# Patient Record
Sex: Male | Born: 1955
Health system: Southern US, Community
[De-identification: ages and names within clinical notes are randomized; demographics above are authoritative.]

## PROBLEM LIST (undated history)

## (undated) DIAGNOSIS — R42 Dizziness and giddiness: Secondary | ICD-10-CM

## (undated) DIAGNOSIS — Z87442 Personal history of urinary calculi: Secondary | ICD-10-CM

## (undated) DIAGNOSIS — M1990 Unspecified osteoarthritis, unspecified site: Secondary | ICD-10-CM

## (undated) DIAGNOSIS — E119 Type 2 diabetes mellitus without complications: Secondary | ICD-10-CM

## (undated) DIAGNOSIS — R011 Cardiac murmur, unspecified: Secondary | ICD-10-CM

## (undated) DIAGNOSIS — Z8719 Personal history of other diseases of the digestive system: Secondary | ICD-10-CM

## (undated) DIAGNOSIS — E785 Hyperlipidemia, unspecified: Secondary | ICD-10-CM

## (undated) DIAGNOSIS — K219 Gastro-esophageal reflux disease without esophagitis: Secondary | ICD-10-CM

## (undated) DIAGNOSIS — I10 Essential (primary) hypertension: Secondary | ICD-10-CM

## (undated) DIAGNOSIS — F419 Anxiety disorder, unspecified: Secondary | ICD-10-CM

## (undated) DIAGNOSIS — G43909 Migraine, unspecified, not intractable, without status migrainosus: Secondary | ICD-10-CM

## (undated) HISTORY — PX: OTHER SURGICAL HISTORY: SHX169

## (undated) HISTORY — PX: COLONOSCOPY: SHX174

## (undated) HISTORY — DX: Migraine, unspecified, not intractable, without status migrainosus: G43.909

## (undated) HISTORY — DX: Essential (primary) hypertension: I10

## (undated) HISTORY — DX: Hyperlipidemia, unspecified: E78.5

## (undated) HISTORY — PX: JOINT REPLACEMENT: SHX530

## (undated) HISTORY — DX: Cardiac murmur, unspecified: R01.1

## (undated) HISTORY — DX: Dizziness and giddiness: R42

---

## 2002-03-22 ENCOUNTER — Ambulatory Visit (HOSPITAL_COMMUNITY): Admission: RE | Admit: 2002-03-22 | Discharge: 2002-03-22 | Payer: Self-pay | Admitting: Internal Medicine

## 2002-03-22 ENCOUNTER — Encounter: Payer: Self-pay | Admitting: Internal Medicine

## 2002-10-15 ENCOUNTER — Encounter: Admission: RE | Admit: 2002-10-15 | Discharge: 2002-10-15 | Payer: Self-pay | Admitting: Internal Medicine

## 2002-10-15 ENCOUNTER — Encounter: Payer: Self-pay | Admitting: Internal Medicine

## 2003-12-27 HISTORY — PX: COLONOSCOPY: SHX174

## 2004-03-12 ENCOUNTER — Encounter: Admission: RE | Admit: 2004-03-12 | Discharge: 2004-03-12 | Payer: Self-pay | Admitting: Internal Medicine

## 2004-10-20 ENCOUNTER — Emergency Department: Payer: Self-pay | Admitting: Emergency Medicine

## 2004-10-21 ENCOUNTER — Encounter: Payer: Self-pay | Admitting: Gastroenterology

## 2006-05-23 ENCOUNTER — Encounter: Admission: RE | Admit: 2006-05-23 | Discharge: 2006-05-23 | Payer: Self-pay | Admitting: Internal Medicine

## 2006-11-07 ENCOUNTER — Encounter: Admission: RE | Admit: 2006-11-07 | Discharge: 2006-11-07 | Payer: Self-pay | Admitting: Internal Medicine

## 2006-12-06 ENCOUNTER — Ambulatory Visit: Payer: Self-pay | Admitting: Gastroenterology

## 2007-11-20 ENCOUNTER — Encounter: Admission: RE | Admit: 2007-11-20 | Discharge: 2007-11-20 | Payer: Self-pay | Admitting: Internal Medicine

## 2007-11-21 ENCOUNTER — Encounter: Admission: RE | Admit: 2007-11-21 | Discharge: 2007-11-21 | Payer: Self-pay | Admitting: Internal Medicine

## 2007-12-27 DIAGNOSIS — I10 Essential (primary) hypertension: Secondary | ICD-10-CM

## 2007-12-27 HISTORY — DX: Essential (primary) hypertension: I10

## 2008-01-30 ENCOUNTER — Ambulatory Visit: Payer: Self-pay | Admitting: Gastroenterology

## 2008-10-17 ENCOUNTER — Ambulatory Visit: Payer: Self-pay | Admitting: Internal Medicine

## 2008-11-26 ENCOUNTER — Telehealth: Payer: Self-pay | Admitting: Gastroenterology

## 2008-11-27 ENCOUNTER — Ambulatory Visit: Payer: Self-pay | Admitting: Gastroenterology

## 2008-11-27 DIAGNOSIS — K5732 Diverticulitis of large intestine without perforation or abscess without bleeding: Secondary | ICD-10-CM | POA: Insufficient documentation

## 2009-01-04 ENCOUNTER — Telehealth: Payer: Self-pay | Admitting: Gastroenterology

## 2009-01-05 ENCOUNTER — Ambulatory Visit: Payer: Self-pay | Admitting: Gastroenterology

## 2009-01-06 ENCOUNTER — Encounter: Payer: Self-pay | Admitting: Gastroenterology

## 2009-01-06 ENCOUNTER — Ambulatory Visit: Payer: Self-pay | Admitting: Cardiovascular Disease

## 2009-01-06 ENCOUNTER — Telehealth: Payer: Self-pay | Admitting: Gastroenterology

## 2009-01-12 ENCOUNTER — Telehealth: Payer: Self-pay | Admitting: Gastroenterology

## 2009-01-28 ENCOUNTER — Ambulatory Visit: Payer: Self-pay | Admitting: Gastroenterology

## 2009-02-03 ENCOUNTER — Encounter: Payer: Self-pay | Admitting: Gastroenterology

## 2009-07-07 ENCOUNTER — Ambulatory Visit: Payer: Self-pay | Admitting: Internal Medicine

## 2009-08-04 ENCOUNTER — Ambulatory Visit: Payer: Self-pay | Admitting: Internal Medicine

## 2010-01-19 ENCOUNTER — Ambulatory Visit: Payer: Self-pay | Admitting: Internal Medicine

## 2010-02-23 ENCOUNTER — Ambulatory Visit: Payer: Self-pay | Admitting: Internal Medicine

## 2010-07-02 ENCOUNTER — Ambulatory Visit: Payer: Self-pay | Admitting: Internal Medicine

## 2010-12-31 ENCOUNTER — Ambulatory Visit
Admission: RE | Admit: 2010-12-31 | Discharge: 2010-12-31 | Payer: Self-pay | Source: Home / Self Care | Attending: Internal Medicine | Admitting: Internal Medicine

## 2011-01-16 ENCOUNTER — Encounter: Payer: Self-pay | Admitting: Gastroenterology

## 2011-01-25 NOTE — Progress Notes (Signed)
Summary: surgeon suggestions   Phone Note Call from Patient Call back at [phone]   Caller: Patient Call For: Arlyce Dice Reason for Call: Talk to Nurse Details for Reason: surgeon suggestions Summary of Call: pt needs list of surgeons Arlyce Dice has suggested at last office visit so that he can start making calls and get something set up Initial call taken by: Vallarie Mare,  January 12, 2009 1:36 PM  Follow-up for Phone Call        What surgeons at [facility] did you want to refer this pt to? Merri Ray CMA  January 12, 2009 2:12 PM   Additional Follow-up for Phone Call Additional follow up Details #1::        Dr. Derrell Lolling or [facility] Additional Follow-up by: Louis Meckel MD,  January 13, 2009 9:51 AM      Appended Document: surgeon suggestions called pt to inform, Pt already made his own appointment for 2/9 . Requested we fax all information needed for his appointment. I will fax all this information today

## 2011-01-25 NOTE — Assessment & Plan Note (Signed)
Summary: DIVERTICULITIS.  F/U FROM TRIAGE BY DR.JACOBS ON 01-04-2009.  ...    History of Present Illness Visit Type: follow up Primary GI MD: Melvia Heaps MD [facility] Primary Provider: Marlan Palau, MD Chief Complaint: Diverticulitis, LLQ pain responsive to hydrocodiene, pt began Flagyl friday night and Cipro yesterday History of Present Illness:   [name] [name] has returned for reevaluation because of abdominal pain.  Approximately 3 days ago he developed moderately severe left lower quadrant pain, typical for his pain associated with acute diverticulitis.  He was treated with antibiotics including Cipro and Flagyl with improvement in his pain.    GI Review of Systems    Reports abdominal pain.     Location of  Abdominal pain: LLQ.    Denies acid reflux, belching, bloating, chest pain, dysphagia with liquids, dysphagia with solids, heartburn, loss of appetite, nausea, vomiting, vomiting blood, weight loss, and  weight gain.      Reports diverticulosis and  irritable bowel syndrome.     Denies anal fissure, black tarry stools, change in bowel habit, constipation, diarrhea, fecal incontinence, heme positive stool, hemorrhoids, jaundice, light color stool, liver problems, rectal bleeding, and  rectal pain.     Prior Medications Reviewed Using: Patient Recall  Updated Prior Medication List: CIPROFLOXACIN HCL 500 MG  TABS (CIPROFLOXACIN HCL) Take 1 twice a day times 10 days METRONIDAZOLE 250 MG  TABS (METRONIDAZOLE) Take 1 twice a day times 10 days LISINOPRIL 20 MG TABS (LISINOPRIL) 1 tablet by mouth once daily  Current Allergies (reviewed today): ! * IVP DYE  Past Medical History:    Reviewed history from 01/30/2008 and no changes required:       Diverticulitis       IBS       Hypertension       Renal Stones  Past Surgical History:    Reviewed history from 11/27/2008 and no changes required:       Unremarkable   Family History:    Family History of Prostate Cancer:Father     Family History of Breast Cancer:Mother  Social History:    Occupation: Self employed    Patient has never smoked.     Alcohol Use - yes rare    Review of Systems  The patient denies allergy/sinus, anemia, anxiety-new, arthritis/joint pain, back pain, blood in urine, breast changes/lumps, change in vision, confusion, cough, coughing up blood, depression-new, fainting, fatigue, fever, headaches-new, hearing problems, heart murmur, heart rhythm changes, itching, menstrual pain, muscle pains/cramps, night sweats, nosebleeds, pregnancy symptoms, shortness of breath, skin rash, sleeping problems, sore throat, swelling of feet/legs, swollen lymph glands, thirst - excessive , urination - excessive , urination changes/pain, urine leakage, vision changes, and voice change.     Vital Signs:  Patient Profile:   55 Years Old Male Height:     72 inches Weight:      233.13 pounds BMI:     31.73 BSA:     2.28 Pulse rate:   92 / minute Pulse rhythm:   regular BP sitting:   148 / 86  (left arm)  Vitals Entered By: Teresita Madura CMA (January 05, 2009 1:39 PM)                  Physical Exam  Is a well-developed well-nourished male  On abdominal exam there is moderate tenderness to deep palpation in the left lower quadrant.  There is no guarding or rebound.  There are no masses    Impression & Recommendations:  Problem #  1:  DIVERTICULITIS-COLON (ICD-562.11) Assessment: Improved Patient is clinically improving from his acute diverticulitis.  He has had recurrent episodes over the past 18 months.  I believe he he is at high risk for recurrent diverticulitis and certainly for complications.  Recommendations #1 continue Cipro and Flagyl for 14 day course #2 patient will consider elective surgical resection vs. enrollment in a diverticulitis prevention trial #3 CT of the abdomen  Other Orders: CT Abdomen/Pelvis with Contrast (CT Abd/Pelvis w/con) Oral Contrast only No IV   Patient Instructions: 1)  cc Sharlet Salina, MD 2)  Your CT is scheduled for 01/06/2009 at 9:00am at [facility] CT

## 2011-01-25 NOTE — Letter (Signed)
Summary: Results Letter  [facility]  [address] [place], [place] [address]   Phone: [phone]  Fax: [phone]        January 06, 2009 MRN: [id number]    [name] [name] 1949 [place] [address] [place], [place]  [address]    Dear [name],  Your CT results demonstrated changes consistent with diverticulitis.  Please continue with the recommendations previously discussed.  Should you have any further questions or immediate concers, feel free to contact me.  Sincerely,  Barbette Hair. Arlyce Dice, M.D., [facility]          Sincerely,  Louis Meckel MD  This letter has been electronically signed by your physician.  Appended Document: Results Letter Reviewed with patient by phone and mailed to him.Pt. instructed to call back as needed.

## 2011-01-25 NOTE — Procedures (Signed)
Summary: Colonoscopy   Colonoscopy  Procedure date:  10/21/2004  Findings:      Results: Diverticulosis.       Location:  [facility].    Procedures Next Due Date:    Colonoscopy: 10/2011 Patient Name: [name] [name], [name] [name] MRN:  Procedure Procedures: Colonoscopy CPT: [address].  Personnel: Endoscopist: Barbette Hair. Arlyce Dice, MD.  Indications  Average Risk Screening Routine.  History  Current Medications: Patient is not currently taking Coumadin.  Pre-Exam Physical: Performed Oct 21, 2004. Entire physical exam was normal.  Exam Exam: Extent of exam reached: Cecum, extent intended: Cecum.  The cecum was identified by IC valve. Colon retroflexion performed. ASA Classification: II. Tolerance: good.  Monitoring: Pulse and BP monitoring, Oximetry used. Supplemental O2 given. at 2 Liters.  Colon Prep Used Miralax for colon prep. Prep results: excellent.  Sedation Meds: Fentanyl 100 mcg. given IV. Versed 7 mg. given IV.  Findings DIVERTICULOSIS: Ascending Colon. ICD9: Diverticulosis, Colon: 562.10.  DIVERTICULOSIS: Ascending Colon. ICD9: Diverticulosis, Colon: 562.10.  - DIVERTICULOSIS: Descending Colon to Sigmoid Colon. ICD9: Diverticulosis, Colon: 562.10. Comments: Multiple diverticula.  NORMAL EXAM: Cecum.  NORMAL EXAM: Rectum.   Assessment Abnormal examination, see findings above.  Diagnoses: 562.10: Diverticulosis, Colon.   Events  Unplanned Interventions: No intervention was required.  Unplanned Events: There were no complications. Plans Patient Education: Patient given standard instructions for: Diverticulosis.  Scheduling/Referral: Colonoscopy, to Barbette Hair. Arlyce Dice, MD, around Oct 22, 2011.       CC: Marlan Palau, MD  This report was created from the original endoscopy report, which was reviewed and signed by the above listed endoscopist.

## 2011-01-25 NOTE — Progress Notes (Signed)
Summary: Diverticulitis flare up   Phone Note Call from Patient Call back at Work Phone [phone]   Call For: DR Arlyce Dice Reason for Call: Talk to Nurse Summary of Call: 3 or 4days getting steady Diverticulitis flare up. Very tender and sore. Would like appt to be seen asap Initial call taken by: Leanor Kail [facility],  November 26, 2008 2:45 PM  Follow-up for Phone Call        LM for patient to return call. Paulene Floor, RN  November 26, 2008 2:59 PM. Sherron Monday with patient  c/o lower abdominal pain,  patient had a flare up 2-3 weeks ago and was on antibiotic therapy for diverticulitis.  Did fell better up till 4 days ago.  Denies fever, blood in stool, mucus.  pain is below the belly button midline and sharp, also c/o nausea.   Patient given appointment to see NP tomorrow at 830 am.  Will bring med list insurance card and copay.  Follow-up by: Paulene Floor, RN,  November 26, 2008 3:12 PM

## 2011-01-25 NOTE — Progress Notes (Signed)
  Medications Added CIPROFLOXACIN HCL 500 MG  TABS (CIPROFLOXACIN HCL) Take 1 twice a day times 10 days METRONIDAZOLE 250 MG  TABS (METRONIDAZOLE) Take 1 twice a day times 10 days       Phone Note Call from Patient   Caller: Patient Summary of Call: he called today, having llq pains that have been attributed to diverticulitis (several bouts in pat 2-3 years).  I will call in cipro/flagyl.  He has not yet heard about general surgery referral, but knows that he will likelycontinue to have repeat infections unless the diseased segment is resected.  Our office will contact him tomorrow about CT scan (iv and oral conttrast) abd/pelvis to document diverticulitis.  He should also have CBC as well. He will need to be re-referred to general srugery to consider segmental colectomy Initial call taken by: Rachael Fee MD,  January 04, 2009 12:27 PM    New/Updated Medications: CIPROFLOXACIN HCL 500 MG  TABS (CIPROFLOXACIN HCL) Take 1 twice a day times 10 days METRONIDAZOLE 250 MG  TABS (METRONIDAZOLE) Take 1 twice a day times 10 days   Prescriptions: METRONIDAZOLE 250 MG  TABS (METRONIDAZOLE) Take 1 twice a day times 10 days  #30 x 0   Entered and Authorized by:   Rachael Fee MD   Signed by:   Rachael Fee MD on 01/04/2009   Method used:   Electronically to        [facility] [address] [address]* (retail)       [address]       [place], [place]  [address]       Ph: [phone]       Fax: [phone]   RxID:   [id number] CIPROFLOXACIN HCL 500 MG  TABS (CIPROFLOXACIN HCL) Take 1 twice a day times 10 days  #20 x 0   Entered and Authorized by:   Rachael Fee MD   Signed by:   Rachael Fee MD on 01/04/2009   Method used:   Electronically to        [facility] [address] [address]* (retail)       [address]       [place], [place]  [address]       Ph: [phone]       Fax: [phone]   RxID:   [id number]   Appended Document:  Gavin Pound,   can you see the above orders.  I already sent in for his meds  Appended Document:  PER DR.KAPLAN-Hold off on above orders and have pt. come to office today. Pt. will be seen at 1:30pm.

## 2011-01-25 NOTE — Progress Notes (Signed)
Summary: new meds.  Medications Added TYLENOL WITH CODEINE #3 300-30 MG TABS (ACETAMINOPHEN-CODEINE) Take 2 tabs. by mouth every 4-6 hours as needed for pain. LEVSIN/SL 0.125 MG  SUBL (HYOSCYAMINE SULFATE) Take 1-2 SL every 4-6 hours as needed       ---- Converted from flag ---- ---- 01/06/2009 11:57 AM, Louis Meckel MD wrote: tylenol #3 2 tabs q4-6h as needed  #25 ---- 01/06/2009 11:38 AM, Laureen Ochs LPN wrote: Dr.Javona Bergevin--Mr.Jakubiak wants something for pain, what can he have? He is feeling a bit better, but he states he needs something to help with the pain. Deb ------------------------------       New/Updated Medications: TYLENOL WITH CODEINE #3 300-30 MG TABS (ACETAMINOPHEN-CODEINE) Take 2 tabs. by mouth every 4-6 hours as needed for pain. LEVSIN/SL 0.125 MG  SUBL (HYOSCYAMINE SULFATE) Take 1-2 SL every 4-6 hours as needed   Prescriptions: LEVSIN/SL 0.125 MG  SUBL (HYOSCYAMINE SULFATE) Take 1-2 SL every 4-6 hours as needed  #30 x 1   Entered by:   Laureen Ochs LPN   Authorized by:   Louis Meckel MD   Signed by:   Laureen Ochs LPN on 45/40/9811   Method used:   Telephoned to ...       [facility] [address] [address]* (retail)       [address]       [place], [place]  [address]       Ph: [phone]       Fax: [phone]   RxID:   [id number] TYLENOL WITH CODEINE #3 300-30 MG TABS (ACETAMINOPHEN-CODEINE) Take 2 tabs. by mouth every 4-6 hours as needed for pain.  #30 x 0   Entered by:   Laureen Ochs LPN   Authorized by:   Louis Meckel MD   Signed by:   Laureen Ochs LPN on 66/44/0347   Method used:   Telephoned to ...       [facility] [address] [address] (retail)       [address]       [place], [place]  [address]       Ph: [phone]       Fax: [phone]   RxID:   [id number]

## 2011-02-28 ENCOUNTER — Ambulatory Visit (INDEPENDENT_AMBULATORY_CARE_PROVIDER_SITE_OTHER): Payer: 59 | Admitting: Internal Medicine

## 2011-02-28 ENCOUNTER — Other Ambulatory Visit: Payer: Self-pay | Admitting: Internal Medicine

## 2011-02-28 DIAGNOSIS — E119 Type 2 diabetes mellitus without complications: Secondary | ICD-10-CM

## 2011-02-28 DIAGNOSIS — Z23 Encounter for immunization: Secondary | ICD-10-CM

## 2011-02-28 DIAGNOSIS — M5410 Radiculopathy, site unspecified: Secondary | ICD-10-CM

## 2011-02-28 DIAGNOSIS — E785 Hyperlipidemia, unspecified: Secondary | ICD-10-CM

## 2011-02-28 DIAGNOSIS — K589 Irritable bowel syndrome without diarrhea: Secondary | ICD-10-CM

## 2011-03-04 ENCOUNTER — Ambulatory Visit (INDEPENDENT_AMBULATORY_CARE_PROVIDER_SITE_OTHER): Payer: 59 | Admitting: Internal Medicine

## 2011-03-04 DIAGNOSIS — J069 Acute upper respiratory infection, unspecified: Secondary | ICD-10-CM

## 2011-03-07 ENCOUNTER — Ambulatory Visit
Admission: RE | Admit: 2011-03-07 | Discharge: 2011-03-07 | Disposition: A | Discharge status: Home / Self Care | Payer: 59 | Source: Ambulatory Visit | Attending: Internal Medicine | Admitting: Internal Medicine

## 2011-03-07 DIAGNOSIS — M5410 Radiculopathy, site unspecified: Secondary | ICD-10-CM

## 2011-05-09 ENCOUNTER — Other Ambulatory Visit: Payer: Self-pay | Admitting: *Deleted

## 2011-05-09 DIAGNOSIS — F419 Anxiety disorder, unspecified: Secondary | ICD-10-CM

## 2011-05-09 MED ORDER — ALPRAZOLAM 0.5 MG PO TABS: 0.5000 mg | ORAL_TABLET | Freq: Two times a day (BID) | ORAL | Status: DC

## 2011-05-10 NOTE — Assessment & Plan Note (Signed)
 HEALTHCARE                         GASTROENTEROLOGY OFFICE NOTE   NAME:[name], [name] [name]                       MRN:          [id number]  DATE:01/30/2008                            DOB:          Apr 12, 1956    PROBLEM:  Abdominal pain.   [name] has returned for re-evaluation.  Approximately five weeks  ago, he developed left lower quadrant pain, reminiscent of his previous  diverticulitis.  On Cipro, severe pain subsided.  Since that time, he  has had intermittent mild, achy left lower quadrant pain.  It is  intermittent.  It may be sore to the touch.  He has had no fever or  bleeding.  He continues to have loose stool that is well-controlled with  daily Imodium.   PHYSICAL EXAMINATION:  Pulse 80, blood pressure 120/90, weight 239.  HEENT: EOMI.  PERRLA.  Sclerae are anicteric.  Conjunctivae are pink.  NECK:  Supple without thyromegaly, adenopathy or carotid bruits.  CHEST:  Clear to auscultation and percussion without adventitious  sounds.  CARDIAC:  Regular rhythm; normal S1 S2.  There are no murmurs, gallops  or rubs.  ABDOMEN:  He has minimal left lower quadrant tenderness without guarding  or rebound.  There are no abdominal masses or organomegaly.  EXTREMITIES:  Full range of motion.  No cyanosis, clubbing or edema.  RECTAL:  Deferred.   IMPRESSION:  1. Recent history of recurrent acute diverticulitis.  2. Left lower quadrant pain.  He may have mild residual inflammation      or symptoms related to IBS.  3. Diarrhea-predominant IBS.   RECOMMENDATION:  1. NuLev 0.25 mg p.r.n.  2. Fiber supplementation.     Barbette Hair. Arlyce Dice, MD,FACG  Electronically Signed    RDK/MedQ  DD: 01/30/2008  DT: 01/30/2008  Job #: [id number]   cc:   Luanna Cole. Lenord Fellers, M.D.

## 2011-05-13 NOTE — Assessment & Plan Note (Signed)
[name] [name]                         GASTROENTEROLOGY OFFICE NOTE   NAME:[name], [name] VOORHIES                       MRN:          [id number]  DATE:12/06/2006                            DOB:          [birth year]    PROBLEM:  Abdominal  pain.   HISTORY:  [name] [name] is a 55 year old white male referred through the  courtesy of Dr. Lenord Fellers for evaluation.  Approximately a month ago, he  developed left lower quadrant pain for which he was started on Cipro and  Flagyl for presumed diverticulitis.  On November 13th, a CT scan  demonstrated some haziness around the sigmoid colon, consistent with  mild sigmoid diverticulitis.  Symptoms subsided with a 10-day course of  antibiotics.  They recurred approximately 12 days ago.  They have again  subsided with resumption of his antibiotics.  He has known  diverticulosis demonstrated by colonoscopy in October 2005.  As a  baseline, [name] [name] has diarrhea consisting of 4-5 loose bowel  movements a day.  This is accompanied by urgency and is usually  postprandial.  He has had symptoms for at least 5-6 years.  During his  recent bouts with diverticulitis, stools have actually become more firm.  He also complains of mild intermittent pyrosis.   PAST MEDICAL HISTORY:  Pertinent for hypertension.  He has a history of  renal stones.   FAMILY HISTORY:  Pertinent for a father with prostate cancer.   MEDICATIONS:  His only medication is an anti-hypertensive.   ALLERGIES:  HE HAS NO ALLERGIES.   SOCIAL HISTORY:  He does not smoke.  He drinks rarely.  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   REVIEW OF SYSTEMS:  Positive for a frequent, non-productive cough and  some pressure over his bladder that relieved with urination, coincident  with his current abdominal pain.   PHYSICAL EXAMINATION:  VITAL SIGNS:  Pulse 78, blood pressure 130/90,  weight 223.  HEENT:  EOMI. PERRLA. Sclerae are anicteric.  Conjunctivae are  pink.  NECK:  Supple without thyromegaly, adenopathy or carotid bruits.  CHEST:  Clear to auscultation and percussion without adventitious  sounds.  CARDIAC:  Regular rhythm; normal S1 S2.  There are no murmurs, gallops  or rubs.  ABDOMEN:  There is minimal left lower quadrant and suprapubic tenderness  without guarding or rebound.  There are no abdominal masses or  organomegaly.  EXTREMITIES:  Full range of motion.  No cyanosis, clubbing or edema.  RECTAL:  There are no masses.  Stool is Hemoccult negative.   IMPRESSION:  1. Resolving acute diverticulitis.  I suspect that the latter episode      probably represents a smoldering low-grade diverticulitis related      to his event from 1 month ago.  2. Chronic diarrhea.  This could be due to IBS.  Microscopic colitis      is also a consideration.   RECOMMENDATION:  1. Complete a 14-day course of Flagyl and Cipro.  2. Check CBC and CMET.  3. Trial of Imodium 1 to 2 tabs q.a.m. and then q.6 h. p.r.n.  for his      chronic diarrhea.   I will re-evaluate [name] [name] in approximately 6 weeks, but instructed  him to call if he develops recurrent abdominal pain.     Barbette Hair. Arlyce Dice, MD,FACG  Electronically Signed    RDK/MedQ  DD: 12/06/2006  DT: 12/06/2006  Job #: [id number]   cc:   Luanna Cole. Lenord Fellers, M.D.

## 2011-06-06 ENCOUNTER — Encounter: Payer: Self-pay | Admitting: Internal Medicine

## 2011-06-06 ENCOUNTER — Ambulatory Visit (INDEPENDENT_AMBULATORY_CARE_PROVIDER_SITE_OTHER): Payer: 59 | Admitting: Internal Medicine

## 2011-06-06 VITALS — BP 124/87 | HR 82 | Temp 97.3°F | Ht 71.0 in | Wt 219.0 lb

## 2011-06-06 DIAGNOSIS — I10 Essential (primary) hypertension: Secondary | ICD-10-CM

## 2011-06-06 DIAGNOSIS — K589 Irritable bowel syndrome without diarrhea: Secondary | ICD-10-CM

## 2011-06-06 DIAGNOSIS — E119 Type 2 diabetes mellitus without complications: Secondary | ICD-10-CM

## 2011-06-06 DIAGNOSIS — Z23 Encounter for immunization: Secondary | ICD-10-CM

## 2011-06-06 DIAGNOSIS — E785 Hyperlipidemia, unspecified: Secondary | ICD-10-CM

## 2011-06-06 LAB — HEMOGLOBIN A1C
Hgb A1c MFr Bld: 5.5 % (ref <5.7)
Mean Plasma Glucose: 111 mg/dL (ref <117)

## 2011-06-06 MED ORDER — PNEUMOCOCCAL VAC POLYVALENT 25 MCG/0.5ML IJ INJ
0.5000 mL | INJECTION | Freq: Once | INTRAMUSCULAR | Status: AC
  Administered 2011-06-06: 0.5 mL via INTRAMUSCULAR

## 2011-06-07 ENCOUNTER — Encounter: Payer: Self-pay | Admitting: Internal Medicine

## 2011-06-07 DIAGNOSIS — K589 Irritable bowel syndrome without diarrhea: Secondary | ICD-10-CM | POA: Insufficient documentation

## 2011-06-07 DIAGNOSIS — I10 Essential (primary) hypertension: Secondary | ICD-10-CM | POA: Insufficient documentation

## 2011-06-07 NOTE — Patient Instructions (Signed)
Continue BP meds. Continue diet exercise and weight loss. Return in 4 months

## 2011-06-07 NOTE — Progress Notes (Signed)
  Subjective:    Patient ID: [name] [name], male    DOB: 01/10/1956, 55 y.o.   MRN: [id number]  HPI here today to followup on hypertension, diabetes mellitus, irritable bowel syndrome. Has lost approximately 24 1/2 pounds just by changing dietary habits. Was exercising a bit more until recently. Work is going well. Less stressful than when I last saw him.    Review of Systems     Objective:   Physical Exam patient has decreased sensation and paresthesias lower legs bilaterally. Chest clear no carotid bruits cardiac exam regular rate and rhythm, normal S1/S2        Assessment & Plan:  1-diabetes mellitus diet controlled. Hemoglobin A1c pending  2-hypertension stable on current regimen   3-irritable bowel syndrome -stable and improved  Plan return in 4 months at which time he'll have  complete fasting lab work   Pneumovax given

## 2011-09-12 ENCOUNTER — Telehealth: Payer: Self-pay | Admitting: Internal Medicine

## 2011-09-12 NOTE — Telephone Encounter (Signed)
Rx called in per MD 

## 2011-09-12 NOTE — Telephone Encounter (Signed)
Please call in Ambien 10 mg (#30) 1/2-1 po qhs no refill

## 2011-11-01 ENCOUNTER — Encounter: Payer: Self-pay | Admitting: Gastroenterology

## 2011-12-02 ENCOUNTER — Telehealth: Payer: Self-pay

## 2011-12-02 ENCOUNTER — Encounter: Payer: Self-pay | Admitting: Internal Medicine

## 2011-12-02 NOTE — Telephone Encounter (Signed)
Patient calls this am with reports of elevated BP readings at home-155/111 and 160/112. Not sure that his BP cuff is accurate. Also states he's been having some left sided chest pain off and on, sometimes exertional sometimes at rest. Left hand tingly when this pain occurs. No shortness of breath or dyspnea. No nausea or vomiting, jaw or shoulder pain. Advised evaluation at ED or at least an Urgent Care.he is agreeable, although he wants to be seen Dr. Lenord Fellers when she is back in the office on Monday. Appointment made

## 2011-12-05 ENCOUNTER — Encounter: Payer: Self-pay | Admitting: Internal Medicine

## 2011-12-05 ENCOUNTER — Ambulatory Visit (INDEPENDENT_AMBULATORY_CARE_PROVIDER_SITE_OTHER): Payer: 59 | Admitting: Internal Medicine

## 2011-12-05 VITALS — BP 136/92 | HR 76 | Temp 97.9°F | Wt 216.0 lb

## 2011-12-05 DIAGNOSIS — Z0000 Encounter for general adult medical examination without abnormal findings: Secondary | ICD-10-CM

## 2011-12-05 DIAGNOSIS — Z79899 Other long term (current) drug therapy: Secondary | ICD-10-CM

## 2011-12-05 DIAGNOSIS — R7301 Impaired fasting glucose: Secondary | ICD-10-CM

## 2011-12-05 DIAGNOSIS — Z23 Encounter for immunization: Secondary | ICD-10-CM

## 2011-12-05 DIAGNOSIS — E7800 Pure hypercholesterolemia, unspecified: Secondary | ICD-10-CM

## 2011-12-05 DIAGNOSIS — R079 Chest pain, unspecified: Secondary | ICD-10-CM

## 2011-12-06 ENCOUNTER — Encounter: Payer: Self-pay | Admitting: Internal Medicine

## 2011-12-06 LAB — HEPATIC FUNCTION PANEL
ALT: 23 U/L (ref 0–53)
AST: 21 U/L (ref 0–37)
Albumin: 4.7 g/dL (ref 3.5–5.2)
Alkaline Phosphatase: 55 U/L (ref 39–117)
Bilirubin, Direct: 0.2 mg/dL (ref 0.0–0.3)
Indirect Bilirubin: 0.7 mg/dL (ref 0.0–0.9)
Total Bilirubin: 0.9 mg/dL (ref 0.3–1.2)
Total Protein: 7.3 g/dL (ref 6.0–8.3)

## 2011-12-06 LAB — LIPID PANEL
Cholesterol: 172 mg/dL (ref 0–200)
HDL: 51 mg/dL (ref >39)
LDL Cholesterol: 89 mg/dL (ref 0–99)
Total CHOL/HDL Ratio: 3.4 Ratio
Triglycerides: 160 mg/dL — ABNORMAL HIGH (ref <150)
VLDL: 32 mg/dL (ref 0–40)

## 2011-12-06 LAB — HEMOGLOBIN A1C
Hgb A1c MFr Bld: 5.5 % (ref <5.7)
Mean Plasma Glucose: 111 mg/dL (ref <117)

## 2011-12-06 LAB — TESTOSTERONE: Testosterone: 409.33 ng/dL (ref 250–890)

## 2011-12-06 NOTE — Progress Notes (Signed)
Patient scheduled for an appointment with Dr. Charlton Haws on Dec. 12, 2012 at 11;30 am. Informed of this today.

## 2011-12-07 ENCOUNTER — Ambulatory Visit (INDEPENDENT_AMBULATORY_CARE_PROVIDER_SITE_OTHER): Payer: 59 | Admitting: Cardiovascular Disease

## 2011-12-07 ENCOUNTER — Encounter: Payer: Self-pay | Admitting: Cardiovascular Disease

## 2011-12-07 DIAGNOSIS — I10 Essential (primary) hypertension: Secondary | ICD-10-CM

## 2011-12-07 DIAGNOSIS — R079 Chest pain, unspecified: Secondary | ICD-10-CM

## 2011-12-07 DIAGNOSIS — E782 Mixed hyperlipidemia: Secondary | ICD-10-CM

## 2011-12-07 DIAGNOSIS — E119 Type 2 diabetes mellitus without complications: Secondary | ICD-10-CM

## 2011-12-07 NOTE — Patient Instructions (Signed)
Your physician recommends that you schedule a follow-up with Dr. Eden Emms as needed.  Your physician has requested that you have an exercise tolerance test. For further information please visit [email or web address] Please also follow instruction sheet, as given.

## 2011-12-07 NOTE — Assessment & Plan Note (Signed)
Cholesterol is at goal.  Continue current dose of statin and diet Rx.  No myalgias or side effects.  F/U  LFT's in 6 months. Lab Results  Component Value Date   LDLCALC 89 12/05/2011

## 2011-12-07 NOTE — Assessment & Plan Note (Signed)
Well controlled.  Continue current medications and low sodium Dash type diet.    

## 2011-12-07 NOTE — Assessment & Plan Note (Signed)
Atypical Multiple CRF;s  F/U ETT

## 2011-12-07 NOTE — Progress Notes (Signed)
55 yo referred by Dr Lenord Fellers.  Atypical chest pain last few weeks.  He thinks it is stress.  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  Has had previous ETT years ago that was normal.  Pain is nonexertional, sharp, fleeting and muscular sounding.  Persistant last few weeks but not worsening  Reviewed ECG from 12/7 at primary office and it was normal.  No pleuritic component, no recent trauma and no recent inflamatory illness  ROS: Denies fever, malais, weight loss, blurry vision, decreased visual acuity, cough, sputum, SOB, hemoptysis, pleuritic pain, palpitaitons, heartburn, abdominal pain, melena, lower extremity edema, claudication, or rash.  All other systems reviewed and negative  General: Affect appropriate Healthy:  appears stated age HEENT: normal Neck supple with no adenopathy JVP normal no bruits no thyromegaly Lungs clear with no wheezing and good diaphragmatic motion Heart:  S1/S2 no murmur,rub, gallop or click PMI normal Abdomen: benighn, BS positve, no tenderness, no AAA no bruit.  No HSM or HJR Distal pulses intact with no bruits No edema Neuro non-focal Skin warm and dry No muscular weakness   Current Outpatient Prescriptions  Medication Sig Dispense Refill  . ALPRAZolam (XANAX) 0.5 MG tablet Take 1 tablet (0.5 mg total) by mouth 2 (two) times daily. Take one tab q am and two tabs q hs  90 tablet  5  . fluticasone (VERAMYST) 27.5 MCG/SPRAY nasal spray Place 2 sprays into the nose daily.        Marland Kitchen losartan-hydrochlorothiazide (HYZAAR) 100-25 MG per tablet Take 1 tablet by mouth daily.        . rosuvastatin (CRESTOR) 10 MG tablet Take 10 mg by mouth daily.          Allergies  Iohexol  Electrocardiogram:  12/02/11  Normal sinus rhythm normal ECG  Assessment and Plan

## 2011-12-07 NOTE — Assessment & Plan Note (Signed)
Discussed low carb diet.  Target hemoglobin A1c is 6.5 or less.  Continue current medications.  

## 2011-12-26 NOTE — Patient Instructions (Signed)
We will refer you to cardiology for further evaluation. Please try the diet exercise and lose weight. Please try to control stress.

## 2012-01-02 ENCOUNTER — Other Ambulatory Visit: Payer: Self-pay

## 2012-01-02 MED ORDER — ESOMEPRAZOLE MAGNESIUM 40 MG PO CPDR: 40.0000 mg | DELAYED_RELEASE_CAPSULE | Freq: Every day | ORAL | Status: DC

## 2012-01-03 ENCOUNTER — Ambulatory Visit (INDEPENDENT_AMBULATORY_CARE_PROVIDER_SITE_OTHER): Payer: 59 | Admitting: Cardiovascular Disease

## 2012-01-03 DIAGNOSIS — R079 Chest pain, unspecified: Secondary | ICD-10-CM

## 2012-01-03 NOTE — Progress Notes (Signed)
Exercise Treadmill Test  Pre-Exercise Testing Evaluation Rhythm: normal sinus  Rate: 67   PR:  .14 QRS:  .09  QT:  .40 QTc: .42     Test  Exercise Tolerance Test Ordering MD: Charlton Haws, MD  Interpreting MD:  Charlton Haws, MD  Unique Test No: 1  Treadmill:  1  Indication for ETT: chest pain - rule out ischemia  Contraindication to ETT: No   Stress Modality: exercise - treadmill  Cardiac Imaging Performed: non   Protocol: standard Bruce - maximal  Max BP:  191 81/  Max MPHR (bpm):  163 85% MPR (bpm):  138  MPHR obtained (bpm):  169 % MPHR obtained:  102  Reached 85% MPHR (min:sec):4:00 Total Exercise Time (min-sec):  7:00  Workload in METS:  11.7 Borg Scale: 20  Reason ETT Terminated:  fatigue    ST Segment Analysis At Rest: normal ST segments - no evidence of significant ST depression With Exercise: no evidence of significant ST depression  Other Information Arrhythmia:  No Angina during ETT:  absent (0) Quality of ETT:  diagnostic  ETT Interpretation:  normal - no evidence of ischemia by ST analysis  Comments: Normal hemodynamic response  Occasional PVC in recovery  No ischemia Stages advanced at 2:00 minute intervals  Recommendations: Continue Rx for BP and cholesterol.  Continue exercise program weight loss and low carb diet F/U Dr Baird Kay

## 2012-02-13 ENCOUNTER — Other Ambulatory Visit: Payer: Self-pay

## 2012-02-13 DIAGNOSIS — F419 Anxiety disorder, unspecified: Secondary | ICD-10-CM

## 2012-02-13 MED ORDER — ALPRAZOLAM 0.5 MG PO TABS: 0.5000 mg | ORAL_TABLET | Freq: Two times a day (BID) | ORAL | Status: DC

## 2012-02-29 ENCOUNTER — Encounter: Payer: Self-pay | Admitting: Internal Medicine

## 2012-02-29 NOTE — Progress Notes (Signed)
  Subjective:    Patient ID: [name] [name], male    DOB: 05/14/1956, 56 y.o.   MRN: [id number]  HPI 56 year old white male presents with chest pain. 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 Admits that he is under a great deal of stress. History of hypertension, anxiety, irritable bowel syndrome, recurrent diverticulitis. Is history of hyperlipidemia. Had Cardiolite study October 2003 which was negative. Had colonoscopy 2005 showing diverticulosis.  Social history: does not smoke. Social alcohol consumption. Lives with long-time girlfriend. Divorced. Has one son from his only marriage.    Review of Systems doesn't really exercise on a regular basis. Rides horses some. Has been able to get glucose under control with diet. No radiation of pain into his neck. Mainly substernal discomfort. Has taken Nexium off and on since 2003. Chest pain in 2003 was thought to be left chest wall pain.  Family history father died of prostate cancer. Mother died with dementia with apparent history of alcoholism. One brother.     Objective:   Physical Exam no JVD thyromegaly or carotid bruits; chest clear to auscultation; cardiac exam regular rate and rhythm normal S1 and S2; extremities without edema.        Assessment & Plan:  Chest pain  Hypertension  Hyperlipidemia  Adult onset diabetes mellitus-diet controlled  Plan: Patient will be referred to cardiologist for further evaluation. He is anxious about the chest pain at this point.

## 2012-03-26 ENCOUNTER — Other Ambulatory Visit: Payer: Self-pay

## 2012-03-26 MED ORDER — FLUTICASONE FUROATE 27.5 MCG/SPRAY NA SUSP: 2.0000 | Freq: Every day | NASAL | Status: DC

## 2012-05-02 ENCOUNTER — Other Ambulatory Visit: Payer: Self-pay

## 2012-05-02 MED ORDER — LOSARTAN POTASSIUM-HCTZ 100-25 MG PO TABS: 1.0000 | ORAL_TABLET | Freq: Every day | ORAL | Status: DC

## 2012-09-18 ENCOUNTER — Other Ambulatory Visit: Payer: Self-pay

## 2012-09-18 DIAGNOSIS — F419 Anxiety disorder, unspecified: Secondary | ICD-10-CM

## 2012-09-18 MED ORDER — ALPRAZOLAM 0.5 MG PO TABS: 0.5000 mg | ORAL_TABLET | Freq: Two times a day (BID) | ORAL | Status: DC

## 2012-10-11 ENCOUNTER — Ambulatory Visit (INDEPENDENT_AMBULATORY_CARE_PROVIDER_SITE_OTHER): Payer: 59 | Admitting: Internal Medicine

## 2012-10-11 VITALS — Temp 98.0°F

## 2012-10-11 DIAGNOSIS — Z23 Encounter for immunization: Secondary | ICD-10-CM

## 2012-10-11 DIAGNOSIS — J069 Acute upper respiratory infection, unspecified: Secondary | ICD-10-CM

## 2012-10-12 DIAGNOSIS — Z23 Encounter for immunization: Secondary | ICD-10-CM

## 2012-10-22 ENCOUNTER — Encounter: Payer: Self-pay | Admitting: Internal Medicine

## 2012-10-22 NOTE — Patient Instructions (Addendum)
Take Levaquin 500 mg daily for 7 days. Take Hycodan sparingly for cough.

## 2012-10-22 NOTE — Progress Notes (Signed)
  Subjective:    Patient ID: [name] [name], male    DOB: 03/22/1956, 56 y.o.   MRN: [id number]  HPI 56 year old white male with history of hypertension in today with respiratory infection. Has been to [place] to the [employer] and came down with respiratory infection. Sounds nasally congested and hoarse. No fever or shaking chills. No sputum production.    Review of Systems     Objective:   Physical Exam HEENT exam: TMs are clear; pharynx slightly injected without exudate; neck is supple without significant adenopathy; chest clear; skin warm and dry        Assessment & Plan:  URI  Plan: Levaquin 500 milligrams daily for 7 days. Hycodan 8 ounces 1 teaspoon by mouth every 6 hours when necessary cough.

## 2012-11-01 ENCOUNTER — Other Ambulatory Visit: Payer: Self-pay | Admitting: Internal Medicine

## 2013-03-04 ENCOUNTER — Other Ambulatory Visit: Payer: Self-pay | Admitting: Internal Medicine

## 2013-03-25 ENCOUNTER — Telehealth: Payer: Self-pay | Admitting: Internal Medicine

## 2013-03-25 NOTE — Telephone Encounter (Signed)
Spoke with patient to advise we need copy of the report.  He was out of town when this happened.  He'll call and get reports faxed to [place].  Once we receive report, will make appointment for patient to see MJB.  Patient advised and understands plan of action.

## 2013-03-25 NOTE — Telephone Encounter (Signed)
MRIs are not usually needed for rib fracture. Can he have report sent here and we can see tomorrow?

## 2013-03-26 NOTE — Telephone Encounter (Signed)
Patient called to see if we received electronic reports on his visit last week to out of town urgent care.  Advised we have nothing.  Patient was seen by [facility] @ [phone] [address] in [place], [place].  Spoke with Misty Stanley @ [phone] to request documentation.  Advised they are mailing the information and x-rays.  Advised patient to be seen on Thursday.  Requested they fax notes from the visit.  Awaiting notes at the moment.  Spoke w/patient to advise of appt on Thursday, 4/3 @ 1200.  Patient verbalizes understanding of appt time.

## 2013-03-28 ENCOUNTER — Encounter: Payer: Self-pay | Admitting: Internal Medicine

## 2013-03-28 ENCOUNTER — Ambulatory Visit
Admission: RE | Admit: 2013-03-28 | Discharge: 2013-03-28 | Disposition: A | Discharge status: Home / Self Care | Payer: 59 | Source: Ambulatory Visit | Attending: Internal Medicine | Admitting: Internal Medicine

## 2013-03-28 ENCOUNTER — Ambulatory Visit (INDEPENDENT_AMBULATORY_CARE_PROVIDER_SITE_OTHER): Payer: 59 | Admitting: Internal Medicine

## 2013-03-28 VITALS — BP 126/82 | HR 80 | Temp 98.3°F | Wt 216.0 lb

## 2013-03-28 DIAGNOSIS — K219 Gastro-esophageal reflux disease without esophagitis: Secondary | ICD-10-CM

## 2013-03-28 DIAGNOSIS — S40021A Contusion of right upper arm, initial encounter: Secondary | ICD-10-CM

## 2013-03-28 DIAGNOSIS — M25512 Pain in left shoulder: Secondary | ICD-10-CM

## 2013-03-28 DIAGNOSIS — M898X1 Other specified disorders of bone, shoulder: Secondary | ICD-10-CM

## 2013-03-28 DIAGNOSIS — R079 Chest pain, unspecified: Secondary | ICD-10-CM

## 2013-03-28 DIAGNOSIS — M25519 Pain in unspecified shoulder: Secondary | ICD-10-CM

## 2013-03-28 DIAGNOSIS — R0781 Pleurodynia: Secondary | ICD-10-CM

## 2013-03-28 DIAGNOSIS — M899 Disorder of bone, unspecified: Secondary | ICD-10-CM

## 2013-03-28 DIAGNOSIS — M949 Disorder of cartilage, unspecified: Secondary | ICD-10-CM

## 2013-03-28 MED ORDER — TRAMADOL HCL 50 MG PO TABS: 50.0000 mg | ORAL_TABLET | Freq: Four times a day (QID) | ORAL | Status: DC | PRN

## 2013-03-28 MED ORDER — CYCLOBENZAPRINE HCL 10 MG PO TABS: 10.0000 mg | ORAL_TABLET | Freq: Three times a day (TID) | ORAL | Status: DC | PRN

## 2013-03-28 NOTE — Progress Notes (Signed)
  Subjective:    Patient ID: [name] [name], male    DOB: 03/31/1956, 57 y.o.   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 Horse cut suddenly to the left. Patient lost his balance. Had his foot deep in the stirrup and was afraid he might get tangled up so he bailed off of  horse striking his right arm, left shoulder and right hip. He went to an urgent care center where he had x-rays of the chest and hip. He was told he had a fractured left third rib anteriorly. He does have tenderness in that area. Continues to have pain in his left anterior chest and left scapula. It is not bothering him at the present time. He was placed on Flexeril, tramadol, hydrocodone/APAP 7.5/325. However he has a competition in [place] in about 3 weeks and he is anxious to start riding again.    Review of Systems     Objective:   Physical Exam tender left anterior rib cage area. Chest is clear to auscultation without rales or friction rub. Decreased range of motion left upper extremity due to pain. Tender over left scapula.        Assessment & Plan:  Musculoskeletal injuries secondary to horseback riding accident  Rule out fractured ribs  Plan: Patient is to have left rib detail films and x-ray of left scapula. Continue Flexeril and hydrocodone /APAP for pain. Await x-ray results.  Addendum: X-rays are negative

## 2013-04-05 ENCOUNTER — Telehealth: Payer: Self-pay

## 2013-04-05 MED ORDER — AZITHROMYCIN 250 MG PO TABS: ORAL_TABLET | ORAL | Status: DC

## 2013-04-05 NOTE — Telephone Encounter (Signed)
Patient calls to report he has an URI with cough, chest congestion, ears stuffy. No fever or chills.cough is productive. Requesting antibiotic, as he is going out of town and can't come in. OK per Dr. Lenord Fellers for a Z Pak, 1 as directed 0 refills. States he has some leftover cough syrup from a previous URI that he can use.

## 2013-04-30 ENCOUNTER — Other Ambulatory Visit: Payer: Self-pay | Admitting: Internal Medicine

## 2013-05-28 ENCOUNTER — Other Ambulatory Visit: Payer: Self-pay

## 2013-05-28 DIAGNOSIS — F419 Anxiety disorder, unspecified: Secondary | ICD-10-CM

## 2013-05-28 MED ORDER — ALPRAZOLAM 0.5 MG PO TABS: 0.5000 mg | ORAL_TABLET | Freq: Two times a day (BID) | ORAL | Status: DC

## 2013-09-02 ENCOUNTER — Other Ambulatory Visit: Payer: Self-pay

## 2013-09-02 DIAGNOSIS — F419 Anxiety disorder, unspecified: Secondary | ICD-10-CM

## 2013-09-02 MED ORDER — ALPRAZOLAM 0.5 MG PO TABS: 0.5000 mg | ORAL_TABLET | Freq: Two times a day (BID) | ORAL | Status: DC

## 2013-09-16 DIAGNOSIS — K219 Gastro-esophageal reflux disease without esophagitis: Secondary | ICD-10-CM | POA: Insufficient documentation

## 2013-09-16 NOTE — Patient Instructions (Addendum)
Continue hydrocodone/APAP for pain as well as Flexeril. Have left rib detail films and left scapula x-rays.

## 2013-09-20 ENCOUNTER — Ambulatory Visit (INDEPENDENT_AMBULATORY_CARE_PROVIDER_SITE_OTHER): Payer: 59 | Admitting: Internal Medicine

## 2013-09-20 DIAGNOSIS — Z23 Encounter for immunization: Secondary | ICD-10-CM

## 2013-09-25 ENCOUNTER — Other Ambulatory Visit: Payer: Self-pay | Admitting: Internal Medicine

## 2013-10-21 ENCOUNTER — Other Ambulatory Visit: Payer: Self-pay | Admitting: Internal Medicine

## 2014-02-21 ENCOUNTER — Other Ambulatory Visit: Payer: Self-pay

## 2014-02-21 MED ORDER — LOSARTAN POTASSIUM-HCTZ 100-25 MG PO TABS: 1.0000 | ORAL_TABLET | Freq: Every day | ORAL | Status: DC

## 2014-02-25 ENCOUNTER — Other Ambulatory Visit: Payer: Self-pay

## 2014-02-26 NOTE — Telephone Encounter (Signed)
Needs appointment for Cpe. Message left with patient.

## 2014-02-28 MED ORDER — LOSARTAN POTASSIUM-HCTZ 100-25 MG PO TABS: 1.0000 | ORAL_TABLET | Freq: Every day | ORAL | Status: DC

## 2014-02-28 NOTE — Telephone Encounter (Signed)
Patient has scheduled an appointment for a CPE on 03/18/2014. Will refill Losartan-HCTZ till then.

## 2014-03-04 ENCOUNTER — Ambulatory Visit (INDEPENDENT_AMBULATORY_CARE_PROVIDER_SITE_OTHER): Payer: 59 | Admitting: Internal Medicine

## 2014-03-04 ENCOUNTER — Encounter: Payer: Self-pay | Admitting: Internal Medicine

## 2014-03-04 VITALS — BP 142/90 | HR 80 | Temp 99.2°F | Wt 220.0 lb

## 2014-03-04 DIAGNOSIS — J069 Acute upper respiratory infection, unspecified: Secondary | ICD-10-CM

## 2014-03-04 DIAGNOSIS — I10 Essential (primary) hypertension: Secondary | ICD-10-CM

## 2014-03-04 MED ORDER — HYDROCODONE-HOMATROPINE 5-1.5 MG/5ML PO SYRP: 5.0000 mL | ORAL_SOLUTION | Freq: Three times a day (TID) | ORAL | Status: DC | PRN

## 2014-03-04 MED ORDER — PROMETHAZINE HCL 25 MG PO TABS: 25.0000 mg | ORAL_TABLET | Freq: Three times a day (TID) | ORAL | Status: DC | PRN

## 2014-03-04 MED ORDER — AZITHROMYCIN 250 MG PO TABS: ORAL_TABLET | ORAL | Status: DC

## 2014-03-04 NOTE — Progress Notes (Signed)
   Subjective:    Patient ID: [name] [name], male    DOB: 24-Nov-1956, 58 y.o.   MRN: [id number]  HPI  In today with URI symptoms. Has scratchy throat, coughing, congestion. Is bringing up discolored sputum. History of hypertension. Has appointment next week for physical exam. Blood pressure is elevated today. Secretary has been ill with influenza. Patient has no fever, chills or myalgias. He did have influenza immunization.    Review of Systems     Objective:   Physical Exam Pharynx is slightly injected. TMs slightly full but not red. Sounds nasally congested. Chest clear to auscultation. Neck supple without adenopathy.       Assessment & Plan:  Acute URI  History of hypertension-blood pressure slightly elevated today. Return for physical exam next week and further evaluation regarding management of hypertension  Plan: Zithromax Z-Pak take 2 tablets day one followed by 1 tablet days 2 through 5. If not better in 7 days and Zithromax refilled . Hycodan 8 ounces 1 teaspoon by mouth every 8 hours when necessary cough.

## 2014-03-04 NOTE — Patient Instructions (Addendum)
Take Hycodan one tsp po q 6 hours prn cough. Take Z-Pak. Return next week for CPE and labs as well as BP management

## 2014-03-18 ENCOUNTER — Encounter: Payer: Self-pay | Admitting: Internal Medicine

## 2014-03-18 ENCOUNTER — Ambulatory Visit (INDEPENDENT_AMBULATORY_CARE_PROVIDER_SITE_OTHER): Payer: 59 | Admitting: Internal Medicine

## 2014-03-18 VITALS — BP 130/80 | HR 68 | Temp 97.8°F | Ht 70.5 in | Wt 217.0 lb

## 2014-03-18 DIAGNOSIS — Z125 Encounter for screening for malignant neoplasm of prostate: Secondary | ICD-10-CM

## 2014-03-18 DIAGNOSIS — E785 Hyperlipidemia, unspecified: Secondary | ICD-10-CM

## 2014-03-18 DIAGNOSIS — Z79899 Other long term (current) drug therapy: Secondary | ICD-10-CM

## 2014-03-18 DIAGNOSIS — Z0000 Encounter for general adult medical examination without abnormal findings: Secondary | ICD-10-CM

## 2014-03-18 DIAGNOSIS — K589 Irritable bowel syndrome without diarrhea: Secondary | ICD-10-CM

## 2014-03-18 DIAGNOSIS — Z8719 Personal history of other diseases of the digestive system: Secondary | ICD-10-CM

## 2014-03-18 DIAGNOSIS — E119 Type 2 diabetes mellitus without complications: Secondary | ICD-10-CM

## 2014-03-18 DIAGNOSIS — I10 Essential (primary) hypertension: Secondary | ICD-10-CM

## 2014-03-18 DIAGNOSIS — Z130 Encounter for screening for diseases of the blood and blood-forming organs and certain disorders involving the immune mechanism: Secondary | ICD-10-CM

## 2014-03-18 DIAGNOSIS — K219 Gastro-esophageal reflux disease without esophagitis: Secondary | ICD-10-CM

## 2014-03-18 LAB — COMPREHENSIVE METABOLIC PANEL
ALT: 29 U/L (ref 0–53)
AST: 28 U/L (ref 0–37)
Albumin: 4.4 g/dL (ref 3.5–5.2)
Alkaline Phosphatase: 52 U/L (ref 39–117)
BUN: 12 mg/dL (ref 6–23)
CO2: 31 mEq/L (ref 19–32)
Calcium: 9.3 mg/dL (ref 8.4–10.5)
Chloride: 102 mEq/L (ref 96–112)
Creat: 0.99 mg/dL (ref 0.50–1.35)
Glucose, Bld: 98 mg/dL (ref 70–99)
Potassium: 3.9 mEq/L (ref 3.5–5.3)
Sodium: 141 mEq/L (ref 135–145)
Total Bilirubin: 0.9 mg/dL (ref 0.2–1.2)
Total Protein: 6.7 g/dL (ref 6.0–8.3)

## 2014-03-18 LAB — CBC WITH DIFFERENTIAL/PLATELET
Basophils Absolute: 0 10*3/uL (ref 0.0–0.1)
Basophils Relative: 1 % (ref 0–1)
Eosinophils Absolute: 0.1 10*3/uL (ref 0.0–0.7)
Eosinophils Relative: 2 % (ref 0–5)
HCT: 41 % (ref 39.0–52.0)
Hemoglobin: 14.8 g/dL (ref 13.0–17.0)
Lymphocytes Relative: 57 % — ABNORMAL HIGH (ref 12–46)
Lymphs Abs: 2.5 10*3/uL (ref 0.7–4.0)
MCH: 32.2 pg (ref 26.0–34.0)
MCHC: 36.1 g/dL — ABNORMAL HIGH (ref 30.0–36.0)
MCV: 89.1 fL (ref 78.0–100.0)
Monocytes Absolute: 0.3 10*3/uL (ref 0.1–1.0)
Monocytes Relative: 6 % (ref 3–12)
Neutro Abs: 1.5 10*3/uL — ABNORMAL LOW (ref 1.7–7.7)
Neutrophils Relative %: 34 % — ABNORMAL LOW (ref 43–77)
Platelets: 224 10*3/uL (ref 150–400)
RBC: 4.6 MIL/uL (ref 4.22–5.81)
RDW: 13.8 % (ref 11.5–15.5)
WBC: 4.3 10*3/uL (ref 4.0–10.5)

## 2014-03-18 LAB — HEMOGLOBIN A1C
Hgb A1c MFr Bld: 5.4 % (ref <5.7)
Mean Plasma Glucose: 108 mg/dL (ref <117)

## 2014-03-18 LAB — LIPID PANEL
Cholesterol: 165 mg/dL (ref 0–200)
HDL: 46 mg/dL (ref >39)
LDL Cholesterol: 92 mg/dL (ref 0–99)
Total CHOL/HDL Ratio: 3.6 Ratio
Triglycerides: 133 mg/dL (ref <150)
VLDL: 27 mg/dL (ref 0–40)

## 2014-03-19 LAB — PSA: PSA: 1.57 ng/mL (ref <=4.00)

## 2014-03-21 ENCOUNTER — Other Ambulatory Visit: Payer: Self-pay | Admitting: Internal Medicine

## 2014-03-21 NOTE — Telephone Encounter (Signed)
Refill x 6 months 

## 2014-03-25 ENCOUNTER — Encounter: Payer: Self-pay | Admitting: Internal Medicine

## 2014-03-25 NOTE — Patient Instructions (Addendum)
Return in 6 months or when necessary. Continue Crestor for hyperlipidemia. Continue diet control for diabetes. Continue antihypertensive regimen as previously prescribed. Take Xanax for anxiety/irritable bowel syndrome.

## 2014-03-25 NOTE — Progress Notes (Signed)
   Subjective:    Patient ID: [name] [name], male    DOB: [birth year], 58 y.o.   MRN: [id number]  HPI 58 year old white male with history of hypertension, type 2 diabetes mellitus, hyperlipidemia in today for health maintenance exam. History of GE reflux and irritable bowel syndrome.  Dr. [place] is his eye physician and he has had a regular annual eye visit with her. Blood pressure is initially elevated today but rechecked it was 130/80.  Social history: Has long-time live-in girlfriend. He is divorced. One son from his only marriage. Social alcohol consumption. Does not smoke. He owns a [employer].  Family history: Father died of prostate cancer. Mother died with dementia with apparent history of alcoholism. One brother.  Patient had colonoscopy 2005 showing diverticulosis. He has had recurrent episodes of diverticulitis.  A Cardiolite study October 2003 which was negative.    Review of Systems  Constitutional: Negative.   HENT: Negative.   Eyes: Negative.        Wears glasses  Respiratory: Negative.   Cardiovascular: Negative.   Gastrointestinal:       History of irritable bowel syndrome and GE reflux  Endocrine:       History of type 2 diabetes mellitus  Musculoskeletal:       Musculoskeletal pain related to riding horses  Skin: Negative.   Allergic/Immunologic: Positive for environmental allergies.  Psychiatric/Behavioral:       History of anxiety       Objective:   Physical Exam  Vitals reviewed. Constitutional: He is oriented to person, place, and time. He appears well-developed and well-nourished. No distress.  HENT:  Head: Normocephalic and atraumatic.  Right Ear: External ear normal.  Left Ear: External ear normal.  Mouth/Throat: Oropharynx is clear and moist. No oropharyngeal exudate.  Eyes: Conjunctivae and EOM are normal. Pupils are equal, round, and reactive to light. Right eye exhibits no discharge. Left eye exhibits no discharge. No scleral icterus.    Neck: Neck supple. No JVD present. No thyromegaly present.  Cardiovascular: Normal rate, regular rhythm and normal heart sounds.   No murmur heard. Pulmonary/Chest: Breath sounds normal. No respiratory distress. He has no wheezes. He has no rales. He exhibits no tenderness.  Abdominal: Soft. Bowel sounds are normal. He exhibits no distension and no mass. There is no rebound and no guarding.  Genitourinary: Prostate normal.  Musculoskeletal: Normal range of motion. He exhibits no edema.  Lymphadenopathy:    He has no cervical adenopathy.  Neurological: He is alert and oriented to person, place, and time. He has normal reflexes. No cranial nerve deficit.  Skin: Skin is warm and dry. No rash noted. He is not diaphoretic.  Psychiatric: He has a normal mood and affect. His behavior is normal. Judgment and thought content normal.          Assessment & Plan:  Irritable bowel syndrome-treated with Xanax because there is an element of anxiety to this with him  GE reflux-stable with PPI  History of diverticulitis  Type 2 diabetes mellitus-hemoglobin A1c excellent  History of hyperlipidemia-lipid panel stable on Crestor  History of hypertension-stable on current regimen  Plan: Return in 6 months. Continue same medication.

## 2014-05-26 ENCOUNTER — Other Ambulatory Visit: Payer: Self-pay

## 2014-05-27 ENCOUNTER — Other Ambulatory Visit: Payer: Self-pay

## 2014-05-27 MED ORDER — LOSARTAN POTASSIUM-HCTZ 100-25 MG PO TABS: 1.0000 | ORAL_TABLET | Freq: Every day | ORAL | Status: DC

## 2014-07-24 ENCOUNTER — Encounter: Payer: Self-pay | Admitting: Gastroenterology

## 2014-09-23 ENCOUNTER — Encounter: Payer: Self-pay | Admitting: Internal Medicine

## 2014-09-23 ENCOUNTER — Ambulatory Visit (INDEPENDENT_AMBULATORY_CARE_PROVIDER_SITE_OTHER): Payer: 59 | Admitting: Internal Medicine

## 2014-09-23 DIAGNOSIS — K5732 Diverticulitis of large intestine without perforation or abscess without bleeding: Secondary | ICD-10-CM

## 2014-09-23 DIAGNOSIS — Z1211 Encounter for screening for malignant neoplasm of colon: Secondary | ICD-10-CM

## 2014-09-23 DIAGNOSIS — Z23 Encounter for immunization: Secondary | ICD-10-CM

## 2014-09-23 NOTE — Patient Instructions (Signed)
Flu shot advice given

## 2014-09-23 NOTE — Progress Notes (Signed)
   Subjective:    Patient ID: [name] [name], male    DOB: March 09, 1956, 58 y.o.   MRN: [id number]  HPI  Flu shot given    Review of Systems     Objective:   Physical Exam        Assessment & Plan:

## 2014-11-04 ENCOUNTER — Other Ambulatory Visit: Payer: Self-pay | Admitting: Internal Medicine

## 2014-11-04 NOTE — Telephone Encounter (Signed)
Please call him and see why he needs this. Is he having another attack of diverticulitis?

## 2014-11-05 NOTE — Telephone Encounter (Signed)
Patient says he is pain right now, it has been going on since Sunday.  Appt made for him to come in and discuss this issue.

## 2014-11-06 ENCOUNTER — Ambulatory Visit (INDEPENDENT_AMBULATORY_CARE_PROVIDER_SITE_OTHER): Payer: 59 | Admitting: Internal Medicine

## 2014-11-06 ENCOUNTER — Telehealth: Payer: Self-pay | Admitting: Internal Medicine

## 2014-11-06 ENCOUNTER — Encounter: Payer: Self-pay | Admitting: Internal Medicine

## 2014-11-06 VITALS — BP 138/82 | HR 99 | Temp 98.4°F | Wt 219.0 lb

## 2014-11-06 DIAGNOSIS — K5732 Diverticulitis of large intestine without perforation or abscess without bleeding: Secondary | ICD-10-CM

## 2014-11-06 LAB — CBC WITH DIFFERENTIAL/PLATELET
Basophils Absolute: 0 10*3/uL (ref 0.0–0.1)
Basophils Relative: 0 % (ref 0–1)
Eosinophils Absolute: 0.1 10*3/uL (ref 0.0–0.7)
Eosinophils Relative: 1 % (ref 0–5)
HCT: 45.2 % (ref 39.0–52.0)
Hemoglobin: 16.3 g/dL (ref 13.0–17.0)
Lymphocytes Relative: 28 % (ref 12–46)
Lymphs Abs: 2.3 10*3/uL (ref 0.7–4.0)
MCH: 32.7 pg (ref 26.0–34.0)
MCHC: 36.1 g/dL — ABNORMAL HIGH (ref 30.0–36.0)
MCV: 90.8 fL (ref 78.0–100.0)
Monocytes Absolute: 0.7 10*3/uL (ref 0.1–1.0)
Monocytes Relative: 9 % (ref 3–12)
Neutro Abs: 5.1 10*3/uL (ref 1.7–7.7)
Neutrophils Relative %: 62 % (ref 43–77)
Platelets: 180 10*3/uL (ref 150–400)
RBC: 4.98 MIL/uL (ref 4.22–5.81)
RDW: 13.5 % (ref 11.5–15.5)
WBC: 8.3 10*3/uL (ref 4.0–10.5)

## 2014-11-06 MED ORDER — METRONIDAZOLE 500 MG PO TABS: 500.0000 mg | ORAL_TABLET | Freq: Two times a day (BID) | ORAL | Status: DC

## 2014-11-06 MED ORDER — CIPROFLOXACIN HCL 500 MG PO TABS: 500.0000 mg | ORAL_TABLET | Freq: Two times a day (BID) | ORAL | Status: DC

## 2014-11-06 NOTE — Telephone Encounter (Signed)
Erroneous encounter

## 2014-11-07 ENCOUNTER — Telehealth: Payer: Self-pay

## 2014-11-07 NOTE — Telephone Encounter (Signed)
-----   Message from Elby Showers, MD sent at 11/07/2014 12:24 PM EST ----- Please call pt- CBC is normal. Finish anitbiotics.

## 2014-11-07 NOTE — Telephone Encounter (Signed)
Tried to contact patient regarding labs.  Unable to leave message.

## 2014-11-09 ENCOUNTER — Encounter: Payer: Self-pay | Admitting: Internal Medicine

## 2014-11-09 NOTE — Patient Instructions (Signed)
Take Cipro and Flagyl as prescribed and advance diet slowly. Due for colonoscopy in approximately 2 months

## 2014-11-09 NOTE — Progress Notes (Signed)
   Subjective:    Patient ID: [name] [name], male    DOB: 07-08-1956, 58 y.o.   MRN: [id number]  HPI  Patient presents today with complaint of left lower quadrant pain for several days. He has a history of diverticulitis in the remote past. Says he has not had an attack for 5 years. He found some leftover Cipro and Flagyl and began taking that a couple of days ago. Says symptoms have started to improve as of today. No fever or shaking chills or vomiting. In 2005, he had colonoscopy by Dr. Erskine Emery. He was found to have diverticulosis. Subsequently in 2009 he had another attack of diverticulitis. He saw Dr. Deatra Ina at that time. In 2007 saw Dr. Deatra Ina for resolving acute diverticulitis. In 2010, Dr. Deatra Ina referred him to general surgeon for evaluation after yet another episode of diverticulitis. Sigmoid colectomy was discussed. Patient was told he might need that some point in the future. Interestingly, patient has not had further issues until now. He has a long-standing history of irritable bowel syndrome and has diarrhea almost on a daily basis. He is due for colonoscopy.    Review of Systems     Objective:   Physical Exam  Abdomen is soft and nondistended. He has mild left lower quadrant tenderness without rebound. CBC with differential drawn.      Assessment & Plan:  Acute diverticulitis-improving  Plan: Cipro 500 mg twice daily for 10 days. Flagyl 500 mg twice daily for 7 days. Needs to have colonoscopy in approximately 8 weeks once episode has had time to resolve. Patient is to stay on clear liquids until pain resolves and advance diet slowly.

## 2014-12-17 ENCOUNTER — Other Ambulatory Visit: Payer: Self-pay | Admitting: *Deleted

## 2014-12-17 MED ORDER — LOSARTAN POTASSIUM-HCTZ 100-25 MG PO TABS: 1.0000 | ORAL_TABLET | Freq: Every day | ORAL | Status: DC

## 2015-01-07 ENCOUNTER — Other Ambulatory Visit: Payer: Self-pay | Admitting: Internal Medicine

## 2015-01-08 NOTE — Telephone Encounter (Signed)
Refill x 6 months 

## 2015-01-09 ENCOUNTER — Other Ambulatory Visit: Payer: Self-pay | Admitting: *Deleted

## 2015-01-09 MED ORDER — ALPRAZOLAM 0.5 MG PO TABS: ORAL_TABLET | ORAL | Status: DC

## 2015-01-09 NOTE — Telephone Encounter (Signed)
Script for xanax called into patient pharmacy

## 2015-02-13 ENCOUNTER — Encounter: Payer: Self-pay | Admitting: Internal Medicine

## 2015-02-13 ENCOUNTER — Ambulatory Visit (INDEPENDENT_AMBULATORY_CARE_PROVIDER_SITE_OTHER): Payer: 59 | Admitting: Internal Medicine

## 2015-02-13 VITALS — BP 128/86 | HR 93 | Temp 97.8°F | Wt 219.0 lb

## 2015-02-13 DIAGNOSIS — J069 Acute upper respiratory infection, unspecified: Secondary | ICD-10-CM

## 2015-02-13 MED ORDER — LEVOFLOXACIN 500 MG PO TABS: 500.0000 mg | ORAL_TABLET | Freq: Every day | ORAL | Status: DC

## 2015-02-13 MED ORDER — HYDROCODONE-HOMATROPINE 5-1.5 MG/5ML PO SYRP: 5.0000 mL | ORAL_SOLUTION | Freq: Three times a day (TID) | ORAL | Status: DC | PRN

## 2015-02-15 ENCOUNTER — Encounter: Payer: Self-pay | Admitting: Internal Medicine

## 2015-02-15 NOTE — Progress Notes (Signed)
   Subjective:    Patient ID: [name] [name], male    DOB: 08-03-1956, 59 y.o.   MRN: [id number]  HPI URI symptoms with cough and congestion. No fever or shaking chills. No sore throat. Sounds nasally congested. History of hypertension.    Review of Systems see above     Objective:   Physical Exam TMs are clear. Pharynx is only very slightly injected. Neck supple. Chest clear without rales or wheezing. Skin is warm and dry. Blood pressure stable.       Assessment & Plan:  Acute URI  History of hypertension  Plan: Levaquin 500 milligrams daily for 10 days. Hycodan 1 teaspoon by mouth every 8 hours when necessary cough with no refill.  15 minutes spent with patient

## 2015-02-15 NOTE — Patient Instructions (Signed)
Take Levaquin daily for 10 days with a meal. Take Hycodan as needed for cough.

## 2015-02-25 ENCOUNTER — Encounter: Payer: Self-pay | Admitting: Gastroenterology

## 2015-03-01 ENCOUNTER — Other Ambulatory Visit: Payer: Self-pay | Admitting: Internal Medicine

## 2015-03-02 DIAGNOSIS — K5752 Diverticulitis of both small and large intestine without perforation or abscess without bleeding: Secondary | ICD-10-CM

## 2015-03-02 NOTE — Telephone Encounter (Signed)
Had flare up and is taking medications from previous refill he had but he only had five days of flagyl to take he states he likes to have refills available so he can take when out of town if needed. Refills sent to patient pharmacy

## 2015-03-02 NOTE — Telephone Encounter (Signed)
Why does he needs refills on diverticulitis medication?

## 2015-03-16 ENCOUNTER — Telehealth: Payer: Self-pay | Admitting: *Deleted

## 2015-03-16 MED ORDER — LOSARTAN POTASSIUM-HCTZ 100-25 MG PO TABS: 1.0000 | ORAL_TABLET | Freq: Every day | ORAL | Status: DC

## 2015-03-16 NOTE — Telephone Encounter (Signed)
Refill sent for hyzaar patient needs to schedule CPE before anymore refills

## 2015-06-05 ENCOUNTER — Other Ambulatory Visit: Payer: Self-pay | Admitting: Internal Medicine

## 2015-06-13 ENCOUNTER — Other Ambulatory Visit: Payer: Self-pay | Admitting: Internal Medicine

## 2015-06-18 ENCOUNTER — Ambulatory Visit (INDEPENDENT_AMBULATORY_CARE_PROVIDER_SITE_OTHER): Payer: 59 | Admitting: Internal Medicine

## 2015-06-18 ENCOUNTER — Encounter: Payer: Self-pay | Admitting: Internal Medicine

## 2015-06-18 VITALS — BP 130/82 | HR 72 | Temp 98.0°F | Wt 219.0 lb

## 2015-06-18 DIAGNOSIS — H8112 Benign paroxysmal vertigo, left ear: Secondary | ICD-10-CM | POA: Diagnosis not present

## 2015-06-18 DIAGNOSIS — I10 Essential (primary) hypertension: Secondary | ICD-10-CM | POA: Diagnosis not present

## 2015-06-18 DIAGNOSIS — T148 Other injury of unspecified body region: Secondary | ICD-10-CM | POA: Diagnosis not present

## 2015-06-18 DIAGNOSIS — F411 Generalized anxiety disorder: Secondary | ICD-10-CM | POA: Diagnosis not present

## 2015-06-18 DIAGNOSIS — W57XXXA Bitten or stung by nonvenomous insect and other nonvenomous arthropods, initial encounter: Secondary | ICD-10-CM | POA: Diagnosis not present

## 2015-06-18 MED ORDER — TRIAMCINOLONE ACETONIDE 0.1 % EX CREA: 1.0000 "application " | TOPICAL_CREAM | Freq: Three times a day (TID) | CUTANEOUS | Status: DC

## 2015-06-18 MED ORDER — LOSARTAN POTASSIUM-HCTZ 100-25 MG PO TABS: 1.0000 | ORAL_TABLET | Freq: Every day | ORAL | Status: DC

## 2015-06-18 NOTE — Progress Notes (Signed)
   Subjective:    Patient ID: [name] [name], male    DOB: 13-Mar-1956, 59 y.o.   MRN: [id number]  HPI Multiple issues today. He came in today complaining of tick bite right lateral upper arm. Present for 2 or 3 weeks. His itching and won't go away. Also has had issues recently with vertigo and some discomfort in left ear. Has been plagued with this before. 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 He will be  out of blood pressure medication at the end of the week. He really needs a physical exam says he can't come right now due to business pressures in situational stress. Agrees to come November 2016. With regard to irritable bowel syndromes, says diarrhea has improved.    Review of Systems     Objective:   Physical Exam  Skin warm and dry. Small papule right lateral upper arm consistent with insect bite. No nystagmus demonstrated. Left TM clear. Neck is supple. No JVD thyromegaly or carotid bruits. Chest clear to auscultation. Cardiac exam regular rate and rhythm. Extremities without edema.      Assessment & Plan:  Benign positional vertigo-exam axis needed  Situational stress  Essential hypertension- blood pressure stable on current medication. This was refilled  Tick bite right arm-prescription for triamcinolone cream 0.1% to use on tick bite 3 times daily until healed  History of irritable bowel syndrome  Plan: Triamcinolone cream to use on tick bite 3 times daily until healed. Refilled antihypertensives medication 10/29/2015 at which time he agrees to come for physical exam.

## 2015-06-18 NOTE — Patient Instructions (Addendum)
Anti-hypertensive medication refilled. Patient agrees to come for physical exam November 2016. Triamcinolone cream to use on an insect bite 3 times daily. Take Xanax as needed for vertigo.

## 2015-06-28 ENCOUNTER — Other Ambulatory Visit: Payer: Self-pay | Admitting: Internal Medicine

## 2015-07-28 ENCOUNTER — Encounter: Payer: Self-pay | Admitting: Internal Medicine

## 2015-07-28 ENCOUNTER — Ambulatory Visit (INDEPENDENT_AMBULATORY_CARE_PROVIDER_SITE_OTHER): Payer: 59 | Admitting: Internal Medicine

## 2015-07-28 VITALS — BP 138/82 | HR 71 | Temp 98.2°F | Wt 221.0 lb

## 2015-07-28 DIAGNOSIS — R1032 Left lower quadrant pain: Secondary | ICD-10-CM | POA: Diagnosis not present

## 2015-07-28 LAB — POCT URINALYSIS DIPSTICK
Bilirubin, UA: NEGATIVE
Blood, UA: NEGATIVE
Glucose, UA: NEGATIVE
Ketones, UA: NEGATIVE
Leukocytes, UA: NEGATIVE
Nitrite, UA: NEGATIVE
Protein, UA: NEGATIVE
Spec Grav, UA: <=1.005
Urobilinogen, UA: NEGATIVE
pH, UA: 5

## 2015-07-28 MED ORDER — HYDROCODONE-ACETAMINOPHEN 10-325 MG PO TABS: 1.0000 | ORAL_TABLET | Freq: Three times a day (TID) | ORAL | Status: DC | PRN

## 2015-07-28 MED ORDER — MELOXICAM 15 MG PO TABS: 15.0000 mg | ORAL_TABLET | Freq: Every day | ORAL | Status: DC

## 2015-07-28 NOTE — Patient Instructions (Addendum)
Take Meloxicam daily and Norco prn. Call if symptoms worsen

## 2015-07-31 ENCOUNTER — Ambulatory Visit: Payer: 59 | Admitting: Internal Medicine

## 2015-08-24 NOTE — Progress Notes (Signed)
   Subjective:    Patient ID: MARKEITH JUE, male    DOB: [birth year], 59 y.o.   MRN: [id number]  HPI  patient complaining of some left lower pelvic pain in the area just adjacent to left anterior superior iliac spine. Has a history of diverticulitis but doesn't feel that this represents a flareup of that. He thought about a kidney stone. Urinalysis shows no hematuria. No evidence of hernia with no bulge noted. Doesn't recall any heavy lifting. Does ride cutting horses. Going out of town and is concerned about the pain. No nausea vomiting or diarrhea. No fever or shaking chills.    Review of Systems see above     Objective:   Physical Exam  Urine dipstick is normal. No hernias to direct palpation. Abdomen: no hepatosplenomegaly masses or significant tenderness      Assessment & Plan:  Musculoskeletal pain  Plan: Meloxicam 15 mg daily. Norco 10/325 every 6-8 hours when necessary pain. Apply ice to area for 20 minutes twice daily.

## 2015-09-14 ENCOUNTER — Other Ambulatory Visit: Payer: Self-pay | Admitting: Internal Medicine

## 2015-09-24 ENCOUNTER — Other Ambulatory Visit: Payer: Self-pay | Admitting: Internal Medicine

## 2015-09-24 NOTE — Telephone Encounter (Signed)
Refill once 

## 2015-09-25 ENCOUNTER — Ambulatory Visit (INDEPENDENT_AMBULATORY_CARE_PROVIDER_SITE_OTHER): Payer: 59 | Admitting: Internal Medicine

## 2015-09-25 DIAGNOSIS — Z23 Encounter for immunization: Secondary | ICD-10-CM | POA: Diagnosis not present

## 2015-10-29 ENCOUNTER — Telehealth: Payer: Self-pay | Admitting: Internal Medicine

## 2015-10-29 NOTE — Telephone Encounter (Signed)
States they were eating Reesee Cups (little bite size).  His son bit into one and noticed that it has a worm in the middle of it.  Says they bought this bag of candy from [employer] about a month or so ago.  States he has been eating them for at least a month now.  States these worms are alive inside of these candy's.  Please see picture that patient has sent to me.  Worm as a little red head on it.  States he doesn't have any symptoms or feel sick.  However, he wonders if he should be tested for anything given that these worms are ALIVE.  States they have since opened up several more packs of the reesee cups and there are worms in ALL of them.        Please advise.

## 2015-10-29 NOTE — Telephone Encounter (Signed)
Ask him to call back  if develops symptoms.

## 2015-10-29 NOTE — Telephone Encounter (Signed)
Spoke with patient to advise of Dr. Verlene Mayer disposition.  Patient verbalized understanding of these instructions.

## 2015-12-07 ENCOUNTER — Other Ambulatory Visit: Payer: Self-pay | Admitting: Internal Medicine

## 2015-12-07 NOTE — Telephone Encounter (Signed)
Phoned to pharmacy 

## 2015-12-07 NOTE — Telephone Encounter (Signed)
Refill x 3 months 

## 2015-12-14 ENCOUNTER — Encounter: Payer: Self-pay | Admitting: Internal Medicine

## 2015-12-14 ENCOUNTER — Ambulatory Visit (INDEPENDENT_AMBULATORY_CARE_PROVIDER_SITE_OTHER): Payer: 59 | Admitting: Internal Medicine

## 2015-12-14 VITALS — BP 130/72 | HR 84 | Temp 98.7°F | Resp 20 | Ht 71.0 in | Wt 223.0 lb

## 2015-12-14 DIAGNOSIS — J069 Acute upper respiratory infection, unspecified: Secondary | ICD-10-CM

## 2015-12-14 MED ORDER — LEVOFLOXACIN 500 MG PO TABS: 500.0000 mg | ORAL_TABLET | Freq: Every day | ORAL | Status: DC

## 2015-12-14 MED ORDER — HYDROCODONE-HOMATROPINE 5-1.5 MG/5ML PO SYRP: 5.0000 mL | ORAL_SOLUTION | Freq: Three times a day (TID) | ORAL | Status: DC | PRN

## 2015-12-14 NOTE — Progress Notes (Signed)
   Subjective:    Patient ID: [name] [name], male    DOB: 11-16-1956, 59 y.o.   MRN: [id number]  HPI  Has come down with an acute upper respiratory infection. Has been symptomatic for several days. Not getting better. Cough and congestion and slight sore throat. No fever or shaking chills.    Review of Systems     Objective:   Physical Exam  Skin warm and dry. Nodes none. TMs are clear. Pharynx injected without exudate. Neck is supple without adenopathy. Chest clear to auscultation.      Assessment & Plan:  Acute URI  Plan: Levaquin 500 milligrams daily for 10 days. Hycodan 1 teaspoon by mouth every 8 hours when necessary cough. Rest and drink plenty of fluids.

## 2015-12-14 NOTE — Patient Instructions (Addendum)
Take Levaquin 500 mg daily x 10 days. Hycodan as needed for cough. Rest and drink plenty of fluids.

## 2016-01-07 ENCOUNTER — Encounter: Payer: Self-pay | Admitting: Gastroenterology

## 2016-02-11 ENCOUNTER — Ambulatory Visit (INDEPENDENT_AMBULATORY_CARE_PROVIDER_SITE_OTHER): Payer: 59 | Admitting: Internal Medicine

## 2016-02-11 ENCOUNTER — Encounter: Payer: Self-pay | Admitting: Internal Medicine

## 2016-02-11 ENCOUNTER — Ambulatory Visit
Admission: RE | Admit: 2016-02-11 | Discharge: 2016-02-11 | Disposition: A | Discharge status: Home / Self Care | Payer: 59 | Source: Ambulatory Visit | Attending: Internal Medicine | Admitting: Internal Medicine

## 2016-02-11 VITALS — BP 124/90 | HR 97 | Temp 98.1°F | Resp 20 | Ht 71.0 in | Wt 218.0 lb

## 2016-02-11 DIAGNOSIS — R509 Fever, unspecified: Secondary | ICD-10-CM

## 2016-02-11 DIAGNOSIS — J209 Acute bronchitis, unspecified: Secondary | ICD-10-CM

## 2016-02-11 DIAGNOSIS — R059 Cough, unspecified: Secondary | ICD-10-CM

## 2016-02-11 DIAGNOSIS — R05 Cough: Secondary | ICD-10-CM

## 2016-02-11 DIAGNOSIS — B349 Viral infection, unspecified: Secondary | ICD-10-CM

## 2016-02-11 MED ORDER — CEFTRIAXONE SODIUM 1 G IJ SOLR
1.0000 g | INTRAMUSCULAR | Status: AC
  Administered 2016-02-11: 1 g via INTRAMUSCULAR

## 2016-02-11 MED ORDER — LEVOFLOXACIN 500 MG PO TABS: 500.0000 mg | ORAL_TABLET | Freq: Every day | ORAL | Status: DC

## 2016-02-11 MED ORDER — HYDROCODONE-HOMATROPINE 5-1.5 MG/5ML PO SYRP: 5.0000 mL | ORAL_SOLUTION | Freq: Three times a day (TID) | ORAL | Status: DC | PRN

## 2016-02-11 MED ORDER — PROMETHAZINE HCL 25 MG PO TABS: 25.0000 mg | ORAL_TABLET | Freq: Three times a day (TID) | ORAL | Status: DC | PRN

## 2016-02-11 NOTE — Patient Instructions (Signed)
Rocephin 1 g IM. Levaquin 500 milligrams daily for 10 days. Hycodan as needed for cough. Phenergan if needed for nausea. Chest x-ray obtained and is negative for pneumonia. Rest and drink plenty of fluids.

## 2016-02-11 NOTE — Progress Notes (Signed)
   Subjective:    Patient ID: [name] [name], male    DOB: August 20, 1956, 60 y.o.   MRN: [id number]  HPI  A couple of days ago came down with severe sore throat, subsequently developed cough with discolored sputum production, fever, myalgias and chills. Had a little bit of diarrhea and some nausea. Took Phenergan for nausea. He did take flu vaccine this year. He's leaving tomorrow to go to [place] to a horse show. Doesn't feel well and sounds nasally congested.    Review of Systems     Objective:   Physical Exam Sounds nasally congested when he speaks. Pharynx slightly injected without exudate. TMs are clear. Neck is supple without adenopathy. Chest some scattered rhonchi on the right side. No wheezing. Chest x-ray is negative.       Assessment & Plan:  Viral syndrome  Bronchitis  Plan: Rocephin 1 g IM. Levaquin 500 milligrams daily for 10 days. Phenergan if needed for nausea. Hycodan 1 teaspoon by mouth every 8 hours when necessary cough.

## 2016-02-19 ENCOUNTER — Telehealth: Payer: Self-pay | Admitting: Internal Medicine

## 2016-02-19 MED ORDER — BENZONATATE 200 MG PO CAPS: 200.0000 mg | ORAL_CAPSULE | Freq: Three times a day (TID) | ORAL | Status: DC | PRN

## 2016-02-19 NOTE — Telephone Encounter (Signed)
Rx escribed for Tessalon perles

## 2016-02-19 NOTE — Telephone Encounter (Signed)
Says he is feeling much better, but he is still coughing.  He has almost used all of the cough syrup you gave him.  He would like to know if you'll give him some Tessalon Perles to use during the day when he's having so much of the cough?    [facility]:  [facility] in [place]

## 2016-02-22 ENCOUNTER — Telehealth: Payer: Self-pay | Admitting: Internal Medicine

## 2016-02-22 NOTE — Telephone Encounter (Signed)
States that he took Nyquil last night for cough about 10pm.  Took Dayquil this a.m. Around 7:30.  His normal meds around 7:30.  He ate breakfast around 9:30-10:00 with some orange juice.  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  He could breathe through his nose.  States he doesn't think it was stuck in his throat.  Maybe he didn't drink enough fluid?  It has passed now.  He called his son because it scared him.  He wants to be seen.  I explained that we don't have any openings and he really should be seen at urgent care if he is still having any symptoms.  He doesn't want to go to Urgent Care.  He says the symptoms are resolving now.  His son is with him.    At this time now, feels the knot is gone, just a little burning now in that particular spot.  Please advise how he should proceed.

## 2016-02-22 NOTE — Telephone Encounter (Signed)
Patients son Cheri Rous notified

## 2016-02-22 NOTE — Telephone Encounter (Signed)
It sounds like an esophageal spasm and will likely resolve

## 2016-03-27 ENCOUNTER — Other Ambulatory Visit: Payer: Self-pay | Admitting: Internal Medicine

## 2016-03-28 NOTE — Telephone Encounter (Signed)
Needs CPE. Please refill once and book

## 2016-03-29 ENCOUNTER — Encounter: Payer: Self-pay | Admitting: Internal Medicine

## 2016-03-29 ENCOUNTER — Ambulatory Visit (INDEPENDENT_AMBULATORY_CARE_PROVIDER_SITE_OTHER): Payer: 59 | Admitting: Internal Medicine

## 2016-03-29 VITALS — BP 132/90 | HR 102 | Temp 98.0°F | Resp 20 | Wt 210.0 lb

## 2016-03-29 DIAGNOSIS — A0811 Acute gastroenteropathy due to Norwalk agent: Secondary | ICD-10-CM

## 2016-03-29 DIAGNOSIS — R509 Fever, unspecified: Secondary | ICD-10-CM | POA: Diagnosis not present

## 2016-03-29 DIAGNOSIS — A084 Viral intestinal infection, unspecified: Secondary | ICD-10-CM | POA: Diagnosis not present

## 2016-03-29 LAB — CBC WITH DIFFERENTIAL/PLATELET
Basophils Absolute: 0 cells/uL (ref 0–200)
Basophils Relative: 0 %
Eosinophils Absolute: 94 cells/uL (ref 15–500)
Eosinophils Relative: 2 %
HCT: 45 % (ref 38.5–50.0)
Hemoglobin: 16.9 g/dL (ref 13.2–17.1)
Lymphocytes Relative: 31 %
Lymphs Abs: 1457 cells/uL (ref 850–3900)
MCH: 32.9 pg (ref 27.0–33.0)
MCHC: 37.6 g/dL — ABNORMAL HIGH (ref 32.0–36.0)
MCV: 87.7 fL (ref 80.0–100.0)
MPV: 9.3 fL (ref 7.5–12.5)
Monocytes Absolute: 564 cells/uL (ref 200–950)
Monocytes Relative: 12 %
Neutro Abs: 2585 cells/uL (ref 1500–7800)
Neutrophils Relative %: 55 %
Platelets: 148 10*3/uL (ref 140–400)
RBC: 5.13 MIL/uL (ref 4.20–5.80)
RDW: 13.7 % (ref 11.0–15.0)
WBC: 4.7 10*3/uL (ref 3.8–10.8)

## 2016-03-29 MED ORDER — PROMETHAZINE HCL 25 MG PO TABS: 25.0000 mg | ORAL_TABLET | Freq: Three times a day (TID) | ORAL | Status: DC | PRN

## 2016-03-29 NOTE — Progress Notes (Signed)
Patient notified

## 2016-04-16 NOTE — Progress Notes (Signed)
   Subjective:    Patient ID: [name] [name], male    DOB: 12-27-1955, 60 y.o.   MRN: [id number]  HPI Multiple episodes of diarrhea associated with fever and nausea onset after eating at a [employer]. However he said that restaurant almost daily. Son is not ill. No recent travel history. No blood in bowel movement.    Review of Systems as above     Objective:   Physical Exam  No significant abdominal pain or tenderness      Assessment & Plan:  Viral gastroenteritis  Plan: Clear liquids until diarrhea has resolved for 24 hours then advance diet slowly. Avoid milk products for several days. Take Phenergan as needed for nausea. CBC with differential is within normal limits

## 2016-04-16 NOTE — Patient Instructions (Addendum)
Clear liquids until diarrhea has resolved then advance diet slowly. Take Phenergan as needed for nausea. CBC is within normal limits

## 2016-05-04 ENCOUNTER — Emergency Department
Admission: EM | Admit: 2016-05-04 | Discharge: 2016-05-04 | Disposition: A | Discharge status: Home / Self Care | Payer: 59 | Attending: Emergency Medicine | Admitting: Emergency Medicine

## 2016-05-04 ENCOUNTER — Encounter: Payer: Self-pay | Admitting: *Deleted

## 2016-05-04 DIAGNOSIS — Y92007 Garden or yard of unspecified non-institutional (private) residence as the place of occurrence of the external cause: Secondary | ICD-10-CM | POA: Insufficient documentation

## 2016-05-04 DIAGNOSIS — I10 Essential (primary) hypertension: Secondary | ICD-10-CM | POA: Diagnosis not present

## 2016-05-04 DIAGNOSIS — E782 Mixed hyperlipidemia: Secondary | ICD-10-CM | POA: Diagnosis not present

## 2016-05-04 DIAGNOSIS — Y999 Unspecified external cause status: Secondary | ICD-10-CM | POA: Insufficient documentation

## 2016-05-04 DIAGNOSIS — E119 Type 2 diabetes mellitus without complications: Secondary | ICD-10-CM | POA: Diagnosis not present

## 2016-05-04 DIAGNOSIS — S41111A Laceration without foreign body of right upper arm, initial encounter: Secondary | ICD-10-CM

## 2016-05-04 DIAGNOSIS — Y9389 Activity, other specified: Secondary | ICD-10-CM | POA: Diagnosis not present

## 2016-05-04 DIAGNOSIS — Z79899 Other long term (current) drug therapy: Secondary | ICD-10-CM | POA: Diagnosis not present

## 2016-05-04 DIAGNOSIS — W28XXXA Contact with powered lawn mower, initial encounter: Secondary | ICD-10-CM | POA: Insufficient documentation

## 2016-05-04 DIAGNOSIS — S51811A Laceration without foreign body of right forearm, initial encounter: Secondary | ICD-10-CM | POA: Insufficient documentation

## 2016-05-04 NOTE — ED Notes (Signed)
States hot wire whipped his right arm and arrives with laceration to right arm, states tetnus shot up to date

## 2016-05-04 NOTE — Discharge Instructions (Signed)
Nonsutured Laceration Care °A laceration is a cut that goes through all layers of the skin and extends into the tissue that is right under the skin. This type of cut is usually stitched up (sutured) or closed with tape (adhesive strips) or skin glue shortly after the injury happens. °However, if the wound is dirty or if several hours pass before medical treatment is provided, it is likely that germs (bacteria) will enter the wound. Closing a laceration after bacteria have entered it increases the risk of infection. In these cases, your health care provider may leave the laceration open (nonsutured) and cover it with a bandage. This type of treatment helps prevent infection and allows the wound to heal from the deepest layer of tissue damage up to the surface. °An open fracture is a type of injury that may involve nonsutured lacerations. An open fracture is a break in a bone that happens along with one or more lacerations through the skin that is near the fracture site. °HOW TO CARE FOR YOUR NONSUTURED LACERATION °· Take or apply over-the-counter and prescription medicines only as told by your health care provider. °· If you were prescribed an antibiotic medicine, take or apply it as told by your health care provider. Do not stop using the antibiotic even if your condition improves. °· Clean the wound one time each day or as told by your health care provider. °¨ Wash the wound with mild soap and water. °¨ Rinse the wound with water to remove all soap. °¨ Pat your wound dry with a clean towel. Do not rub the wound. °· Do not inject anything into the wound unless your health care provider told you to. °· Change any bandages (dressings) as told by your health care provider. This includes changing the dressing if it gets wet, dirty, or starts to smell bad. °· Keep the dressing dry until your health care provider says it can be removed. Do not take baths, swim, or do anything that puts your wound underwater until your  health care provider approves. °· Raise (elevate) the injured area above the level of your heart while you are sitting or lying down, if possible. °· Do not scratch or pick at the wound. °· Check your wound every day for signs of infection. Watch for: °¨ Redness, swelling, or pain. °¨ Fluid, blood, or pus. °· Keep all follow-up visits as told by your health care provider. This is important. °SEEK MEDICAL CARE IF: °· You received a tetanus and shot and you have swelling, severe pain, redness, or bleeding at the injection site.   °· You have a fever. °· Your pain is not controlled with medicine. °· You have increased redness, swelling, or pain at the site of your wound. °· You have fluid, blood, or pus coming from your wound. °· You notice a bad smell coming from your wound or your dressing. °· You notice something coming out of the wound, such as wood or glass. °· You notice a change in the color of your skin near your wound. °· You develop a new rash. °· You need to change the dressing frequently due to fluid, blood, or pus draining from the wound. °· You develop numbness around your wound. °SEEK IMMEDIATE MEDICAL CARE IF: °· Your pain suddenly increases and is severe. °· You develop severe swelling around the wound. °· The wound is on your hand or foot and you cannot properly move a finger or toe. °· The wound is on your hand or   foot and you notice that your fingers or toes look pale or bluish. °· You have a red streak going away from your wound. °  °This information is not intended to replace advice given to you by your health care provider. Make sure you discuss any questions you have with your health care provider. °  °Document Released: 11/09/2006 Document Revised: 04/28/2015 Document Reviewed: 12/08/2014 °Elsevier Interactive Patient Education ©2016 [employer] ° °

## 2016-05-04 NOTE — ED Provider Notes (Signed)
[facility] Emergency Department Provider Note  ____________________________________________  Time seen: Approximately 6:37 PM  I have reviewed the triage vital signs and the nursing notes.   HISTORY  Chief Complaint Laceration    HPI [name] [name] is a 60 y.o. male presenting with laceration to the right arm that he incurred today while mowing the yard. A piece of wire fencing became entangled in the lawn mower and whipped up hitting his arm. He denies pain radiating proximally or distally, limited ROM, numbness or tingling in the hand. Patient states he is up to date on tetanus vaccination.    Past Medical History  Diagnosis Date  . Diabetes mellitus   . Hyperlipidemia   . Hypertension   . Migraine headache   . Vertigo     Patient Active Problem List   Diagnosis Date Noted  . GERD (gastroesophageal reflux disease) 09/16/2013  . Mixed hyperlipidemia 12/07/2011  . HTN (hypertension) 06/07/2011  . Irritable bowel 06/07/2011  . Diabetes mellitus 06/07/2011  . DIVERTICULITIS-COLON 11/27/2008    Past Surgical History  Procedure Laterality Date  . Renal stone      Current Outpatient Rx  Name  Route  Sig  Dispense  Refill  . ALPRAZolam (XANAX) 0.5 MG tablet      TAKE 1 TABLET BY MOUTH EVERY MORNING AND 2 TABLETS EVERY NIGHT AT BEDTIME   90 tablet   3   . esomeprazole (NEXIUM) 40 MG capsule      TAKE ONE CAPSULE BY MOUTH EVERY DAY   30 capsule   11   . losartan-hydrochlorothiazide (HYZAAR) 100-25 MG tablet      TAKE 1 TABLET BY MOUTH EVERY DAY   90 tablet   0     Patient needs physical- have him call   . meloxicam (MOBIC) 15 MG tablet   Oral   Take 1 tablet (15 mg total) by mouth daily.   30 tablet   2   . promethazine (PHENERGAN) 25 MG tablet   Oral   Take 1 tablet (25 mg total) by mouth every 8 (eight) hours as needed for nausea or vomiting.   20 tablet   0   . VERAMYST 27.5 MCG/SPRAY nasal spray      USE INTRANASALLY  AS INSTRUCTED FOR ALLERGIC RHINITIS   10 g   PRN     Allergies Iohexol  Family History  Problem Relation Age of Onset  . Cancer Father     Social History Social History  Substance Use Topics  . Smoking status: Never Smoker   . Smokeless tobacco: None  . Alcohol Use: 1.2 - 1.8 oz/week    2-3 Shots of liquor per week     Review of Systems   Musculoskeletal: Negative for musculoskeletal pain. Negative for pain radiating distally or proximally in the affected arm.  Skin: Negative for rash, abrasions, ecchymosis. Positive for laceration to the right arm. Negative for swelling.  Neurological: Negative for headaches, focal weakness or numbness. 10-point ROS otherwise negative.  ____________________________________________   PHYSICAL EXAM:  VITAL SIGNS: ED Triage Vitals  Enc Vitals Group     BP 05/04/16 1748 142/95 mmHg     Pulse Rate 05/04/16 1748 78     Resp 05/04/16 1748 18     Temp 05/04/16 1748 98.2 F (36.8 C)     Temp Source 05/04/16 1748 Oral     SpO2 05/04/16 1748 97 %     Weight 05/04/16 1748 210 lb (95.255 kg)  Height 05/04/16 1748 6' (1.829 m)     Head Cir --      Peak Flow --      Pain Score 05/04/16 1749 2     Pain Loc --      Pain Edu? --      Excl. in [place]? --      Constitutional: Alert and oriented. Well appearing and in no acute distress. Head: Atraumatic. Neck: No stridor.  Cardiovascular: Good peripheral circulation. Respiratory: Normal respiratory effort without tachypnea or retractions. Musculoskeletal: Full range of motion to all extremities. No gross deformities appreciated. Neurologic:  Normal speech and language. No gross focal neurologic deficits are appreciated.  Skin:  Skin is warm, dry and intact. No rash noted. 7 cm laceration on the right forearm.  Psychiatric: Mood and affect are normal. Speech and behavior are normal. Patient exhibits appropriate insight and judgement.   ____________________________________________    LABS (all labs ordered are listed, but only abnormal results are displayed)  Labs Reviewed - No data to display   RADIOLOGY   No results found.  ____________________________________________    PROCEDURES  Procedure(s) performed: The wound was irrigated with Saline rinse and cleaned with surgical scrub solution. Dermabond and steristrips were used for wound closure.   LACERATION REPAIR Performed by: Darletta Moll Authorized by: Charline Bills Cuthriell Consent: Verbal consent obtained. Risks and benefits: risks, benefits and alternatives were discussed Consent given by: patient Patient identity confirmed: provided demographic data Prepped and Draped in normal sterile fashion Wound explored  Laceration Location: Right forearm  Laceration Length: 7 cm  No Foreign Bodies seen or palpated  Irrigation method: syringe Amount of cleaning: standard  Skin closure: Dermabond and Steri-Strips   Technique: Superficial laceration is covered using Dermabond. Steri-Strips are placed to provide stability to area to prevent Separation from under Dermabond.   Patient tolerance: Patient tolerated the procedure well with no immediate complications.     Medications - No data to display   ____________________________________________   INITIAL IMPRESSION / ASSESSMENT AND PLAN / ED COURSE  Pertinent labs & imaging results that were available during my care of the patient were reviewed by me and considered in my medical decision making (see chart for details).  Patient's diagnosis is consistent with laceration to the right forearm. Laceration is superficial in nature and does not require sutures. It is treated with Dermabond and Steri-Strips as described above. Patient tolerated procedure well.  Patient is to follow up with this department as needed or otherwise directed. Patient is given ED precautions to return to the ED for any worsening or new  symptoms.     ____________________________________________  FINAL CLINICAL IMPRESSION(S) / ED DIAGNOSES  Final diagnoses:  Arm laceration, right, initial encounter      NEW MEDICATIONS STARTED DURING THIS VISIT:  Discharge Medication List as of 05/04/2016  7:00 PM          This chart was dictated using voice recognition software/Dragon. Despite best efforts to proofread, errors can occur which can change the meaning. Any change was purely unintentional.   Darletta Moll, PA-C 05/04/16 2004  Orbie Pyo, MD 05/05/16 [phone]

## 2016-06-24 ENCOUNTER — Other Ambulatory Visit: Payer: Self-pay | Admitting: Internal Medicine

## 2016-06-24 NOTE — Telephone Encounter (Signed)
Has been told to make appt for CPE. Do not see in computer. Please call before refilling

## 2016-08-16 ENCOUNTER — Other Ambulatory Visit: Payer: 59 | Admitting: Internal Medicine

## 2016-08-16 DIAGNOSIS — I10 Essential (primary) hypertension: Secondary | ICD-10-CM

## 2016-08-16 DIAGNOSIS — E782 Mixed hyperlipidemia: Secondary | ICD-10-CM

## 2016-08-16 DIAGNOSIS — Z0000 Encounter for general adult medical examination without abnormal findings: Secondary | ICD-10-CM

## 2016-08-16 DIAGNOSIS — E119 Type 2 diabetes mellitus without complications: Secondary | ICD-10-CM

## 2016-08-16 LAB — CBC WITH DIFFERENTIAL/PLATELET
Basophils Absolute: 39 cells/uL (ref 0–200)
Basophils Relative: 1 %
Eosinophils Absolute: 39 cells/uL (ref 15–500)
Eosinophils Relative: 1 %
HCT: 41.9 % (ref 38.5–50.0)
Hemoglobin: 14.8 g/dL (ref 13.2–17.1)
Lymphocytes Relative: 51 %
Lymphs Abs: 1989 cells/uL (ref 850–3900)
MCH: 33.3 pg — ABNORMAL HIGH (ref 27.0–33.0)
MCHC: 35.3 g/dL (ref 32.0–36.0)
MCV: 94.4 fL (ref 80.0–100.0)
MPV: 9 fL (ref 7.5–12.5)
Monocytes Absolute: 273 cells/uL (ref 200–950)
Monocytes Relative: 7 %
Neutro Abs: 1560 cells/uL (ref 1500–7800)
Neutrophils Relative %: 40 %
Platelets: 154 10*3/uL (ref 140–400)
RBC: 4.44 MIL/uL (ref 4.20–5.80)
RDW: 14 % (ref 11.0–15.0)
WBC: 3.9 10*3/uL (ref 3.8–10.8)

## 2016-08-16 LAB — LIPID PANEL
Cholesterol: 194 mg/dL (ref 125–200)
HDL: 49 mg/dL (ref >=40)
LDL Cholesterol: 107 mg/dL (ref <130)
Total CHOL/HDL Ratio: 4 Ratio (ref <=5.0)
Triglycerides: 190 mg/dL — ABNORMAL HIGH (ref <150)
VLDL: 38 mg/dL — ABNORMAL HIGH (ref <30)

## 2016-08-16 LAB — COMPLETE METABOLIC PANEL WITH GFR
ALT: 28 U/L (ref 9–46)
AST: 23 U/L (ref 10–35)
Albumin: 4.2 g/dL (ref 3.6–5.1)
Alkaline Phosphatase: 44 U/L (ref 40–115)
BUN: 16 mg/dL (ref 7–25)
CO2: 31 mmol/L (ref 20–31)
Calcium: 9.2 mg/dL (ref 8.6–10.3)
Chloride: 100 mmol/L (ref 98–110)
Creat: 1.1 mg/dL (ref 0.70–1.25)
GFR, Est African American: 83 mL/min (ref >=60)
GFR, Est Non African American: 72 mL/min (ref >=60)
Glucose, Bld: 113 mg/dL — ABNORMAL HIGH (ref 65–99)
Potassium: 4.5 mmol/L (ref 3.5–5.3)
Sodium: 139 mmol/L (ref 135–146)
Total Bilirubin: 0.8 mg/dL (ref 0.2–1.2)
Total Protein: 6.7 g/dL (ref 6.1–8.1)

## 2016-08-17 LAB — PSA: PSA: 1.5 ng/mL (ref <=4.0)

## 2016-08-17 LAB — HEMOGLOBIN A1C
Hgb A1c MFr Bld: 5.5 % (ref <5.7)
Mean Plasma Glucose: 111 mg/dL

## 2016-08-22 ENCOUNTER — Encounter: Payer: Self-pay | Admitting: Internal Medicine

## 2016-08-22 ENCOUNTER — Ambulatory Visit (INDEPENDENT_AMBULATORY_CARE_PROVIDER_SITE_OTHER): Payer: 59 | Admitting: Internal Medicine

## 2016-08-22 ENCOUNTER — Other Ambulatory Visit: Payer: Self-pay

## 2016-08-22 VITALS — BP 144/78 | Temp 98.2°F | Ht 72.0 in | Wt 224.5 lb

## 2016-08-22 DIAGNOSIS — M25551 Pain in right hip: Secondary | ICD-10-CM

## 2016-08-22 DIAGNOSIS — K580 Irritable bowel syndrome with diarrhea: Secondary | ICD-10-CM

## 2016-08-22 DIAGNOSIS — E781 Pure hyperglyceridemia: Secondary | ICD-10-CM

## 2016-08-22 DIAGNOSIS — Z0000 Encounter for general adult medical examination without abnormal findings: Secondary | ICD-10-CM | POA: Diagnosis not present

## 2016-08-22 DIAGNOSIS — I10 Essential (primary) hypertension: Secondary | ICD-10-CM

## 2016-08-22 DIAGNOSIS — Z23 Encounter for immunization: Secondary | ICD-10-CM

## 2016-08-22 DIAGNOSIS — Z8719 Personal history of other diseases of the digestive system: Secondary | ICD-10-CM

## 2016-08-22 LAB — POCT URINALYSIS DIPSTICK
Bilirubin, UA: NEGATIVE
Blood, UA: NEGATIVE
Glucose, UA: NEGATIVE
Ketones, UA: NEGATIVE
Leukocytes, UA: NEGATIVE
Nitrite, UA: NEGATIVE
Protein, UA: NEGATIVE
Spec Grav, UA: <=1.005
Urobilinogen, UA: 0.2
pH, UA: 5

## 2016-08-22 MED ORDER — ALPRAZOLAM 0.5 MG PO TABS: ORAL_TABLET | ORAL | 0 refills | Status: DC

## 2016-08-22 MED ORDER — METRONIDAZOLE 500 MG PO TABS: 500.0000 mg | ORAL_TABLET | Freq: Two times a day (BID) | ORAL | 0 refills | Status: DC

## 2016-08-22 MED ORDER — LOSARTAN POTASSIUM-HCTZ 100-25 MG PO TABS: 1.0000 | ORAL_TABLET | Freq: Every day | ORAL | 0 refills | Status: DC

## 2016-08-22 MED ORDER — CIPROFLOXACIN HCL 500 MG PO TABS: 500.0000 mg | ORAL_TABLET | Freq: Two times a day (BID) | ORAL | 0 refills | Status: DC

## 2016-08-22 NOTE — Patient Instructions (Signed)
Cipro and Flagyl refilled for trip to [place] in October. Continue same antihypertensive medication. Watch diet and lose weight and exercise. Repeat lipid panel in 6 months. Appointment with orthopedist regarding right hip pain. Appointment with gastroenterologist regarding repeat colonoscopy

## 2016-08-22 NOTE — Progress Notes (Addendum)
   Subjective:    Patient ID: [name] [name], male    DOB: [birth year], 60 y.o.   MRN: [id number]  HPI      Review of Systems     Objective:   Physical Exam        Assessment & Plan:

## 2016-08-22 NOTE — Progress Notes (Signed)
Subjective:    Patient ID: [name] [name], male    DOB: 09-07-1956, 60 y.o.   MRN: [id number]  HPI 60 year old White Male in today for health maintenance exam and evaluation for medical issues.  He's having considerable problems with right hip and leg pain. Having trouble swinging right leg over saddle when riding. Having some issues lifting right leg up. Pain seems to be in the right anterior hip and right groin area. He thought at first it might be sciatica but this doesn't seem like sciatica. He says it's very painful. 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 At one point he actually fell trying to mount the horse because of the pain and discomfort.  He had an MRI of the LS spine in 2012 because of low back pain. Had no significant stenoses at that time.  Has not had x-ray of hip.  History of recurrent bouts of diverticulitis. Had colonoscopy by Dr. Deatra Ina in 2005 and needs to have repeat study. He is concerned because he had issues with the prep at last colonoscopy and had considerable abdominal pain and cramping to the point he had to be seen in the emergency department. He would like an easier prep.  History of essential hypertension.  History of irritable bowel syndrome treated with when necessary Xanax. Has diarrhea many mornings. Some anxiety with work stress.  A Cardiolite study done October 2003  was negative. He was thought to have chest wall pain at the time.  Colonoscopy in 2005 showed diverticulosis.  Social history: Does not smoke. Social alcohol consumption. Lives with longtime girlfriend. Divorced. 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 Patient is a self-employed Forensic scientist.  Family history: Father died of prostate cancer. Mother died with complications of dementia and apparently had alcoholism. One brother.    Review of Systems Had some issues  previously with impaired glucose tolerance but has been able to get that under control with diet. Hemoglobin A1c is now normal     Objective:   Physical Exam  Constitutional: He is oriented to person, place, and time. He appears well-developed and well-nourished. No distress.  HENT:  Head: Normocephalic and atraumatic.  Right Ear: External ear normal.  Left Ear: External ear normal.  Mouth/Throat: Oropharynx is clear and moist.  Eyes: Conjunctivae and EOM are normal. Pupils are equal, round, and reactive to light. Right eye exhibits no discharge. Left eye exhibits no discharge.  Neck: Neck supple. No JVD present. No thyromegaly present.  Cardiovascular: Normal rate, regular rhythm, normal heart sounds and intact distal pulses.   No murmur heard. Pulmonary/Chest: Effort normal and breath sounds normal. He has no wheezes. He has no rales.  Abdominal: He exhibits no distension and no mass. There is no tenderness. There is no rebound and no guarding.  Genitourinary: Prostate normal.  Musculoskeletal: He exhibits no edema.  Lymphadenopathy:    He has no cervical adenopathy.  Neurological: He is alert and oriented to person, place, and time. He has normal reflexes. No cranial nerve deficit. Coordination normal.  Skin: Skin is warm and dry. No rash noted. He is not diaphoretic.  Psychiatric: He has a normal mood and affect. His behavior is normal. Judgment and thought content normal.          Assessment & Plan:  Essential hypertension-stable-recheck in 6 months. Sometimes blood pressure is a bit  labile but it was fine today when I checked it  Irritable bowel syndrome-treated with Xanax as needed. Long-standing history of IBS with diarrhea  Hypertriglyceridemia-needs to cut out fast food and start walking again  History of diverticulitis-have refill Cipro and Flagyl for him to have on hand with travel. Due for repeat colonoscopy. He has concerns about the prep he had to take at last  colonoscopy by Dr. Deatra Ina in 2015. He will need to discuss this with gastroenterologist. He said he has significant abdominal cramping such that he had to go to the emergency department he was so ill.  Right hip pain  History of impaired glucose tolerance-hemoglobin A1c is now normal  Obesity-recommend cutting out fast foods and began to walk some more. Recheck in 6 months.  Plan: Orthopedic consultation with Dr. Ninfa Linden regarding right hip pain. Do not think this is sciatica. I think he could well have osteoarthritis of the right hip. He is going to a horse show in October.  Return in 6 months for office visit blood pressure check and lipid panel.

## 2016-09-17 ENCOUNTER — Other Ambulatory Visit: Payer: Self-pay | Admitting: Internal Medicine

## 2016-10-11 ENCOUNTER — Other Ambulatory Visit: Payer: Self-pay | Admitting: Internal Medicine

## 2016-10-13 ENCOUNTER — Ambulatory Visit (INDEPENDENT_AMBULATORY_CARE_PROVIDER_SITE_OTHER): Payer: 59 | Admitting: Orthopaedic Surgery

## 2016-11-23 ENCOUNTER — Other Ambulatory Visit: Payer: Self-pay | Admitting: Internal Medicine

## 2016-11-23 NOTE — Telephone Encounter (Signed)
Refill each x 6 months 

## 2016-11-24 NOTE — Telephone Encounter (Signed)
Xanax and hyzaar refilled.

## 2016-11-28 ENCOUNTER — Ambulatory Visit (INDEPENDENT_AMBULATORY_CARE_PROVIDER_SITE_OTHER): Payer: 59 | Admitting: Internal Medicine

## 2016-11-28 ENCOUNTER — Encounter: Payer: Self-pay | Admitting: Internal Medicine

## 2016-11-28 VITALS — BP 142/84 | HR 94 | Temp 100.0°F | Ht 72.0 in | Wt 226.0 lb

## 2016-11-28 DIAGNOSIS — J069 Acute upper respiratory infection, unspecified: Secondary | ICD-10-CM

## 2016-11-28 MED ORDER — LEVOFLOXACIN 500 MG PO TABS: 500.0000 mg | ORAL_TABLET | Freq: Every day | ORAL | 0 refills | Status: DC

## 2016-11-28 MED ORDER — HYDROCODONE-HOMATROPINE 5-1.5 MG/5ML PO SYRP: 5.0000 mL | ORAL_SOLUTION | Freq: Four times a day (QID) | ORAL | 0 refills | Status: DC | PRN

## 2016-11-28 MED ORDER — METHYLPREDNISOLONE ACETATE 80 MG/ML IJ SUSP
80.0000 mg | Freq: Once | INTRAMUSCULAR | Status: AC
  Administered 2016-11-28: 80 mg via INTRAMUSCULAR

## 2016-11-28 MED ORDER — CEFTRIAXONE SODIUM 1 G IJ SOLR
1.0000 g | Freq: Once | INTRAMUSCULAR | Status: AC
  Administered 2016-11-28: 1 g via INTRAMUSCULAR

## 2016-11-28 NOTE — Patient Instructions (Signed)
Rocephin 1 g IM given. 80 mg Depo-Medrol given IM. Levaquin 500 milligrams daily for 10 days. Hycodan 1 teaspoon by mouth every 8 hours when necessary cough. Rest and drink plenty of fluids.

## 2016-11-28 NOTE — Progress Notes (Signed)
   Subjective:    Patient ID: [name] [name], male    DOB: 1956-10-09, 60 y.o.   MRN: [id number]  HPI  He has plans to be married in [place] on December 6. His fiance is not aware of this. It will be a surprise to her. Yesterday he came down with URI symptoms. He sounds hoarse and congested. He has a sore throat has been coughing. He has a low-grade fever.  With regard to hip pain, he finally did get some relief with hip injection. He's planning on having hip replacement therapy in about a year. Wants to continue to ride horses. Has a full schedule this year.    Review of Systems has malaise and fatigue     Objective:   Physical Exam Pharynx is red. Left TM is full. Right TM clear. Neck is supple without adenopathy. Chest clear to auscultation without rales or wheezing       Assessment & Plan:  Acute URI  Osteoarthritis of right hip  Plan: 1 g IM Rocephin given in office today. 80 mg Depo-Medrol given in office today. Levaquin 500 milligrams daily for 10 days. Hycodan 1 teaspoon by mouth every 8 hours when necessary cough. Rest and drink plenty of fluids.

## 2016-11-29 MED ORDER — LEVOFLOXACIN 500 MG PO TABS: 500.0000 mg | ORAL_TABLET | Freq: Every day | ORAL | 0 refills | Status: DC

## 2016-11-29 NOTE — Addendum Note (Signed)
Addended by: Amado Coe on: 11/29/2016 09:16 AM   Modules accepted: Orders

## 2016-12-15 ENCOUNTER — Telehealth: Payer: Self-pay | Admitting: Internal Medicine

## 2016-12-15 DIAGNOSIS — J069 Acute upper respiratory infection, unspecified: Secondary | ICD-10-CM

## 2016-12-15 MED ORDER — CLARITHROMYCIN 500 MG PO TABS: 500.0000 mg | ORAL_TABLET | Freq: Two times a day (BID) | ORAL | 0 refills | Status: DC

## 2016-12-15 NOTE — Telephone Encounter (Signed)
Patient called still complaining of upper respiratory issues.  He would like to know if there is something else he can take.  Please advise.

## 2016-12-15 NOTE — Telephone Encounter (Signed)
Biaxin sent to pharmacy.  Patient informed.

## 2016-12-15 NOTE — Telephone Encounter (Signed)
Biaxin 500 mg bid with meals x 10 days. Please call in.

## 2017-01-02 ENCOUNTER — Encounter: Payer: Self-pay | Admitting: Internal Medicine

## 2017-01-02 ENCOUNTER — Ambulatory Visit: Payer: Self-pay | Admitting: Internal Medicine

## 2017-01-02 VITALS — BP 158/90 | HR 64 | Temp 97.8°F | Wt 226.0 lb

## 2017-01-02 DIAGNOSIS — R05 Cough: Secondary | ICD-10-CM | POA: Diagnosis not present

## 2017-01-02 DIAGNOSIS — R059 Cough, unspecified: Secondary | ICD-10-CM

## 2017-01-02 DIAGNOSIS — J209 Acute bronchitis, unspecified: Secondary | ICD-10-CM

## 2017-01-02 DIAGNOSIS — H65113 Acute and subacute allergic otitis media (mucoid) (sanguinous) (serous), bilateral: Secondary | ICD-10-CM

## 2017-01-02 MED ORDER — CEFTRIAXONE SODIUM 1 G IJ SOLR
1.0000 g | Freq: Once | INTRAMUSCULAR | Status: AC
  Administered 2017-01-02: 1 g via INTRAMUSCULAR

## 2017-01-02 MED ORDER — PREDNISONE 10 MG PO TABS: ORAL_TABLET | ORAL | 1 refills | Status: DC

## 2017-01-02 MED ORDER — ALBUTEROL SULFATE HFA 108 (90 BASE) MCG/ACT IN AERS: 2.0000 | INHALATION_SPRAY | Freq: Four times a day (QID) | RESPIRATORY_TRACT | 0 refills | Status: DC | PRN

## 2017-01-02 MED ORDER — HYDROCODONE-HOMATROPINE 5-1.5 MG/5ML PO SYRP: 5.0000 mL | ORAL_SOLUTION | Freq: Three times a day (TID) | ORAL | 0 refills | Status: DC | PRN

## 2017-01-02 MED ORDER — BUDESONIDE-FORMOTEROL FUMARATE 160-4.5 MCG/ACT IN AERO: 2.0000 | INHALATION_SPRAY | RESPIRATORY_TRACT | 0 refills | Status: DC

## 2017-01-02 NOTE — Patient Instructions (Addendum)
Hycodan 1 teaspoon by mouth every 8 hours when necessary cough. Sterapred DS 10 mg six-day Dosepak. Albuterol inhaler 2 sprays by mouth 4 times daily. Symbicort 160/4.5 to take 2 sprays by mouth every 12 hours. No antibiotics prescribed since he's had 2 rounds of antibiotics. Rest and drink plenty of fluids.

## 2017-01-02 NOTE — Progress Notes (Signed)
   Subjective:    Patient ID: [name] [name], male    DOB: 1956-11-01, 61 y.o.   MRN: [id number]  HPI  Was hereDecember 4 just before getting married in [place] on December 6th. Had respiratory infection symptoms at that time which we treated with Levaquin for 10 days and cough medication. He was also was given 1 g IM Rocephin and 80 mg IM Depo-Medrol.  He called back on December 21 saying he was still coughing and having upper respiratory infection symptoms. We treated him over the phone with a 10 day course of Biaxin 500 mg twice daily.  This past weekend he went to or show in [place] and was very cold there. He's leaving later this week to go to [place] to another show. About 10 days ago he drove to [place] to pick up a horse and didn't get much sleep.  Continues to cough. Little sputum production. No documented fever. Not much wheezing. Maybe a little bit at night when he lies down. Not getting a lot of rest.   Review of Systems see above-says ears feel full     Objective:   Physical Exam Skin warm and dry. Nodes none. TMs are slightly full bilaterally. Neck is supple. Chest clear to auscultation without rales or wheezing       Assessment & Plan:  Acute bronchitis-protracted  Plan: 1 g IM Rocephin. Sterapred DS 10 mg 6 day dosepak. Has one refill on that. Albuterol inhaler 2 sprays by mouth 4 times daily. Samples of Symbicort 160/4.5 - 2 sprays by mouth every 12 hours. Refill Hycodan.

## 2017-02-03 ENCOUNTER — Encounter: Payer: Self-pay | Admitting: Internal Medicine

## 2017-02-03 ENCOUNTER — Ambulatory Visit (INDEPENDENT_AMBULATORY_CARE_PROVIDER_SITE_OTHER): Payer: 59 | Admitting: Internal Medicine

## 2017-02-03 VITALS — BP 142/88 | HR 82 | Temp 98.2°F | Wt 228.0 lb

## 2017-02-03 DIAGNOSIS — J209 Acute bronchitis, unspecified: Secondary | ICD-10-CM | POA: Diagnosis not present

## 2017-02-03 DIAGNOSIS — R05 Cough: Secondary | ICD-10-CM

## 2017-02-03 DIAGNOSIS — M25551 Pain in right hip: Secondary | ICD-10-CM | POA: Diagnosis not present

## 2017-02-03 DIAGNOSIS — R059 Cough, unspecified: Secondary | ICD-10-CM

## 2017-02-03 MED ORDER — DOXYCYCLINE HYCLATE 100 MG PO TABS: 100.0000 mg | ORAL_TABLET | Freq: Two times a day (BID) | ORAL | 0 refills | Status: DC

## 2017-02-03 MED ORDER — PREDNISONE 10 MG PO TABS: ORAL_TABLET | ORAL | 0 refills | Status: DC

## 2017-02-03 MED ORDER — HYDROCODONE-HOMATROPINE 5-1.5 MG/5ML PO SYRP: 5.0000 mL | ORAL_SOLUTION | Freq: Three times a day (TID) | ORAL | 0 refills | Status: DC | PRN

## 2017-02-03 MED ORDER — HYDROCODONE-ACETAMINOPHEN 10-325 MG PO TABS: 1.0000 | ORAL_TABLET | Freq: Three times a day (TID) | ORAL | 0 refills | Status: DC | PRN

## 2017-02-03 NOTE — Patient Instructions (Addendum)
Take Norco sparingly for hip pain when riding horseback. Sterapred DS 10 mg six-day Dosepak for respiratory infection. Doxycycline 100 mg twice daily for 10 days. Hycodan sparingly every 8 hours as needed for cough. Rest and drink plenty of fluids. Call with blood pressure readings a couple of weeks.

## 2017-02-03 NOTE — Progress Notes (Signed)
   Subjective:    Patient ID: [name] [name], male    DOB: 1956/11/27, 61 y.o.   MRN: [id number]  HPI  Recently traveled to [place], [place] with wife to a conference. His wife came back he'll with a restaurant where infection and now he has similar symptoms. He has had several respiratory infections this year. Has cough and congestion. No fever or shaking chills. Bringing up some discolored sputum.  Also having recurrent hip pain right hip. Needs hip replacement but does not want to do it until  October.  Review of Systems     Objective:   Physical Exam He's coughing a lot in the office. He sounds nasally congested. Neck is supple. Chest clear. Cardiac exam regular rate and rhythm. His blood pressure is elevated today. He should keep an eye on it call with some readings in a couple of weeks. Pharynx slightly injected. Skin warm and dry.       Assessment & Plan:  Acute  bronchitis    Right hip osteoarthritis-in need of hip replacement. Patient asking for small quantity of pain medication to take when riding horseback  Essential hypertension-to check blood pressure at home . May need to adjust medication.  Plan: Norco 10/325 one by mouth every 8 hours when necessary pain #30 no refill.  Hycodan 1 teaspoon by mouth every 8 hours when necessary cough. Doxycycline 100 mg twice daily for 10 days. Sterapred DS 10 mg 6 day dosepak.

## 2017-04-07 ENCOUNTER — Other Ambulatory Visit: Payer: Self-pay | Admitting: Internal Medicine

## 2017-04-07 DIAGNOSIS — K5792 Diverticulitis of intestine, part unspecified, without perforation or abscess without bleeding: Secondary | ICD-10-CM

## 2017-04-07 DIAGNOSIS — Z1211 Encounter for screening for malignant neoplasm of colon: Secondary | ICD-10-CM

## 2017-04-14 ENCOUNTER — Encounter: Payer: Self-pay | Admitting: Internal Medicine

## 2017-04-14 ENCOUNTER — Ambulatory Visit (HOSPITAL_COMMUNITY)
Admission: RE | Admit: 2017-04-14 | Discharge: 2017-04-14 | Disposition: A | Discharge status: Home / Self Care | Payer: 59 | Source: Ambulatory Visit | Attending: Internal Medicine | Admitting: Internal Medicine

## 2017-04-14 ENCOUNTER — Ambulatory Visit (HOSPITAL_COMMUNITY): Payer: 59

## 2017-04-14 ENCOUNTER — Ambulatory Visit (INDEPENDENT_AMBULATORY_CARE_PROVIDER_SITE_OTHER): Payer: 59 | Admitting: Internal Medicine

## 2017-04-14 ENCOUNTER — Telehealth: Payer: Self-pay

## 2017-04-14 VITALS — BP 130/88 | HR 62 | Temp 97.6°F | Wt 222.0 lb

## 2017-04-14 DIAGNOSIS — N400 Enlarged prostate without lower urinary tract symptoms: Secondary | ICD-10-CM | POA: Insufficient documentation

## 2017-04-14 DIAGNOSIS — R1030 Lower abdominal pain, unspecified: Secondary | ICD-10-CM | POA: Insufficient documentation

## 2017-04-14 DIAGNOSIS — K5730 Diverticulosis of large intestine without perforation or abscess without bleeding: Secondary | ICD-10-CM | POA: Diagnosis not present

## 2017-04-14 DIAGNOSIS — I700 Atherosclerosis of aorta: Secondary | ICD-10-CM | POA: Insufficient documentation

## 2017-04-14 DIAGNOSIS — D179 Benign lipomatous neoplasm, unspecified: Secondary | ICD-10-CM | POA: Diagnosis not present

## 2017-04-14 DIAGNOSIS — Z8719 Personal history of other diseases of the digestive system: Secondary | ICD-10-CM

## 2017-04-14 LAB — CBC WITH DIFFERENTIAL/PLATELET
Basophils Absolute: 55 cells/uL (ref 0–200)
Basophils Relative: 1 %
Eosinophils Absolute: 55 cells/uL (ref 15–500)
Eosinophils Relative: 1 %
HCT: 46.6 % (ref 38.5–50.0)
Hemoglobin: 16.5 g/dL (ref 13.2–17.1)
Lymphocytes Relative: 43 %
Lymphs Abs: 2365 cells/uL (ref 850–3900)
MCH: 33.2 pg — ABNORMAL HIGH (ref 27.0–33.0)
MCHC: 35.4 g/dL (ref 32.0–36.0)
MCV: 93.8 fL (ref 80.0–100.0)
MPV: 9.1 fL (ref 7.5–12.5)
Monocytes Absolute: 495 cells/uL (ref 200–950)
Monocytes Relative: 9 %
Neutro Abs: 2530 cells/uL (ref 1500–7800)
Neutrophils Relative %: 46 %
Platelets: 228 10*3/uL (ref 140–400)
RBC: 4.97 MIL/uL (ref 4.20–5.80)
RDW: 13.3 % (ref 11.0–15.0)
WBC: 5.5 10*3/uL (ref 3.8–10.8)

## 2017-04-14 NOTE — Telephone Encounter (Signed)
CT abd and pelvis w/o contrast authorization #[id number] dates 04/14/17-05/29/17.

## 2017-04-14 NOTE — Progress Notes (Signed)
   Subjective:    Patient ID: [name] [name], male    DOB: 06/05/1956, 61 y.o.   MRN: [id number]  HPI Apprx 2 weeks ago, had onset abdominal pain  LLQ consistent with prior history of diverticulitis. He had some left over antibiotics and starting taking Cipro and Flagyl as previously prescribed. He called here one week ago requesting refill. Has been on antibiotics 9 days now and is not getting better. Has been on soft diet. Has daily BM which is soft. No nausea vomiting fever or chills but pain is now across lower abdomen. Does not have urinary symtoms.  Had episode 2015. Had colonoscopy 2005. Had an attack in 2009 as well.            Review of Systems  No urinary symptoms.    Objective:   Physical Exam Abdominal exam: BS active. No hepatosplenomegaly masses  appreciated. Tender over lower abdomen with rebound tenderness mid lower abdomen.  Prostate is not boggy       Assessment & Plan:  Protracted abdominal pain not responding to Cipro and Flagyl.  Plan: Needs CT abdomen and pelvis.Apparently allergic to IV contrast. Study will be done without IV contrast. Approval obtained from insurance company.

## 2017-04-14 NOTE — Patient Instructions (Signed)
To go to [facility] to have CT of abdomen and pelvis without IV contrast.

## 2017-04-17 ENCOUNTER — Ambulatory Visit: Payer: Self-pay | Admitting: Internal Medicine

## 2017-06-19 ENCOUNTER — Other Ambulatory Visit: Payer: Self-pay | Admitting: Internal Medicine

## 2017-06-19 NOTE — Telephone Encounter (Signed)
Refill x 6 months 

## 2017-06-19 NOTE — Telephone Encounter (Signed)
Called in to [facility] - [place], [place] - [address]: [phone]  

## 2017-06-24 ENCOUNTER — Other Ambulatory Visit: Payer: Self-pay | Admitting: Internal Medicine

## 2017-09-11 ENCOUNTER — Ambulatory Visit (INDEPENDENT_AMBULATORY_CARE_PROVIDER_SITE_OTHER): Payer: 59 | Admitting: Internal Medicine

## 2017-09-11 ENCOUNTER — Encounter: Payer: Self-pay | Admitting: Internal Medicine

## 2017-09-11 VITALS — BP 150/90 | HR 74 | Temp 97.8°F | Wt 224.0 lb

## 2017-09-11 DIAGNOSIS — J209 Acute bronchitis, unspecified: Secondary | ICD-10-CM | POA: Diagnosis not present

## 2017-09-11 DIAGNOSIS — I10 Essential (primary) hypertension: Secondary | ICD-10-CM

## 2017-09-11 DIAGNOSIS — J40 Bronchitis, not specified as acute or chronic: Secondary | ICD-10-CM | POA: Diagnosis not present

## 2017-09-11 MED ORDER — LEVOFLOXACIN 500 MG PO TABS
500.0000 mg | ORAL_TABLET | Freq: Every day | ORAL | 0 refills | Status: DC
Start: 2017-09-11 — End: 2017-09-11

## 2017-09-11 MED ORDER — LEVOFLOXACIN 500 MG PO TABS: 500.0000 mg | ORAL_TABLET | Freq: Every day | ORAL | 0 refills | Status: DC

## 2017-09-11 MED ORDER — PREDNISONE 10 MG PO TABS: ORAL_TABLET | ORAL | 0 refills | Status: DC

## 2017-09-11 MED ORDER — CEFTRIAXONE SODIUM 1 G IJ SOLR
1.0000 g | Freq: Once | INTRAMUSCULAR | Status: AC
  Administered 2017-09-11: 1 g via INTRAMUSCULAR

## 2017-09-11 MED ORDER — ALBUTEROL SULFATE HFA 108 (90 BASE) MCG/ACT IN AERS: 2.0000 | INHALATION_SPRAY | Freq: Four times a day (QID) | RESPIRATORY_TRACT | 0 refills | Status: DC | PRN

## 2017-09-11 MED ORDER — HYDROCODONE-HOMATROPINE 5-1.5 MG/5ML PO SYRP
5.0000 mL | ORAL_SOLUTION | Freq: Three times a day (TID) | ORAL | 0 refills | Status: DC | PRN
Start: 2017-09-11 — End: 2018-04-24

## 2017-09-11 NOTE — Patient Instructions (Signed)
Rocephin 1 g IM. Levaquin 500 milligrams daily for 10 days. Take prednisone in tapering course as directed. Hycodan as needed for cough 1 teaspoon by mouth every 8 hours when necessary. Rest and drink plenty of fluids. Return in a couple of weeks for blood pressure check and flu vaccine.

## 2017-09-11 NOTE — Progress Notes (Signed)
   Subjective:    Patient ID: [name] [name], male    DOB: Jun 04, 1956, 61 y.o.   MRN: [id number]  HPI   61 year old Male in today for follow-up on hypertension. He is ill today with respiratory infection symptoms. Has cough with discolored sputum. He is developing a hordeolum right lower eyelid. No sore throat. Ears feel stuffy.  He does not have diabetes mellitus. There is remote history of impaired glucose tolerance years ago but recent hemoglobin A1c's have been normal. This will be checked today.  He has a history of recurrent diverticulitis, GE reflux, irritable bowel syndrome.  Says blood pressure has been good at home but it is elevated here today. He has been taking Sudafed.    Review of Systems see above-he wants to see dermatologist here in [place]. Has had apparent actinic keratoses treated in the past on hands face and ears.     Objective:   Physical Exam Hordeolum right lower eyelid. Pharynx is injected without exudate. TMs are slightly dull but not red. Neck is supple without adenopathy. Chest he has inspiratory wheezing bilaterally but no rales       Assessment & Plan:  Acute bronchitis  Essential hypertension-blood pressure likely elevated today due to taking decongestants  Plan: Defer flu vaccine for a couple of weeks. Levaquin 500 milligrams daily for 10 days. Prednisone 10 mg 6 day taper going from 60 mg to 0 mg over 7 days. Albuterol inhaler 2 sprays by mouth 4 times daily as needed for cough and wheezing. Hycodan 1 teaspoon by mouth every 8 hours when necessary cough. Rocephin 1 g IM.  Return in a couple of weeks for blood pressure check. Hot compresses to hordeolum.

## 2017-09-12 LAB — MICROALBUMIN / CREATININE URINE RATIO
Creatinine, Urine: 107 mg/dL (ref 20–320)
Microalb Creat Ratio: 26 mcg/mg creat (ref <30)
Microalb, Ur: 2.8 mg/dL

## 2017-09-12 LAB — BASIC METABOLIC PANEL WITH GFR
BUN: 15 mg/dL (ref 7–25)
CO2: 29 mmol/L (ref 20–32)
Calcium: 9.8 mg/dL (ref 8.6–10.3)
Chloride: 100 mmol/L (ref 98–110)
Creat: 1.11 mg/dL (ref 0.70–1.25)
GFR, Est African American: 82 mL/min/{1.73_m2} (ref >=60)
GFR, Est Non African American: 71 mL/min/{1.73_m2} (ref >=60)
Glucose, Bld: 153 mg/dL — ABNORMAL HIGH (ref 65–99)
Potassium: 3.9 mmol/L (ref 3.5–5.3)
Sodium: 137 mmol/L (ref 135–146)

## 2017-09-12 LAB — HEMOGLOBIN A1C
Hgb A1c MFr Bld: 5.6 % of total Hgb (ref <5.7)
Mean Plasma Glucose: 114 (calc)
eAG (mmol/L): 6.3 (calc)

## 2017-09-18 ENCOUNTER — Encounter: Payer: Self-pay | Admitting: Internal Medicine

## 2017-09-18 ENCOUNTER — Ambulatory Visit (INDEPENDENT_AMBULATORY_CARE_PROVIDER_SITE_OTHER): Payer: 59 | Admitting: Internal Medicine

## 2017-09-18 VITALS — BP 138/82 | HR 65 | Temp 97.6°F | Wt 224.0 lb

## 2017-09-18 DIAGNOSIS — J40 Bronchitis, not specified as acute or chronic: Secondary | ICD-10-CM

## 2017-09-18 DIAGNOSIS — F439 Reaction to severe stress, unspecified: Secondary | ICD-10-CM

## 2017-09-18 DIAGNOSIS — M7918 Myalgia, other site: Secondary | ICD-10-CM

## 2017-09-18 DIAGNOSIS — M791 Myalgia: Secondary | ICD-10-CM

## 2017-09-18 DIAGNOSIS — I10 Essential (primary) hypertension: Secondary | ICD-10-CM

## 2017-09-18 MED ORDER — HYDROCODONE-ACETAMINOPHEN 10-325 MG PO TABS: 1.0000 | ORAL_TABLET | Freq: Four times a day (QID) | ORAL | 0 refills | Status: DC | PRN

## 2017-09-18 MED ORDER — LEVOFLOXACIN 500 MG PO TABS: 500.0000 mg | ORAL_TABLET | Freq: Every day | ORAL | 0 refills | Status: DC

## 2017-09-18 NOTE — Progress Notes (Addendum)
   Subjective:    Patient ID: [name] [name], male    DOB: [birth year], 61 y.o.   MRN: [id number]  HPI In today to follow-up on URI, history of musculoskeletal pain and hypertension. At last visit his blood pressure was elevated but he was not feeling well due to significant respiratory infection which is been treated with prednisone and Levaquin. He is feeling better but is still coughing. He has a horse show to go to this coming weekend and then will be leaving for the [place] in [place]. He says his blood pressure has been in the 867Y systolically and low 19J diastolically at home. Blood pressure in office today is 138/82. He looks improved. He has a history of impaired glucose tolerance but recent hemoglobin A1c measurements over the past few years and been normal.    Review of Systems see above     Objective:   Physical Exam He has no significant wheezing on chest exam. Cardiac exam regular rate and rhythm. Extremities without edema Diabetic foot exam shows no evidence of ulcers or calluses.      Assessment & Plan:  Essential hypertension-we'll not change antihypertensive medication at this point. He has some situational stress with his wife and a dog that is terminally ill. He will return in late October for follow-up. For musculoskeletal pain gave him small prescription for hydrocodone a pap 10/325 to take for musculoskeletal pain. A hip replacement has previously been advised by Dr. Ninfa Linden but patient is not ready to undergo that. He will finish course of Levaquin and take an additional refill with him during his travel should symptoms recur.

## 2017-09-18 NOTE — Patient Instructions (Signed)
Take hydrocodone/APAP sparingly for musculoskeletal pain. Finish course of Levaquin and carry refill with you when traveling in case symptoms recur. Continue to monitor blood pressure at home. Please try to follow-up in late October here.

## 2017-09-19 ENCOUNTER — Telehealth: Payer: Self-pay | Admitting: Internal Medicine

## 2017-09-19 ENCOUNTER — Other Ambulatory Visit: Payer: Self-pay | Admitting: Internal Medicine

## 2017-09-19 MED ORDER — LEVOFLOXACIN 500 MG PO TABS: 500.0000 mg | ORAL_TABLET | Freq: Every day | ORAL | 0 refills | Status: DC

## 2017-09-19 NOTE — Telephone Encounter (Signed)
Lavaquin prescription went to a mail order company instead of [facility] in [place] on [address].

## 2017-09-19 NOTE — Telephone Encounter (Signed)
[facility] will give him this for $16

## 2017-09-19 NOTE — Telephone Encounter (Signed)
Called [facility] and they did receive the order however there is a hold on the prescription because it has been filled elsewhere. [facility] and they explained that if I canceled the order it would delay the medication because it was already in dispense and it wasn't a guarantee that the hold would would be lifted right away. Called and informed patient he states that I need to run that by with Dr.Baxley and see if there was something different she could do because he was going out of town. I informed patient that I would forward message to Dr. Renold Genta.

## 2017-09-19 NOTE — Telephone Encounter (Signed)
Levaquin sent to [facility]

## 2017-10-03 ENCOUNTER — Telehealth: Payer: Self-pay

## 2017-10-03 MED ORDER — MELOXICAM 15 MG PO TABS: 15.0000 mg | ORAL_TABLET | Freq: Every day | ORAL | 3 refills | Status: DC

## 2017-10-03 NOTE — Telephone Encounter (Signed)
Done

## 2017-10-03 NOTE — Telephone Encounter (Signed)
Refill x one year °

## 2017-10-03 NOTE — Telephone Encounter (Signed)
Pt would like a refill on his meloxicam 15 mg stated it helps his hips and he just started using it again but the bottle he has expired in 2017. Please advise

## 2017-12-07 DIAGNOSIS — L718 Other rosacea: Secondary | ICD-10-CM | POA: Diagnosis not present

## 2017-12-07 DIAGNOSIS — L570 Actinic keratosis: Secondary | ICD-10-CM | POA: Diagnosis not present

## 2017-12-07 DIAGNOSIS — D485 Neoplasm of uncertain behavior of skin: Secondary | ICD-10-CM | POA: Diagnosis not present

## 2017-12-07 DIAGNOSIS — L821 Other seborrheic keratosis: Secondary | ICD-10-CM | POA: Diagnosis not present

## 2017-12-29 ENCOUNTER — Ambulatory Visit (INDEPENDENT_AMBULATORY_CARE_PROVIDER_SITE_OTHER): Payer: 59 | Admitting: Internal Medicine

## 2017-12-29 DIAGNOSIS — Z23 Encounter for immunization: Secondary | ICD-10-CM

## 2017-12-29 NOTE — Patient Instructions (Signed)
Flu vaccine given.

## 2017-12-29 NOTE — Progress Notes (Signed)
Flu vaccine given.

## 2018-02-23 ENCOUNTER — Other Ambulatory Visit: Payer: Self-pay

## 2018-02-23 MED ORDER — ALPRAZOLAM 0.5 MG PO TABS: ORAL_TABLET | ORAL | 5 refills | Status: DC

## 2018-03-26 ENCOUNTER — Other Ambulatory Visit: Payer: Self-pay | Admitting: Internal Medicine

## 2018-04-02 DIAGNOSIS — L718 Other rosacea: Secondary | ICD-10-CM | POA: Diagnosis not present

## 2018-04-02 DIAGNOSIS — L570 Actinic keratosis: Secondary | ICD-10-CM | POA: Diagnosis not present

## 2018-04-03 ENCOUNTER — Other Ambulatory Visit: Payer: Self-pay

## 2018-04-03 ENCOUNTER — Telehealth: Payer: Self-pay

## 2018-04-03 MED ORDER — LOSARTAN POTASSIUM-HCTZ 50-12.5 MG PO TABS: 1.0000 | ORAL_TABLET | Freq: Two times a day (BID) | ORAL | 1 refills | Status: DC

## 2018-04-03 NOTE — Telephone Encounter (Signed)
Patient called back and said her pharmacy does have this in stock.  However, we sent a 90 day script for him on 4/1 to his pharmacy, so he shouldn't even be out at this point.  This is only 4/9.  Not sure why he's calling for a refill.  He was quite rude to [place].    Do you want [place] to go ahead and call additional refills for him to his pharmacy even though he has 3 months worth already?

## 2018-04-03 NOTE — Telephone Encounter (Signed)
[facility] [address] [facility] LOSARTAN [facility] and he wants Dr. Renold Genta to either call in something else. I advised him that that he will need to call around to local pharmacies and see who has it in stock he said he does that need to do be doing that, he said he is a really good friend of Dr. Renold Genta and to just send her this message.

## 2018-04-03 NOTE — Telephone Encounter (Signed)
Pharmacy was the one calling in for a refill said there was backorder on combination pill. I will call the pharmacy.

## 2018-04-11 ENCOUNTER — Telehealth: Payer: Self-pay | Admitting: Internal Medicine

## 2018-04-11 NOTE — Telephone Encounter (Signed)
Domnique Vantine Self [phone]  [place] called to say he feels like his Blood Pressure is up, he is flush and feels his pulse in his legs. He is currently out of town, [place]. I scheduled him for 11 in the morning.

## 2018-04-12 ENCOUNTER — Ambulatory Visit: Payer: 59 | Admitting: Internal Medicine

## 2018-04-12 ENCOUNTER — Encounter: Payer: Self-pay | Admitting: Internal Medicine

## 2018-04-12 VITALS — BP 130/90 | HR 66 | Ht 72.0 in | Wt 224.0 lb

## 2018-04-12 DIAGNOSIS — I10 Essential (primary) hypertension: Secondary | ICD-10-CM

## 2018-04-12 DIAGNOSIS — R232 Flushing: Secondary | ICD-10-CM | POA: Diagnosis not present

## 2018-04-12 DIAGNOSIS — F439 Reaction to severe stress, unspecified: Secondary | ICD-10-CM

## 2018-04-24 ENCOUNTER — Encounter: Payer: Self-pay | Admitting: Internal Medicine

## 2018-04-24 NOTE — Patient Instructions (Addendum)
Continue same medications.  Needs physical exam in the next 3 months or so

## 2018-04-24 NOTE — Progress Notes (Signed)
   Subjective:    Patient ID: [name] [name], male    DOB: 1956/11/23, 62 y.o.   MRN: [id number]  HPI Patient called to say that he felt that his blood pressure has been elevated.  He has felt flushed and felt a pulsing sensation in his legs.  He has been out of town in [place] and  flew home last night.  Still has situational stress.  His wife has been depressed due to her dog's illness.  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  He is also concerned about recall of losartan.    Review of Systems see above.  He has a history of irritable bowel syndrome for which he takes Xanax.     Objective:   Physical Exam His blood pressure today is 130/90.  Weight is 224 pounds.  BMI 30.38.  Neck supple without JVD thyromegaly or carotid bruits.  Chest clear to auscultation.  Cardiac exam regular rate and rhythm normal S1 and S2.  Extremities without edema.       Assessment & Plan:  Flushing-etiology unclear  Essential hypertension-stable on current regimen  Situational stress-25 minutes spent with patient talking about his situation  Plan: I do not know why he is feeling a pulsation in his legs.  I think his blood pressure is labile at times when he is stressed.  Blood pressures pretty good today and we are not changing anything in terms of his regimen.  He needs to have physical exam in the near future.  No labs were drawn today.

## 2018-05-08 ENCOUNTER — Ambulatory Visit: Payer: 59 | Admitting: Internal Medicine

## 2018-05-08 VITALS — BP 134/82

## 2018-05-08 DIAGNOSIS — R05 Cough: Secondary | ICD-10-CM

## 2018-05-08 DIAGNOSIS — J22 Unspecified acute lower respiratory infection: Secondary | ICD-10-CM

## 2018-05-08 DIAGNOSIS — J029 Acute pharyngitis, unspecified: Secondary | ICD-10-CM

## 2018-05-08 DIAGNOSIS — R059 Cough, unspecified: Secondary | ICD-10-CM

## 2018-05-08 MED ORDER — CEFTRIAXONE SODIUM 1 G IJ SOLR
1.0000 g | Freq: Once | INTRAMUSCULAR | Status: AC
  Administered 2018-05-08: 1 g via INTRAMUSCULAR

## 2018-05-08 MED ORDER — HYDROCODONE-HOMATROPINE 5-1.5 MG/5ML PO SYRP: 5.0000 mL | ORAL_SOLUTION | Freq: Three times a day (TID) | ORAL | 0 refills | Status: DC | PRN

## 2018-05-08 MED ORDER — AZITHROMYCIN 250 MG PO TABS: ORAL_TABLET | ORAL | 0 refills | Status: DC

## 2018-05-18 ENCOUNTER — Other Ambulatory Visit: Payer: Self-pay | Admitting: Internal Medicine

## 2018-05-21 ENCOUNTER — Encounter: Payer: Self-pay | Admitting: Internal Medicine

## 2018-05-21 NOTE — Patient Instructions (Addendum)
Rest and drink plenty of fluids.  Take Hycodan sparingly every 8 hours as needed for cough.  Take Zithromax Z-PAK 2 tablets day 1 followed by 1 tablet days 2 through 5.

## 2018-05-21 NOTE — Progress Notes (Signed)
   Subjective:    Patient ID: [name] [name], male    DOB: 03-20-1956, 62 y.o.   MRN: [id number]  HPI He is here today with cough congestion and respiratory infection symptoms.  His wife came down with similar illness prior to him becoming symptomatic.  He has malaise and fatigue.       Review of Systems see above     Objective:   Physical Exam TMs are clear.  Pharynx is injected without exudate.  Neck is supple without adenopathy.  Chest is clear to auscultation without rales or wheezing.       Assessment & Plan:  Acute respiratory infection  Plan: Zithromax Z-Pak take 2 tablets day 1 followed by 1 tablet days 2 through 5.  Hycodan 1 teaspoon p.o. every 8 hours as needed cough.  Rest and drink plenty of fluids.

## 2018-06-25 ENCOUNTER — Other Ambulatory Visit: Payer: Self-pay

## 2018-06-25 MED ORDER — LOSARTAN POTASSIUM-HCTZ 100-25 MG PO TABS: 1.0000 | ORAL_TABLET | Freq: Every day | ORAL | 3 refills | Status: DC

## 2018-06-25 NOTE — Telephone Encounter (Signed)
Patient called to request a refill.  

## 2018-06-25 NOTE — Telephone Encounter (Signed)
Then prescribe it that way for a year 100/25 #90 one po daily

## 2018-06-25 NOTE — Telephone Encounter (Signed)
Call pharmacy and see if regular Rx  Losartan/HCTZ100/25 one po daily is back in stock. We had to change to 2 tabs of this dose due to recall.

## 2018-06-25 NOTE — Telephone Encounter (Signed)
[facility] and [facility] do [facility] losarta/HCTZ 100/25.

## 2018-09-25 ENCOUNTER — Telehealth: Payer: Self-pay

## 2018-09-25 NOTE — Telephone Encounter (Signed)
Patient called he said he is out of town and he did something to his hand he said he would like for you to call him back.

## 2018-09-25 NOTE — Telephone Encounter (Signed)
Pt is in [place], [place] at [employer] and is scheduled to ride in cutting events. Injured hand last weekend on a fast, stout horse. May have everted hand accidentally. This is his reining hand. C/o shock like sensation with movement. Wearing a splint. Will not be back until this coming weekend. Wants to wait until returns home for evaluation.  Advisetake Ibuprofen. Has Tramadol for pain. Says shock like sensations are in thumb and fifth finger.

## 2018-10-23 ENCOUNTER — Encounter: Payer: Self-pay | Admitting: Internal Medicine

## 2018-10-23 ENCOUNTER — Other Ambulatory Visit: Payer: Self-pay

## 2018-10-23 ENCOUNTER — Ambulatory Visit (INDEPENDENT_AMBULATORY_CARE_PROVIDER_SITE_OTHER): Payer: 59 | Admitting: Internal Medicine

## 2018-10-23 VITALS — BP 142/78 | HR 77 | Temp 98.9°F | Wt 232.0 lb

## 2018-10-23 DIAGNOSIS — M5431 Sciatica, right side: Secondary | ICD-10-CM

## 2018-10-23 DIAGNOSIS — R059 Cough, unspecified: Secondary | ICD-10-CM

## 2018-10-23 DIAGNOSIS — R05 Cough: Secondary | ICD-10-CM

## 2018-10-23 DIAGNOSIS — J22 Unspecified acute lower respiratory infection: Secondary | ICD-10-CM

## 2018-10-23 DIAGNOSIS — S6391XA Sprain of unspecified part of right wrist and hand, initial encounter: Secondary | ICD-10-CM | POA: Diagnosis not present

## 2018-10-23 DIAGNOSIS — J029 Acute pharyngitis, unspecified: Secondary | ICD-10-CM | POA: Diagnosis not present

## 2018-10-23 MED ORDER — HYDROCODONE-HOMATROPINE 5-1.5 MG/5ML PO SYRP
5.0000 mL | ORAL_SOLUTION | Freq: Three times a day (TID) | ORAL | 0 refills | Status: DC | PRN
Start: 2018-10-23 — End: 2018-10-23

## 2018-10-23 MED ORDER — HYDROCODONE-HOMATROPINE 5-1.5 MG/5ML PO SYRP
5.0000 mL | ORAL_SOLUTION | Freq: Three times a day (TID) | ORAL | 0 refills | Status: DC | PRN
Start: 2018-10-23 — End: 2018-11-27

## 2018-10-23 MED ORDER — CEFTRIAXONE SODIUM 1 G IJ SOLR
1.0000 g | Freq: Once | INTRAMUSCULAR | Status: AC
  Administered 2018-10-23: 1 g via INTRAMUSCULAR

## 2018-10-23 MED ORDER — AZITHROMYCIN 250 MG PO TABS: ORAL_TABLET | ORAL | 1 refills | Status: DC

## 2018-10-23 MED ORDER — MELOXICAM 15 MG PO TABS: 15.0000 mg | ORAL_TABLET | Freq: Every day | ORAL | 3 refills | Status: DC

## 2018-10-23 NOTE — Patient Instructions (Addendum)
Meloxicam 15 mg daily for sciatica  Hycodan 1 teaspoon p.o. every 8 hours as needed cough.  Zithromax Z-PAK take 2 tablets p.o. day 1 followed by 1 tablet days 2 through 5 with 1 refill for acute lower respiratory infection  Refer to hand surgeon for evaluation of right hand injury which occurred at the latter part of September.

## 2018-10-23 NOTE — Addendum Note (Signed)
Addended by: Amado Coe on: 10/23/2018 12:32 PM   Modules accepted: Orders

## 2018-10-23 NOTE — Progress Notes (Signed)
   Subjective:    Patient ID: [name] [name], male    DOB: 17-Jan-1956, 62 y.o.   MRN: [id number]  HPI In late September he was riding a cutting horse somehow acutely rotated his right hand and wrist resulting in an injury that has persisted.  He is complaining of numbness/paresthesias second third and fourth fingers.  Finds it difficult to do many tasks including saddling his horse.  He is wearing a short brace.  Also, he went to [facility] last weekend and came down with a respiratory infection.  He has a cough fever malaise and fatigue.  Cough is productive.  Also having issues with right sciatica.    Review of Systems see above     Objective:   Physical Exam It is painful for him to externally rotate his right wrist.  He sounds hoarse.  His pharynx is slightly injected.  TMs are clear.  Neck is supple without adenopathy.  Chest clear to auscultation.  He has a deep congested cough.       Assessment & Plan:  Right hand injury with acute external rotation while riding a horse now with paresthesias right second third and fourth finger not improving.  Acute lower respiratory infection  Right sciatica-straight leg raising is negative at 90 degrees and muscle strength in the right lower extremity is normal.  He has right buttock pain  Plan: Rocephin 1 g IM.  Zithromax Z-PAK 2 p.o. day 1 followed by 1 p.o. days 2 through 5 with 1 refill. Hycodan 1 teaspoon p.o. every 8 hours as needed cough.  Start meloxicam 15 mg daily.  Refer to Copy.  25 minutes spent with patient

## 2018-10-30 DIAGNOSIS — L821 Other seborrheic keratosis: Secondary | ICD-10-CM | POA: Diagnosis not present

## 2018-10-30 DIAGNOSIS — L718 Other rosacea: Secondary | ICD-10-CM | POA: Diagnosis not present

## 2018-10-30 DIAGNOSIS — L570 Actinic keratosis: Secondary | ICD-10-CM | POA: Diagnosis not present

## 2018-10-31 DIAGNOSIS — S5411XA Injury of median nerve at forearm level, right arm, initial encounter: Secondary | ICD-10-CM | POA: Diagnosis not present

## 2018-10-31 DIAGNOSIS — M79641 Pain in right hand: Secondary | ICD-10-CM | POA: Diagnosis not present

## 2018-10-31 DIAGNOSIS — M79642 Pain in left hand: Secondary | ICD-10-CM | POA: Diagnosis not present

## 2018-10-31 DIAGNOSIS — S63502A Unspecified sprain of left wrist, initial encounter: Secondary | ICD-10-CM | POA: Diagnosis not present

## 2018-11-13 DIAGNOSIS — M5416 Radiculopathy, lumbar region: Secondary | ICD-10-CM | POA: Diagnosis not present

## 2018-11-13 DIAGNOSIS — M5136 Other intervertebral disc degeneration, lumbar region: Secondary | ICD-10-CM | POA: Diagnosis not present

## 2018-11-14 ENCOUNTER — Other Ambulatory Visit: Payer: Self-pay | Admitting: Physical Medicine and Rehabilitation

## 2018-11-14 DIAGNOSIS — M5416 Radiculopathy, lumbar region: Secondary | ICD-10-CM

## 2018-11-26 ENCOUNTER — Ambulatory Visit
Admission: RE | Admit: 2018-11-26 | Discharge: 2018-11-26 | Disposition: A | Discharge status: Home / Self Care | Payer: 59 | Source: Ambulatory Visit | Attending: Physical Medicine and Rehabilitation | Admitting: Physical Medicine and Rehabilitation

## 2018-11-26 ENCOUNTER — Other Ambulatory Visit: Payer: Self-pay | Admitting: Physical Medicine and Rehabilitation

## 2018-11-26 DIAGNOSIS — M5416 Radiculopathy, lumbar region: Secondary | ICD-10-CM

## 2018-11-26 DIAGNOSIS — M47817 Spondylosis without myelopathy or radiculopathy, lumbosacral region: Secondary | ICD-10-CM | POA: Diagnosis not present

## 2018-11-26 MED ORDER — METHYLPREDNISOLONE ACETATE 40 MG/ML INJ SUSP (RADIOLOG
120.0000 mg | Freq: Once | INTRAMUSCULAR | Status: AC
  Administered 2018-11-26: 120 mg via EPIDURAL

## 2018-11-26 MED ORDER — IOPAMIDOL (ISOVUE-M 200) INJECTION 41%
1.0000 mL | Freq: Once | INTRAMUSCULAR | Status: AC
  Administered 2018-11-26: 1 mL via EPIDURAL

## 2018-11-26 NOTE — Discharge Instructions (Signed)

## 2018-11-27 ENCOUNTER — Telehealth: Payer: Self-pay

## 2018-11-27 ENCOUNTER — Ambulatory Visit: Payer: 59 | Admitting: Internal Medicine

## 2018-11-27 ENCOUNTER — Encounter: Payer: Self-pay | Admitting: Internal Medicine

## 2018-11-27 VITALS — BP 150/80 | HR 106 | Temp 98.4°F | Ht 72.0 in | Wt 230.0 lb

## 2018-11-27 DIAGNOSIS — J22 Unspecified acute lower respiratory infection: Secondary | ICD-10-CM

## 2018-11-27 DIAGNOSIS — R05 Cough: Secondary | ICD-10-CM

## 2018-11-27 DIAGNOSIS — J029 Acute pharyngitis, unspecified: Secondary | ICD-10-CM | POA: Diagnosis not present

## 2018-11-27 DIAGNOSIS — R030 Elevated blood-pressure reading, without diagnosis of hypertension: Secondary | ICD-10-CM

## 2018-11-27 DIAGNOSIS — R059 Cough, unspecified: Secondary | ICD-10-CM

## 2018-11-27 LAB — POCT RAPID STREP A (OFFICE): Rapid Strep A Screen: NEGATIVE

## 2018-11-27 MED ORDER — HYDROCODONE-HOMATROPINE 5-1.5 MG/5ML PO SYRP: 5.0000 mL | ORAL_SOLUTION | Freq: Three times a day (TID) | ORAL | 0 refills | Status: DC | PRN

## 2018-11-27 MED ORDER — CEFTRIAXONE SODIUM 1 G IJ SOLR
1.0000 g | Freq: Once | INTRAMUSCULAR | Status: AC
  Administered 2018-11-27: 1 g via INTRAMUSCULAR

## 2018-11-27 MED ORDER — OSELTAMIVIR PHOSPHATE 75 MG PO CAPS: 75.0000 mg | ORAL_CAPSULE | Freq: Two times a day (BID) | ORAL | 0 refills | Status: DC

## 2018-11-27 MED ORDER — LEVOFLOXACIN 500 MG PO TABS: 500.0000 mg | ORAL_TABLET | Freq: Every day | ORAL | 0 refills | Status: DC

## 2018-11-27 NOTE — Patient Instructions (Addendum)
Rocephin one gram IM. Hycodan one tsp po q 8 hours prn cough. levaquin 500 mg daily x 10 days.  Needs physical exam in the near future.  Addendum: Tamiflu called in for him for 5 days since wife tested positive for flu

## 2018-11-27 NOTE — Telephone Encounter (Signed)
Send in Tamiflu 75 mg twice a day x 5 days

## 2018-11-27 NOTE — Telephone Encounter (Signed)
Patient called states his wife went to see her doctor and was positive for the flu he wants to know if he needs to start on tamiflu?

## 2018-11-27 NOTE — Progress Notes (Signed)
   Subjective:    Patient ID: [name] [name], male    DOB: 27-Feb-1956, 62 y.o.   MRN: [id number]  HPI 62 year old male has been traveling out of state and has come down with respiratory infection symptoms.  Flew to [place].  Wife is come down with respiratory infection symptoms as well.  Has sore throat hoarseness cough and congestion.    Review of Systems see above     Objective:   Physical Exam Pharynx is red.  Rapid strep screen is negative.  TMs are clear neck supple chest clear.  He is hoarse when he speaks       Assessment & Plan:  Acute pharyngitis-rapid strep screen negative  Cough and acute lower respiratory infection  Elevated blood pressure reading-patient needs to monitor at home and call if persistently elevated  Plan: Levaquin 500 mg daily for 10 days.  He needs physical exam in the near future.  Hycodan 1 teaspoon p.o. every 8 hours as needed cough.  Rocephin 1 g IM.  Addendum: Patient called back and said wife is been to the doctor and was diagnosed with influenza.  We have called in Tamiflu 75 mg twice daily for 5 days for him.

## 2018-11-27 NOTE — Telephone Encounter (Signed)
Done

## 2018-12-10 ENCOUNTER — Ambulatory Visit: Payer: 59 | Admitting: Internal Medicine

## 2018-12-10 ENCOUNTER — Telehealth: Payer: Self-pay | Admitting: Internal Medicine

## 2018-12-10 VITALS — BP 128/72 | HR 69 | Temp 97.7°F | Resp 16 | Wt 223.0 lb

## 2018-12-10 DIAGNOSIS — K5792 Diverticulitis of intestine, part unspecified, without perforation or abscess without bleeding: Secondary | ICD-10-CM

## 2018-12-10 MED ORDER — METRONIDAZOLE 500 MG PO TABS: 500.0000 mg | ORAL_TABLET | Freq: Three times a day (TID) | ORAL | 0 refills | Status: DC

## 2018-12-10 MED ORDER — CIPROFLOXACIN HCL 500 MG PO TABS: 500.0000 mg | ORAL_TABLET | Freq: Two times a day (BID) | ORAL | 0 refills | Status: DC

## 2018-12-10 NOTE — Telephone Encounter (Signed)
Spoke with patient and provided appointment for today at 4:15.  Patient confirmed.

## 2018-12-10 NOTE — Progress Notes (Signed)
   Subjective:    Patient ID: [name] [name], male    DOB: 09-24-1956, 62 y.o.   MRN: [id number]  HPI  62 year old Male with recurrent left lower quadrant abdominal pain.  Longstanding history of recurrent diverticulitis.  Last had CT of the abdomen and pelvis in 2018 when he had had abdominal pain for 9 days.  At that time he had no abnormality detected and abdomen or pelvis other than colonic diverticulosis.  In 2010 he had a CT of the abdomen and pelvis which was also negative for acute diverticulitis however in 2007 he did have a CT showing mild sigmoid colonic diverticulitis.  He has been traveling a lot recently.  Was just seen December 3 after a flu exposure and acute lower respiratory infection.  At that time he was treated with Levaquin for 10 days and given 1 g IM Rocephin.  He denies nausea.  No vomiting.  Just left lower quadrant abdominal pain.  Last colonoscopy was in 2005 and he is past due for surveillance colonoscopy.    Review of Systems see above     Objective:   Physical Exam  Abdomen is soft nondistended without hepatosplenomegaly.  He has mild tenderness in the left lower quadrant with slight rebound tenderness.  He is in no acute distress.      Assessment & Plan:  Probable mild diverticulitis  Plan: He will take Cipro 500 mg twice daily for 10 days and Flagyl 500 mg 3 times a day for 10 days.  He needs to be seen by gastroenterologist in the near future regarding surveillance colonoscopy.  This was last done in 2010 at [facility].

## 2018-12-10 NOTE — Telephone Encounter (Signed)
Patient states he is having a flare up with his diverticulitis.  States he has some Cipro and Flagyl, but they are old scripts from when [facility] was open, so he's afraid to take those.  He is not running any fever and states he's catching it on the front end, so it's not that bad at the present.  He's hoping to get some medication started before it gets too bad.  He wants to know if you would mind to call in new Rx's for him?  [facility] [address] [facility] [place]  Phone:  [phone]  Thank you.

## 2018-12-10 NOTE — Telephone Encounter (Signed)
He needs a visit.

## 2018-12-20 DIAGNOSIS — M5416 Radiculopathy, lumbar region: Secondary | ICD-10-CM | POA: Diagnosis not present

## 2018-12-20 DIAGNOSIS — M545 Low back pain: Secondary | ICD-10-CM | POA: Diagnosis not present

## 2018-12-20 DIAGNOSIS — M25551 Pain in right hip: Secondary | ICD-10-CM | POA: Diagnosis not present

## 2018-12-22 ENCOUNTER — Encounter: Payer: Self-pay | Admitting: Internal Medicine

## 2018-12-22 NOTE — Patient Instructions (Signed)
Clear liquids and advance diet slowly over the next 7 to 10 days.  Take Cipro and Flagyl as prescribed.  You need surveillance colonoscopy in the near future once this episode improves.

## 2018-12-24 ENCOUNTER — Other Ambulatory Visit: Payer: Self-pay | Admitting: Internal Medicine

## 2018-12-24 DIAGNOSIS — M5416 Radiculopathy, lumbar region: Secondary | ICD-10-CM | POA: Diagnosis not present

## 2018-12-24 DIAGNOSIS — M25551 Pain in right hip: Secondary | ICD-10-CM | POA: Diagnosis not present

## 2018-12-24 DIAGNOSIS — M545 Low back pain: Secondary | ICD-10-CM | POA: Diagnosis not present

## 2019-01-04 DIAGNOSIS — M5136 Other intervertebral disc degeneration, lumbar region: Secondary | ICD-10-CM | POA: Diagnosis not present

## 2019-01-04 DIAGNOSIS — M5441 Lumbago with sciatica, right side: Secondary | ICD-10-CM | POA: Diagnosis not present

## 2019-01-04 DIAGNOSIS — M5416 Radiculopathy, lumbar region: Secondary | ICD-10-CM | POA: Diagnosis not present

## 2019-01-07 ENCOUNTER — Other Ambulatory Visit: Payer: Self-pay | Admitting: Physical Medicine and Rehabilitation

## 2019-01-07 DIAGNOSIS — M5416 Radiculopathy, lumbar region: Secondary | ICD-10-CM

## 2019-01-07 DIAGNOSIS — M25551 Pain in right hip: Secondary | ICD-10-CM

## 2019-01-09 ENCOUNTER — Other Ambulatory Visit: Payer: Self-pay | Admitting: Internal Medicine

## 2019-01-10 ENCOUNTER — Telehealth: Payer: Self-pay | Admitting: Internal Medicine

## 2019-01-10 ENCOUNTER — Encounter: Payer: Self-pay | Admitting: Internal Medicine

## 2019-01-10 MED ORDER — ALPRAZOLAM 0.5 MG PO TABS: ORAL_TABLET | ORAL | 0 refills | Status: DC

## 2019-01-10 MED ORDER — ALPRAZOLAM 0.5 MG PO TABS: ORAL_TABLET | ORAL | 5 refills | Status: DC

## 2019-01-10 NOTE — Telephone Encounter (Signed)
Needs appt for CPE

## 2019-01-10 NOTE — Telephone Encounter (Signed)
Needs CPE 

## 2019-01-11 NOTE — Telephone Encounter (Signed)
Sent secure e-mail to patient asking him to call the office for a CPE appointment and then you'll be happy to fill his medication request.

## 2019-01-15 ENCOUNTER — Telehealth: Payer: Self-pay | Admitting: Internal Medicine

## 2019-01-15 NOTE — Telephone Encounter (Signed)
Called to try and set up pt for CPE visit since it has been a while. He said he is out of town and will call back to schedule when he gets back in town

## 2019-01-21 ENCOUNTER — Ambulatory Visit
Admission: RE | Admit: 2019-01-21 | Discharge: 2019-01-21 | Disposition: A | Discharge status: Home / Self Care | Payer: 59 | Source: Ambulatory Visit | Attending: Physical Medicine and Rehabilitation | Admitting: Physical Medicine and Rehabilitation

## 2019-01-21 DIAGNOSIS — M545 Low back pain: Secondary | ICD-10-CM | POA: Diagnosis not present

## 2019-01-21 DIAGNOSIS — M25551 Pain in right hip: Secondary | ICD-10-CM | POA: Diagnosis not present

## 2019-01-21 DIAGNOSIS — M5416 Radiculopathy, lumbar region: Secondary | ICD-10-CM

## 2019-01-23 DIAGNOSIS — M5441 Lumbago with sciatica, right side: Secondary | ICD-10-CM | POA: Diagnosis not present

## 2019-01-23 DIAGNOSIS — M5416 Radiculopathy, lumbar region: Secondary | ICD-10-CM | POA: Diagnosis not present

## 2019-01-23 DIAGNOSIS — M5136 Other intervertebral disc degeneration, lumbar region: Secondary | ICD-10-CM | POA: Diagnosis not present

## 2019-01-24 ENCOUNTER — Ambulatory Visit (INDEPENDENT_AMBULATORY_CARE_PROVIDER_SITE_OTHER): Payer: 59 | Admitting: Orthopaedic Surgery

## 2019-01-24 ENCOUNTER — Encounter (INDEPENDENT_AMBULATORY_CARE_PROVIDER_SITE_OTHER): Payer: Self-pay | Admitting: Orthopaedic Surgery

## 2019-01-24 ENCOUNTER — Ambulatory Visit (INDEPENDENT_AMBULATORY_CARE_PROVIDER_SITE_OTHER): Payer: Self-pay

## 2019-01-24 ENCOUNTER — Other Ambulatory Visit: Payer: Self-pay | Admitting: Physical Medicine and Rehabilitation

## 2019-01-24 DIAGNOSIS — M1611 Unilateral primary osteoarthritis, right hip: Secondary | ICD-10-CM

## 2019-01-24 DIAGNOSIS — M25551 Pain in right hip: Secondary | ICD-10-CM

## 2019-01-24 DIAGNOSIS — M5416 Radiculopathy, lumbar region: Secondary | ICD-10-CM

## 2019-01-24 NOTE — Progress Notes (Signed)
Office Visit Note   Patient: [name] [name]           Date of Birth: [birth year]           MRN: [id number] Visit Date: 01/24/2019              Requested by: Elby Showers, MD [address] [place], [place] [id number] PCP: Elby Showers, MD   Assessment & Plan: Visit Diagnoses:  1. Pain in right hip   2. Unilateral primary osteoarthritis, right hip     Plan: I went over his MRIs and plain film with him in detail as well as his clinical exam.  At this point we are recommending a total hip arthroplasty.  His pain is daily and it is detrimentally affect his mobility, his quality of life and his activities daily living.  Separating his lumbar spine as well.  I went over his x-rays with him in detail and explained what hip replacement surgery involves.  We talked about the risk and benefits of surgery.  We talked in detail about his intraoperative and postoperative course and what to expect.  I gave him a handout by hip placement as well and our surgery scheduler's card.  All question concerns were answered and addressed.  We will call you soon to work on getting this on our schedule.  He is actually having epidural steroid injections in his lumbar spine next week.  Follow-Up Instructions: Return for 2 weeks post-op.   Orders:  Orders Placed This Encounter  Procedures  . XR HIP UNILAT W OR W/O PELVIS 1V RIGHT   No orders of the defined types were placed in this encounter.     Procedures: No procedures performed   Clinical Data: No additional findings.   Subjective: Chief Complaint  Patient presents with  . Left Hip - Pain  The patient is someone I seen in the past.  About 3 or 4 years ago he got an intra-articular injection in his right hip and this helped him for a long period time.  At this point though his right hip pain is gotten significantly severe.  It is 10 out of 10 on a daily basis.  It is affecting his mobility, his quality of life and his activities daily  living detrimentally.  He cannot cross his leg well from that side and cannot put his shoes and socks on well.  He has significant pain in the groin as well.  He also has known disc disease at L2-L3 and L3-L4 on the right side and has an epidural scheduled for next week.  It is gotten to where he is ready for hip replacement surgery.  He is not a diabetic or smoker.  HPI  Review of Systems He currently denies any headache, chest pain, shortness of breath, fever, chills, nausea, vomiting.  Objective: Vital Signs: There were no vitals taken for this visit.  Physical Exam He is alert and orient x3 and in no acute distress Ortho Exam Examination of his right hip shows severe pain with internal and external rotation and actually stiffness with internal and external rotation to the point that I cannot fully rotate the hip.  His pain is severe in the groin.  His left hip moves fluidly. Specialty Comments:  No specialty comments available.  Imaging: Xr Hip Unilat W Or W/o Pelvis 1v Right  Result Date: 01/24/2019 An AP pelvis and lateral the right hip show severe end-stage arthritis of the right hip.  There  is complete loss of superior lateral joint space.  There are sclerotic changes in the femoral head and acetabulum.  There are large particular osteophytes around the hip as well.    PMFS History: Patient Active Problem List   Diagnosis Date Noted  . Unilateral primary osteoarthritis, right hip 01/24/2019  . GERD (gastroesophageal reflux disease) 09/16/2013  . HTN (hypertension) 06/07/2011  . Irritable bowel 06/07/2011  . DIVERTICULITIS-COLON 11/27/2008   History reviewed. No pertinent past medical history.  Family History  Problem Relation Age of Onset  . Cancer Father     Past Surgical History:  Procedure Laterality Date  . renal stone     Social History   Occupational History  . Not on file  Tobacco Use  . Smoking status: Never Smoker  . Smokeless tobacco: Never Used    Substance and Sexual Activity  . Alcohol use: Yes    Alcohol/week: 2.0 - 3.0 standard drinks    Types: 2 - 3 Shots of liquor per week  . Drug use: No  . Sexual activity: Yes

## 2019-01-29 ENCOUNTER — Ambulatory Visit: Payer: 59 | Admitting: Internal Medicine

## 2019-01-29 ENCOUNTER — Encounter: Payer: Self-pay | Admitting: Internal Medicine

## 2019-01-29 VITALS — BP 130/80 | HR 88 | Temp 98.5°F | Ht 72.0 in | Wt 227.0 lb

## 2019-01-29 DIAGNOSIS — I10 Essential (primary) hypertension: Secondary | ICD-10-CM

## 2019-01-29 DIAGNOSIS — F411 Generalized anxiety disorder: Secondary | ICD-10-CM | POA: Diagnosis not present

## 2019-01-29 DIAGNOSIS — M1611 Unilateral primary osteoarthritis, right hip: Secondary | ICD-10-CM | POA: Diagnosis not present

## 2019-01-30 ENCOUNTER — Other Ambulatory Visit: Payer: Self-pay | Admitting: Physical Medicine and Rehabilitation

## 2019-01-30 ENCOUNTER — Telehealth: Payer: Self-pay

## 2019-01-30 ENCOUNTER — Other Ambulatory Visit: Payer: Self-pay | Admitting: Internal Medicine

## 2019-01-30 DIAGNOSIS — M25551 Pain in right hip: Secondary | ICD-10-CM

## 2019-01-30 NOTE — Telephone Encounter (Signed)
Please call pt and tell him this. He wants to get hip surgery ASAP. Also, please call [facility] and ask when he is scheduled for hip replacement. Pt is calling here asking [place].

## 2019-01-30 NOTE — Telephone Encounter (Signed)
I have left a message for [name] [name] she is Dr. Trevor Mace surgery scheduler to call us back with this info, patient stated he also has left multiple messages for her and hasn't heard back.

## 2019-01-30 NOTE — Telephone Encounter (Signed)
Roberta from [facility] called back regarding the epidural injection, patient can only get one epidural injection at a time then he will need to wait at least 2 weeks until he can get another injection. I have ordered an epidural injection through epic and Angelita Ingles will get patient set up to get his right hip injected 2 weeks after he gets his back injected.

## 2019-01-31 ENCOUNTER — Inpatient Hospital Stay: Admission: RE | Admit: 2019-01-31 | Payer: 59 | Source: Ambulatory Visit

## 2019-01-31 ENCOUNTER — Ambulatory Visit
Admission: RE | Admit: 2019-01-31 | Discharge: 2019-01-31 | Disposition: A | Discharge status: Home / Self Care | Payer: 59 | Source: Ambulatory Visit | Attending: Internal Medicine | Admitting: Internal Medicine

## 2019-01-31 DIAGNOSIS — M25551 Pain in right hip: Secondary | ICD-10-CM | POA: Diagnosis not present

## 2019-01-31 MED ORDER — METHYLPREDNISOLONE ACETATE 40 MG/ML INJ SUSP (RADIOLOG
120.0000 mg | Freq: Once | INTRAMUSCULAR | Status: AC
  Administered 2019-01-31: 120 mg via INTRA_ARTICULAR

## 2019-01-31 MED ORDER — IOPAMIDOL (ISOVUE-M 200) INJECTION 41%
1.0000 mL | Freq: Once | INTRAMUSCULAR | Status: AC
  Administered 2019-01-31: 1 mL via INTRA_ARTICULAR

## 2019-02-17 ENCOUNTER — Encounter: Payer: Self-pay | Admitting: Internal Medicine

## 2019-02-17 MED ORDER — ALPRAZOLAM 0.5 MG PO TABS: ORAL_TABLET | ORAL | 0 refills | Status: DC

## 2019-02-17 NOTE — Patient Instructions (Signed)
Ordered steroid injection of right hip.  Xanax refill.  Continue antihypertensive medications.  Physical exam scheduled for March 17

## 2019-02-17 NOTE — Progress Notes (Signed)
   Subjective:    Patient ID: [name] [name], male    DOB: 17-Jan-1956, 63 y.o.   MRN: [id number]  HPI 63 year old Male in today just for discussion.  He has right hip pain.  He has seen Dr. Ninfa Linden who has recommended right hip arthroplasty.  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  He has been seeing Dr. Ahmed Prima at [facility] for degenerative disc disease in his back.  Was diagnosed with lumbar radiculitis on the right with right-sided sciatica.  Has gotten an epidural steroid injection at [facility] imaging on December 2 which helped somewhat.  However in January he had MR of the right hip showing full thickness cartilage loss over the right superior lateral acetabulum and femoral head with mild subchondral marrow edema.  Also has mild partial-thickness cartilage loss over the left superior lateral acetabulum and femoral head without subchondral signal abnormality.  He also has a tear of the right superior labral. He has essential hypertension.  Blood pressure today is 130/80.   Review of Systems     Objective:   Physical Exam Not examined but spent 15 minutes speaking with him about these issues.  He needs his antianxiety medication refilled.  He is wondering if I can order a hip injection at [facility] imaging.  We will place the order and make a telephone call.  He says Dr. Ninfa Linden suggested this.  [facility] tells [place] they cannot do epidural injection and hip injection on the same day.  He prefers to get hip injection.       Assessment & Plan:  Right hip osteoarthritis-to have hip replacement in the near future.  To get hip injection with steroids February 6.  Anxiety-refill Xanax  Essential hypertension- okay to refill antihypertensive medications.  He needs physical exam in the future once he gets past his hip surgery.  Has not had lipids checked since 2017.  Addendum: Physical exam scheduled for March 17

## 2019-03-06 ENCOUNTER — Other Ambulatory Visit (INDEPENDENT_AMBULATORY_CARE_PROVIDER_SITE_OTHER): Payer: Self-pay | Admitting: Physician Assistant

## 2019-03-08 NOTE — Progress Notes (Signed)
Need orders in epic for 3-27 surgery pre op is 3-23

## 2019-03-12 ENCOUNTER — Encounter: Payer: 59 | Admitting: Internal Medicine

## 2019-03-15 ENCOUNTER — Other Ambulatory Visit (HOSPITAL_COMMUNITY): Payer: 59

## 2019-03-18 ENCOUNTER — Inpatient Hospital Stay (HOSPITAL_COMMUNITY): Admission: RE | Admit: 2019-03-18 | Payer: 59 | Source: Ambulatory Visit

## 2019-03-22 ENCOUNTER — Inpatient Hospital Stay: Admit: 2019-03-22 | Payer: 59 | Admitting: Orthopaedic Surgery

## 2019-03-22 SURGERY — ARTHROPLASTY, HIP, TOTAL, ANTERIOR APPROACH
Anesthesia: Spinal | Laterality: Right

## 2019-04-04 ENCOUNTER — Telehealth: Payer: Self-pay | Admitting: Internal Medicine

## 2019-04-04 NOTE — Telephone Encounter (Signed)
LVM to CB and reschedule appointment 

## 2019-04-16 NOTE — Telephone Encounter (Signed)
CB and scheduled CPE and Labs

## 2019-04-26 DIAGNOSIS — C801 Malignant (primary) neoplasm, unspecified: Secondary | ICD-10-CM

## 2019-04-26 HISTORY — DX: Malignant (primary) neoplasm, unspecified: C80.1

## 2019-04-29 ENCOUNTER — Encounter: Payer: Self-pay | Admitting: Internal Medicine

## 2019-04-29 DIAGNOSIS — C44622 Squamous cell carcinoma of skin of right upper limb, including shoulder: Secondary | ICD-10-CM | POA: Diagnosis not present

## 2019-04-29 DIAGNOSIS — L570 Actinic keratosis: Secondary | ICD-10-CM | POA: Diagnosis not present

## 2019-05-01 ENCOUNTER — Other Ambulatory Visit: Payer: Self-pay | Admitting: Physician Assistant

## 2019-05-01 ENCOUNTER — Other Ambulatory Visit: Payer: Self-pay

## 2019-05-06 ENCOUNTER — Other Ambulatory Visit (HOSPITAL_COMMUNITY): Payer: Self-pay | Admitting: *Deleted

## 2019-05-06 NOTE — Patient Instructions (Signed)
[name] [name]  05/06/2019   Your procedure is scheduled on: 05-10-2019  Report to [facility] Main  Entrance  Report to [place]  at  530 AM   YOU NEED TO HAVE A COVID 19 TEST  TODAY AFTER YOUR PRE OP APPOINTMENT. THIS TEST MUST BE DONE BEFORE SURGERY, COME TO [facility] ENTRANCE BETWEEN THE HOURS OF 900 AM AND 300 PM TODAY.   Call this number if you have problems the morning of surgery [phone]    Remember:  :After Midnight. BRUSH YOUR TEETH MORNING OF SURGERY AND RINSE YOUR MOUTH OUT, NO CHEWING GUM CANDY OR MINTS.   NO SOLID FOOD AFTER MIDNIGHT THE NIGHT PRIOR TO SURGERY. NOTHING BY MOUTH EXCEPT CLEAR LIQUIDS UNTIL 430 AM.  PLEASE FINISH ENSURE DRINK PER SURGEON  WHICH NEEDS TO BE COMPLETED AT 430 AM.     CLEAR LIQUID DIET   Foods Allowed                                                                     Foods Excluded  Coffee and tea, regular and decaf                             liquids that you cannot  Plain Jell-O in any flavor                                             see through such as: Fruit ices (not with fruit pulp)                                     milk, soups, orange juice  Iced Popsicles                                    All solid food Carbonated beverages, regular and diet                                    Cranberry, grape and apple juices Sports drinks like Gatorade Lightly seasoned clear broth or consume(fat free) Sugar, honey syrup  Sample Menu Breakfast                                Lunch                                     Supper Cranberry juice                    Beef broth  Chicken broth Jell-O                                     Grape juice                           Apple juice Coffee or tea                        Jell-O                                      Popsicle                                                Coffee or tea                        Coffee or  tea  _____________________________________________________________________    Take these medicines the morning of surgery with A SIP OF WATER: NONE           You may not have any metal on your body including hair pins and               piercings  Do not wear jewelry, make-up, lotions, powders or perfumes, deodorant             Do not wear nail polish.  Do not shave  48 hours prior to surgery.              Men may shave face and neck.   Do not bring valuables to the hospital. [place].  Contacts, dentures or bridgework may not be worn into surgery.  Leave suitcase in the car. After surgery it may be brought to your room.    _____________________________________________________________________             [facility] - Preparing for Surgery Before surgery, you can play an important role.  Because skin is not sterile, your skin needs to be as free of germs as possible.  You can reduce the number of germs on your skin by washing with CHG (chlorahexidine gluconate) soap before surgery.  CHG is an antiseptic cleaner which kills germs and bonds with the skin to continue killing germs even after washing. Please DO NOT use if you have an allergy to CHG or antibacterial soaps.  If your skin becomes reddened/irritated stop using the CHG and inform your nurse when you arrive at Short Stay. Do not shave (including legs and underarms) for at least 48 hours prior to the first CHG shower.  You may shave your face/neck. Please follow these instructions carefully:  1.  Shower with CHG Soap the night before surgery and the  morning of Surgery.  2.  If you choose to wash your hair, wash your hair first as usual with your  normal  shampoo.  3.  After you shampoo, rinse your hair and body thoroughly to remove the  shampoo.  4.  Use CHG as you would any other liquid soap.  You can apply chg directly  to the skin and wash                        Gently with a scrungie or clean washcloth.  5.  Apply the CHG Soap to your body ONLY FROM THE NECK DOWN.   Do not use on face/ open                           Wound or open sores. Avoid contact with eyes, ears mouth and genitals (private parts).                       Wash face,  Genitals (private parts) with your normal soap.             6.  Wash thoroughly, paying special attention to the area where your surgery  will be performed.  7.  Thoroughly rinse your body with warm water from the neck down.  8.  DO NOT shower/wash with your normal soap after using and rinsing off  the CHG Soap.                9.  Pat yourself dry with a clean towel.            10.  Wear clean pajamas.            11.  Place clean sheets on your bed the night of your first shower and do not  sleep with pets. Day of Surgery : Do not apply any lotions/deodorants the morning of surgery.  Please wear clean clothes to the hospital/surgery center.  FAILURE TO FOLLOW THESE INSTRUCTIONS MAY RESULT IN THE CANCELLATION OF YOUR SURGERY PATIENT SIGNATURE_________________________________  NURSE SIGNATURE__________________________________  ________________________________________________________________________   [name] [name]  An incentive spirometer is a tool that can help keep your lungs clear and active. This tool measures how well you are filling your lungs with each breath. Taking long deep breaths may help reverse or decrease the chance of developing breathing (pulmonary) problems (especially infection) following:  A long period of time when you are unable to move or be active. BEFORE THE PROCEDURE   If the spirometer includes an indicator to show your best effort, your nurse or respiratory therapist will set it to a desired goal.  If possible, sit up straight or lean slightly forward. Try not to slouch.  Hold the incentive spirometer in an upright position. INSTRUCTIONS FOR USE  1. Sit on the edge of  your bed if possible, or sit up as far as you can in bed or on a chair. 2. Hold the incentive spirometer in an upright position. 3. Breathe out normally. 4. Place the mouthpiece in your mouth and seal your lips tightly around it. 5. Breathe in slowly and as deeply as possible, raising the piston or the ball toward the top of the column. 6. Hold your breath for 3-5 seconds or for as long as possible. Allow the piston or ball to fall to the bottom of the column. 7. Remove the mouthpiece from your mouth and breathe out normally. 8. Rest for a few seconds and repeat Steps 1 through 7 at least 10 times every 1-2 hours when you are awake. Take your time and take a few normal breaths between deep breaths. 9. The spirometer may include an indicator to  show your best effort. Use the indicator as a goal to work toward during each repetition. 10. After each set of 10 deep breaths, practice coughing to be sure your lungs are clear. If you have an incision (the cut made at the time of surgery), support your incision when coughing by placing a pillow or rolled up towels firmly against it. Once you are able to get out of bed, walk around indoors and cough well. You may stop using the incentive spirometer when instructed by your caregiver.  RISKS AND COMPLICATIONS  Take your time so you do not get dizzy or light-headed.  If you are in pain, you may need to take or ask for pain medication before doing incentive spirometry. It is harder to take a deep breath if you are having pain. AFTER USE  Rest and breathe slowly and easily.  It can be helpful to keep track of a log of your progress. Your caregiver can provide you with a simple table to help with this. If you are using the spirometer at home, follow these instructions: [place] IF:   You are having difficultly using the spirometer.  You have trouble using the spirometer as often as instructed.  Your pain medication is not giving enough relief  while using the spirometer.  You develop fever of 100.5 F (38.1 C) or higher. SEEK IMMEDIATE MEDICAL CARE IF:   You cough up bloody sputum that had not been present before.  You develop fever of 102 F (38.9 C) or greater.  You develop worsening pain at or near the incision site. MAKE SURE YOU:   Understand these instructions.  Will watch your condition.  Will get help right away if you are not doing well or get worse. Document Released: 04/24/2007 Document Revised: 03/05/2012 Document Reviewed: 06/25/2007 [facility] Patient Information 2014 [place], [place].   ________________________________________________________________________

## 2019-05-07 ENCOUNTER — Encounter (HOSPITAL_COMMUNITY)
Admission: RE | Admit: 2019-05-07 | Discharge: 2019-05-07 | Disposition: A | Discharge status: Home / Self Care | Payer: 59 | Source: Ambulatory Visit | Attending: Orthopaedic Surgery | Admitting: Orthopaedic Surgery

## 2019-05-07 ENCOUNTER — Other Ambulatory Visit (HOSPITAL_COMMUNITY)
Admission: RE | Admit: 2019-05-07 | Discharge: 2019-05-07 | Disposition: A | Discharge status: Home / Self Care | Payer: 59 | Source: Ambulatory Visit

## 2019-05-07 ENCOUNTER — Other Ambulatory Visit: Payer: Self-pay

## 2019-05-07 ENCOUNTER — Encounter: Payer: Self-pay | Admitting: Internal Medicine

## 2019-05-07 ENCOUNTER — Encounter (HOSPITAL_COMMUNITY): Payer: Self-pay

## 2019-05-07 ENCOUNTER — Other Ambulatory Visit: Payer: 59 | Admitting: Internal Medicine

## 2019-05-07 ENCOUNTER — Ambulatory Visit (INDEPENDENT_AMBULATORY_CARE_PROVIDER_SITE_OTHER): Payer: 59 | Admitting: Internal Medicine

## 2019-05-07 VITALS — BP 140/90 | HR 77 | Temp 98.5°F | Wt 229.0 lb

## 2019-05-07 DIAGNOSIS — Z8719 Personal history of other diseases of the digestive system: Secondary | ICD-10-CM

## 2019-05-07 DIAGNOSIS — I10 Essential (primary) hypertension: Secondary | ICD-10-CM

## 2019-05-07 DIAGNOSIS — Z125 Encounter for screening for malignant neoplasm of prostate: Secondary | ICD-10-CM

## 2019-05-07 DIAGNOSIS — Z0000 Encounter for general adult medical examination without abnormal findings: Secondary | ICD-10-CM

## 2019-05-07 DIAGNOSIS — Z8659 Personal history of other mental and behavioral disorders: Secondary | ICD-10-CM

## 2019-05-07 DIAGNOSIS — F411 Generalized anxiety disorder: Secondary | ICD-10-CM

## 2019-05-07 DIAGNOSIS — Z6831 Body mass index (BMI) 31.0-31.9, adult: Secondary | ICD-10-CM

## 2019-05-07 DIAGNOSIS — Z1159 Encounter for screening for other viral diseases: Secondary | ICD-10-CM | POA: Insufficient documentation

## 2019-05-07 DIAGNOSIS — M1611 Unilateral primary osteoarthritis, right hip: Secondary | ICD-10-CM

## 2019-05-07 DIAGNOSIS — Z01818 Encounter for other preprocedural examination: Secondary | ICD-10-CM

## 2019-05-07 DIAGNOSIS — E781 Pure hyperglyceridemia: Secondary | ICD-10-CM | POA: Diagnosis not present

## 2019-05-07 DIAGNOSIS — E119 Type 2 diabetes mellitus without complications: Secondary | ICD-10-CM

## 2019-05-07 HISTORY — DX: Anxiety disorder, unspecified: F41.9

## 2019-05-07 HISTORY — DX: Personal history of urinary calculi: Z87.442

## 2019-05-07 HISTORY — DX: Personal history of other diseases of the digestive system: Z87.19

## 2019-05-07 HISTORY — DX: Gastro-esophageal reflux disease without esophagitis: K21.9

## 2019-05-07 HISTORY — DX: Unspecified osteoarthritis, unspecified site: M19.90

## 2019-05-07 LAB — SURGICAL PCR SCREEN
MRSA, PCR: NEGATIVE
Staphylococcus aureus: NEGATIVE

## 2019-05-07 MED ORDER — OLMESARTAN MEDOXOMIL 20 MG PO TABS: 20.0000 mg | ORAL_TABLET | Freq: Every day | ORAL | 1 refills | Status: DC

## 2019-05-07 NOTE — Progress Notes (Signed)
Subjective:    Patient ID: [name] [name], male    DOB: 1956/07/22, 63 y.o.   MRN: [id number]  HPI 63 year old White Male seen in office today for health maintenance exam, evaluation of medical issues and medical clearance for hip arthroplasty to be done on Friday, May 15 by Dr. Ninfa Linden.  Patient has a longstanding history of hypertension treated with losartan HCTZ.  Blood pressure is elevated in the office today at 140/90.  He feels that blood pressure has probably been elevated a bit at home.  He would like to try a stronger antihypertensive agent.  Were going to switch him to Benicar 20 mg daily instead of losartan.  He has a history of impaired glucose tolerance.  His hip is been painful and he is not been nearly as active over the past several months.  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  He would like to get back into riding his soon as possible.  A Cardiolite study in October 2003 was negative.  EKG in 2012 was within normal limits and one done today shows no change from the one done in 2012.  Family history: Father died of prostate cancer.  Mother died with complications of dementia and apparently had alcoholism.  His BMI is 31.06.  His hemoglobin A1c is 7.3% and in September 2018 was 5.6%.  Glucose intolerance has been treated with diet until now.  He will be started on Glucotrol XL 2.5 mg daily.  He has a history of irritable bowel syndrome treated with Xanax up to 3 times daily, as needed for diarrhea and sometimes anxiety in addition to as needed Imodium.  History of GE reflux.  Had colonoscopy in 2005 showing diverticulosis.  His total cholesterol was 201, HDL cholesterol 45, triglycerides 170 and LDL cholesterol 127.  He has a history of hypertriglyceridemia.  He also has a history of recurrent diverticulitis with the last episode being December 2019.  From time to time he gets lower respiratory infections  treated with antibiotics.  He does not have asthma.    Review of Systems main complaint is right hip pain.  It is painful for him to ambulate as well as attempt to ride horses.  He rides competitively and would like to get back into this as soon as possible.  He does realize it will take several weeks after the surgery before he can do this.  In the Fall 2019, he suffered an injury to his right median nerve and sprained his left wrist when a horse he was riding at the [place] jumped suddenly injuring his hands.  It took some time for this to improve.  He saw Dr. Leanora Cover.  Social history: He does not smoke.  Social alcohol consumption.  He remarried about a year ago.  Wife works for a [employer].  He inherited the painting business from his father.  Previously married  and has one adult son from that marriage who works with him in the paint company.  One brother.     Objective:   Physical Exam Blood pressure 140/90, pulse 77, temperature 98.5 degrees, pulse oximetry 96, weight 229 pounds, BMI 31.06  Skin warm and dry.  Nodes none.  TMs and pharynx are clear.  Neck is supple without JVD thyromegaly or carotid bruits.  Chest clear to auscultation.  Cardiac exam regular rate and rhythm normal S1 and S2.  Abdomen is  obese soft nondistended without hepatosplenomegaly masses or tenderness.  Prostate is normal without nodules.  His PSA is normal.  Extremities without edema.  No focal deficits on brief neurological exam.       Assessment & Plan:  End-stage osteoarthritis of right hip scheduled for right hip arthroplasty May 15 by Dr. Ninfa Linden  BMI 31.06-needs to diet exercise and lose weight  Essential hypertension-switch losartan to Benicar 20 mg daily and reevaluate in 4 to 6 weeks  Irritable bowel syndrome treated with Xanax and Imodium  History of recurrent diverticulitis  Hypertriglyceridemia  Type 2 diabetes mellitus-previously had mild glucose intolerance  and now has diabetes mellitus.  Will be placed on Glucotrol XL 2.5 mg daily and follow-up in 4 to 6 weeks.  He should purchase a glucose monitor for home use and check Accu-Cheks at least twice daily.  Follow-up with hemoglobin A1c, fasting lipid panel and blood pressure check in 4 to 6 weeks.  Monitor Accu-Cheks at home during recuperation.

## 2019-05-08 LAB — COMPLETE METABOLIC PANEL WITHOUT GFR
AG Ratio: 1.8 (calc) (ref 1.0–2.5)
ALT: 31 U/L (ref 9–46)
AST: 21 U/L (ref 10–35)
Albumin: 4.6 g/dL (ref 3.6–5.1)
Alkaline phosphatase (APISO): 61 U/L (ref 35–144)
BUN: 14 mg/dL (ref 7–25)
CO2: 30 mmol/L (ref 20–32)
Calcium: 10 mg/dL (ref 8.6–10.3)
Chloride: 101 mmol/L (ref 98–110)
Creat: 0.92 mg/dL (ref 0.70–1.25)
GFR, Est African American: 102 mL/min/1.73m2
GFR, Est Non African American: 88 mL/min/1.73m2
Globulin: 2.5 g/dL (ref 1.9–3.7)
Glucose, Bld: 188 mg/dL — ABNORMAL HIGH (ref 65–99)
Potassium: 4 mmol/L (ref 3.5–5.3)
Sodium: 140 mmol/L (ref 135–146)
Total Bilirubin: 0.7 mg/dL (ref 0.2–1.2)
Total Protein: 7.1 g/dL (ref 6.1–8.1)

## 2019-05-08 LAB — LIPID PANEL
Cholesterol: 201 mg/dL — ABNORMAL HIGH (ref <200)
HDL: 45 mg/dL (ref >=40)
LDL Cholesterol (Calc): 127 mg/dL (calc) — ABNORMAL HIGH
Non-HDL Cholesterol (Calc): 156 mg/dL (calc) — ABNORMAL HIGH (ref <130)
Total CHOL/HDL Ratio: 4.5 (calc) (ref <5.0)
Triglycerides: 170 mg/dL — ABNORMAL HIGH (ref <150)

## 2019-05-08 LAB — CBC WITH DIFFERENTIAL/PLATELET
Absolute Monocytes: 321 cells/uL (ref 200–950)
Basophils Absolute: 31 cells/uL (ref 0–200)
Basophils Relative: 0.7 %
Eosinophils Absolute: 62 cells/uL (ref 15–500)
Eosinophils Relative: 1.4 %
HCT: 43.3 % (ref 38.5–50.0)
Hemoglobin: 15.1 g/dL (ref 13.2–17.1)
Lymphs Abs: 1976 cells/uL (ref 850–3900)
MCH: 32.2 pg (ref 27.0–33.0)
MCHC: 34.9 g/dL (ref 32.0–36.0)
MCV: 92.3 fL (ref 80.0–100.0)
MPV: 9.5 fL (ref 7.5–12.5)
Monocytes Relative: 7.3 %
Neutro Abs: 2011 cells/uL (ref 1500–7800)
Neutrophils Relative %: 45.7 %
Platelets: 185 10*3/uL (ref 140–400)
RBC: 4.69 10*6/uL (ref 4.20–5.80)
RDW: 12.6 % (ref 11.0–15.0)
Total Lymphocyte: 44.9 %
WBC: 4.4 10*3/uL (ref 3.8–10.8)

## 2019-05-08 LAB — HEMOGLOBIN A1C
Hgb A1c MFr Bld: 7.3 % of total Hgb — ABNORMAL HIGH (ref <5.7)
Mean Plasma Glucose: 163 (calc)
eAG (mmol/L): 9 (calc)

## 2019-05-08 LAB — PSA: PSA: 1.5 ng/mL (ref <=4.0)

## 2019-05-08 LAB — NOVEL CORONAVIRUS, NAA (HOSP ORDER, SEND-OUT TO REF LAB; TAT 18-24 HRS): SARS-CoV-2, NAA: NOT DETECTED

## 2019-05-09 MED ORDER — GLIPIZIDE ER 2.5 MG PO TB24: 2.5000 mg | ORAL_TABLET | Freq: Every day | ORAL | 1 refills | Status: DC

## 2019-05-09 NOTE — Anesthesia Preprocedure Evaluation (Addendum)
Anesthesia Evaluation  Patient identified by MRN, date of birth, ID band Patient awake    Reviewed: Allergy & Precautions, NPO status , Patient's Chart, lab work & pertinent test results  History of Anesthesia Complications Negative for: history of anesthetic complications  Airway Mallampati: II  TM Distance: >3 FB Neck ROM: Full    Dental no notable dental hx. (+) Teeth Intact   Pulmonary neg pulmonary ROS,    Pulmonary exam normal        Cardiovascular hypertension, Normal cardiovascular exam     Neuro/Psych PSYCHIATRIC DISORDERS Anxiety negative neurological ROS     GI/Hepatic Neg liver ROS, GERD  Medicated,  Endo/Other  diabetes, Type 2, Oral Hypoglycemic Agents  Renal/GU negative Renal ROS  negative genitourinary   Musculoskeletal  (+) Arthritis , Osteoarthritis,    Abdominal   Peds  Hematology negative hematology ROS (+)   Anesthesia Other Findings Plts 185, Hgb 15.1, K 4.0 COVID neg 5/12  Reproductive/Obstetrics                            Anesthesia Physical Anesthesia Plan  ASA: II  Anesthesia Plan: Spinal   Post-op Pain Management:    Induction:   PONV Risk Score and Plan: 1 and Propofol infusion and Treatment may vary due to age or medical condition  Airway Management Planned: Simple Face Mask  Additional Equipment: None  Intra-op Plan:   Post-operative Plan:   Informed Consent: I have reviewed the patients History and Physical, chart, labs and discussed the procedure including the risks, benefits and alternatives for the proposed anesthesia with the patient or authorized representative who has indicated his/her understanding and acceptance.       Plan Discussed with:   Anesthesia Plan Comments:        Anesthesia Quick Evaluation

## 2019-05-10 ENCOUNTER — Inpatient Hospital Stay (HOSPITAL_COMMUNITY)
Admission: RE | Admit: 2019-05-10 | Discharge: 2019-05-11 | DRG: 470 | Disposition: A | Discharge status: Home Health | Payer: 59 | Attending: Orthopaedic Surgery | Admitting: Orthopaedic Surgery

## 2019-05-10 ENCOUNTER — Inpatient Hospital Stay (HOSPITAL_COMMUNITY): Payer: 59 | Admitting: Certified Registered"

## 2019-05-10 ENCOUNTER — Inpatient Hospital Stay (HOSPITAL_COMMUNITY): Payer: 59

## 2019-05-10 ENCOUNTER — Encounter (HOSPITAL_COMMUNITY)
Admission: RE | Disposition: A | Discharge status: Home Health | Payer: Self-pay | Source: Home / Self Care | Attending: Orthopaedic Surgery

## 2019-05-10 ENCOUNTER — Other Ambulatory Visit: Payer: Self-pay

## 2019-05-10 ENCOUNTER — Inpatient Hospital Stay (HOSPITAL_COMMUNITY): Payer: 59 | Admitting: Physician Assistant

## 2019-05-10 ENCOUNTER — Encounter (HOSPITAL_COMMUNITY): Payer: Self-pay | Admitting: *Deleted

## 2019-05-10 DIAGNOSIS — F419 Anxiety disorder, unspecified: Secondary | ICD-10-CM | POA: Diagnosis present

## 2019-05-10 DIAGNOSIS — Z91041 Radiographic dye allergy status: Secondary | ICD-10-CM | POA: Diagnosis not present

## 2019-05-10 DIAGNOSIS — Z87442 Personal history of urinary calculi: Secondary | ICD-10-CM

## 2019-05-10 DIAGNOSIS — Z1159 Encounter for screening for other viral diseases: Secondary | ICD-10-CM

## 2019-05-10 DIAGNOSIS — E119 Type 2 diabetes mellitus without complications: Secondary | ICD-10-CM | POA: Diagnosis present

## 2019-05-10 DIAGNOSIS — I10 Essential (primary) hypertension: Secondary | ICD-10-CM | POA: Diagnosis present

## 2019-05-10 DIAGNOSIS — Z419 Encounter for procedure for purposes other than remedying health state, unspecified: Secondary | ICD-10-CM

## 2019-05-10 DIAGNOSIS — K219 Gastro-esophageal reflux disease without esophagitis: Secondary | ICD-10-CM | POA: Diagnosis present

## 2019-05-10 DIAGNOSIS — K589 Irritable bowel syndrome without diarrhea: Secondary | ICD-10-CM | POA: Diagnosis present

## 2019-05-10 DIAGNOSIS — M25551 Pain in right hip: Secondary | ICD-10-CM | POA: Diagnosis not present

## 2019-05-10 DIAGNOSIS — M1611 Unilateral primary osteoarthritis, right hip: Secondary | ICD-10-CM

## 2019-05-10 DIAGNOSIS — Z96641 Presence of right artificial hip joint: Secondary | ICD-10-CM

## 2019-05-10 HISTORY — DX: Type 2 diabetes mellitus without complications: E11.9

## 2019-05-10 HISTORY — PX: TOTAL HIP ARTHROPLASTY: SHX124

## 2019-05-10 LAB — GLUCOSE, CAPILLARY
Glucose-Capillary: 328 mg/dL — ABNORMAL HIGH (ref 70–99)
Glucose-Capillary: 187 mg/dL — ABNORMAL HIGH (ref 70–99)
Glucose-Capillary: 205 mg/dL — ABNORMAL HIGH (ref 70–99)
Glucose-Capillary: 280 mg/dL — ABNORMAL HIGH (ref 70–99)
Glucose-Capillary: 287 mg/dL — ABNORMAL HIGH (ref 70–99)

## 2019-05-10 SURGERY — ARTHROPLASTY, HIP, TOTAL, ANTERIOR APPROACH
Anesthesia: Spinal | Site: Hip | Laterality: Right

## 2019-05-10 MED ORDER — HYDROCHLOROTHIAZIDE 25 MG PO TABS
25.0000 mg | ORAL_TABLET | Freq: Every day | ORAL | Status: DC
  Administered 2019-05-10 – 2019-05-11 (×2): 25 mg via ORAL
  Filled 2019-05-10 (×2): qty 1

## 2019-05-10 MED ORDER — CHLORHEXIDINE GLUCONATE 4 % EX LIQD: 60.0000 mL | Freq: Once | CUTANEOUS | Status: DC

## 2019-05-10 MED ORDER — ALPRAZOLAM 0.5 MG PO TABS: 0.5000 mg | ORAL_TABLET | Freq: Every day | ORAL | Status: DC

## 2019-05-10 MED ORDER — METHOCARBAMOL 500 MG PO TABS
500.0000 mg | ORAL_TABLET | Freq: Four times a day (QID) | ORAL | Status: DC | PRN
  Administered 2019-05-10 – 2019-05-11 (×3): 500 mg via ORAL
  Filled 2019-05-10 (×4): qty 1

## 2019-05-10 MED ORDER — SODIUM CHLORIDE 0.9 % IV SOLN
INTRAVENOUS | Status: DC | PRN
  Administered 2019-05-10: 20 ug/min via INTRAVENOUS

## 2019-05-10 MED ORDER — OXYCODONE HCL 5 MG PO TABS
5.0000 mg | ORAL_TABLET | ORAL | Status: DC | PRN
  Administered 2019-05-10 (×3): 10 mg via ORAL
  Filled 2019-05-10 (×3): qty 2

## 2019-05-10 MED ORDER — PHENYLEPHRINE 40 MCG/ML (10ML) SYRINGE FOR IV PUSH (FOR BLOOD PRESSURE SUPPORT)
PREFILLED_SYRINGE | INTRAVENOUS | Status: DC | PRN
  Administered 2019-05-10 (×2): 120 ug via INTRAVENOUS

## 2019-05-10 MED ORDER — METHOCARBAMOL 500 MG IVPB - SIMPLE MED
500.0000 mg | Freq: Four times a day (QID) | INTRAVENOUS | Status: DC | PRN
  Filled 2019-05-10: qty 50

## 2019-05-10 MED ORDER — PROPOFOL 500 MG/50ML IV EMUL
INTRAVENOUS | Status: DC | PRN
  Administered 2019-05-10: 135 ug/kg/min via INTRAVENOUS

## 2019-05-10 MED ORDER — PROPOFOL 10 MG/ML IV BOLUS
INTRAVENOUS | Status: AC
  Filled 2019-05-10: qty 20

## 2019-05-10 MED ORDER — PANTOPRAZOLE SODIUM 40 MG PO TBEC
40.0000 mg | DELAYED_RELEASE_TABLET | Freq: Every day | ORAL | Status: DC
  Filled 2019-05-10 (×2): qty 1

## 2019-05-10 MED ORDER — SODIUM CHLORIDE 0.9 % IV SOLN
INTRAVENOUS | Status: DC
  Administered 2019-05-10: 75 mL/h via INTRAVENOUS
  Administered 2019-05-11: 02:00:00 via INTRAVENOUS

## 2019-05-10 MED ORDER — PHENOL 1.4 % MT LIQD: 1.0000 | OROMUCOSAL | Status: DC | PRN

## 2019-05-10 MED ORDER — METOCLOPRAMIDE HCL 5 MG PO TABS
5.0000 mg | ORAL_TABLET | Freq: Three times a day (TID) | ORAL | Status: DC | PRN
  Filled 2019-05-10: qty 2

## 2019-05-10 MED ORDER — DIPHENHYDRAMINE HCL 12.5 MG/5ML PO ELIX: 12.5000 mg | ORAL_SOLUTION | ORAL | Status: DC | PRN

## 2019-05-10 MED ORDER — ONDANSETRON HCL 4 MG PO TABS
4.0000 mg | ORAL_TABLET | Freq: Four times a day (QID) | ORAL | Status: DC | PRN
  Filled 2019-05-10: qty 1

## 2019-05-10 MED ORDER — ACETAMINOPHEN 500 MG PO TABS
1000.0000 mg | ORAL_TABLET | Freq: Once | ORAL | Status: AC
  Administered 2019-05-10: 1000 mg via ORAL
  Filled 2019-05-10: qty 2

## 2019-05-10 MED ORDER — METOCLOPRAMIDE HCL 5 MG/ML IJ SOLN: 5.0000 mg | Freq: Three times a day (TID) | INTRAMUSCULAR | Status: DC | PRN

## 2019-05-10 MED ORDER — LACTATED RINGERS IV SOLN
INTRAVENOUS | Status: DC
  Administered 2019-05-10: 07:00:00 via INTRAVENOUS

## 2019-05-10 MED ORDER — INSULIN ASPART 100 UNIT/ML ~~LOC~~ SOLN
0.0000 [IU] | Freq: Every day | SUBCUTANEOUS | Status: DC
  Administered 2019-05-10: 3 [IU] via SUBCUTANEOUS

## 2019-05-10 MED ORDER — MENTHOL 3 MG MT LOZG: 1.0000 | LOZENGE | OROMUCOSAL | Status: DC | PRN

## 2019-05-10 MED ORDER — BUPIVACAINE IN DEXTROSE 0.75-8.25 % IT SOLN
INTRATHECAL | Status: DC | PRN
  Administered 2019-05-10: 2 mL via INTRATHECAL

## 2019-05-10 MED ORDER — SODIUM CHLORIDE 0.9 % IR SOLN
Status: DC | PRN
  Administered 2019-05-10: 1000 mL

## 2019-05-10 MED ORDER — ONDANSETRON HCL 4 MG/2ML IJ SOLN
INTRAMUSCULAR | Status: DC | PRN
  Administered 2019-05-10: 4 mg via INTRAVENOUS

## 2019-05-10 MED ORDER — CEFAZOLIN SODIUM-DEXTROSE 2-4 GM/100ML-% IV SOLN
2.0000 g | INTRAVENOUS | Status: AC
  Administered 2019-05-10: 2 g via INTRAVENOUS
  Filled 2019-05-10: qty 100

## 2019-05-10 MED ORDER — GLIPIZIDE ER 2.5 MG PO TB24
2.5000 mg | ORAL_TABLET | Freq: Every day | ORAL | Status: DC
  Administered 2019-05-11: 2.5 mg via ORAL
  Filled 2019-05-10: qty 1

## 2019-05-10 MED ORDER — TAMSULOSIN HCL 0.4 MG PO CAPS
0.4000 mg | ORAL_CAPSULE | Freq: Every day | ORAL | Status: DC
  Filled 2019-05-10: qty 1

## 2019-05-10 MED ORDER — DOCUSATE SODIUM 100 MG PO CAPS
100.0000 mg | ORAL_CAPSULE | Freq: Two times a day (BID) | ORAL | Status: DC
  Administered 2019-05-10 – 2019-05-11 (×2): 100 mg via ORAL
  Filled 2019-05-10 (×2): qty 1

## 2019-05-10 MED ORDER — CEFAZOLIN SODIUM-DEXTROSE 1-4 GM/50ML-% IV SOLN
1.0000 g | Freq: Four times a day (QID) | INTRAVENOUS | Status: AC
  Administered 2019-05-10 (×2): 1 g via INTRAVENOUS
  Filled 2019-05-10 (×2): qty 50

## 2019-05-10 MED ORDER — ACETAMINOPHEN 325 MG PO TABS: 325.0000 mg | ORAL_TABLET | Freq: Four times a day (QID) | ORAL | Status: DC | PRN

## 2019-05-10 MED ORDER — INSULIN ASPART 100 UNIT/ML ~~LOC~~ SOLN
0.0000 [IU] | Freq: Three times a day (TID) | SUBCUTANEOUS | Status: DC
  Administered 2019-05-10: 18:00:00 8 [IU] via SUBCUTANEOUS
  Administered 2019-05-11: 3 [IU] via SUBCUTANEOUS

## 2019-05-10 MED ORDER — ALUM & MAG HYDROXIDE-SIMETH 200-200-20 MG/5ML PO SUSP: 30.0000 mL | ORAL | Status: DC | PRN

## 2019-05-10 MED ORDER — POVIDONE-IODINE 10 % EX SWAB: 2.0000 "application " | Freq: Once | CUTANEOUS | Status: DC

## 2019-05-10 MED ORDER — PHENYLEPHRINE 40 MCG/ML (10ML) SYRINGE FOR IV PUSH (FOR BLOOD PRESSURE SUPPORT)
PREFILLED_SYRINGE | INTRAVENOUS | Status: AC
  Filled 2019-05-10: qty 10

## 2019-05-10 MED ORDER — HYDROMORPHONE HCL 1 MG/ML IJ SOLN
0.5000 mg | INTRAMUSCULAR | Status: DC | PRN
  Administered 2019-05-10 – 2019-05-11 (×3): 0.5 mg via INTRAVENOUS
  Filled 2019-05-10 (×4): qty 1

## 2019-05-10 MED ORDER — ONDANSETRON HCL 4 MG/2ML IJ SOLN
INTRAMUSCULAR | Status: AC
  Filled 2019-05-10: qty 2

## 2019-05-10 MED ORDER — DEXAMETHASONE SODIUM PHOSPHATE 10 MG/ML IJ SOLN
INTRAMUSCULAR | Status: DC | PRN
  Administered 2019-05-10: 8 mg via INTRAVENOUS

## 2019-05-10 MED ORDER — OXYCODONE HCL 5 MG PO TABS: 5.0000 mg | ORAL_TABLET | Freq: Once | ORAL | Status: DC | PRN

## 2019-05-10 MED ORDER — ONDANSETRON HCL 4 MG/2ML IJ SOLN: 4.0000 mg | Freq: Four times a day (QID) | INTRAMUSCULAR | Status: DC | PRN

## 2019-05-10 MED ORDER — INSULIN ASPART 100 UNIT/ML ~~LOC~~ SOLN
6.0000 [IU] | Freq: Once | SUBCUTANEOUS | Status: AC
  Administered 2019-05-10: 6 [IU] via SUBCUTANEOUS
  Filled 2019-05-10: qty 1

## 2019-05-10 MED ORDER — PROPOFOL 10 MG/ML IV BOLUS
INTRAVENOUS | Status: DC | PRN
  Administered 2019-05-10 (×2): 20 mg via INTRAVENOUS
  Administered 2019-05-10: 10 mg via INTRAVENOUS

## 2019-05-10 MED ORDER — TRANEXAMIC ACID-NACL 1000-0.7 MG/100ML-% IV SOLN
1000.0000 mg | INTRAVENOUS | Status: AC
  Administered 2019-05-10: 08:00:00 1000 mg via INTRAVENOUS
  Filled 2019-05-10: qty 100

## 2019-05-10 MED ORDER — ASPIRIN 81 MG PO CHEW
81.0000 mg | CHEWABLE_TABLET | Freq: Two times a day (BID) | ORAL | Status: DC
  Administered 2019-05-10 – 2019-05-11 (×2): 81 mg via ORAL
  Filled 2019-05-10 (×2): qty 1

## 2019-05-10 MED ORDER — FENTANYL CITRATE (PF) 100 MCG/2ML IJ SOLN: 25.0000 ug | INTRAMUSCULAR | Status: DC | PRN

## 2019-05-10 MED ORDER — DEXAMETHASONE SODIUM PHOSPHATE 10 MG/ML IJ SOLN
INTRAMUSCULAR | Status: AC
  Filled 2019-05-10: qty 1

## 2019-05-10 MED ORDER — PROPOFOL 10 MG/ML IV BOLUS
INTRAVENOUS | Status: AC
  Filled 2019-05-10: qty 80

## 2019-05-10 MED ORDER — OXYCODONE HCL 5 MG/5ML PO SOLN: 5.0000 mg | Freq: Once | ORAL | Status: DC | PRN

## 2019-05-10 MED ORDER — PHENYLEPHRINE HCL (PRESSORS) 10 MG/ML IV SOLN
INTRAVENOUS | Status: AC
  Filled 2019-05-10: qty 2

## 2019-05-10 MED ORDER — OXYCODONE HCL 5 MG PO TABS
10.0000 mg | ORAL_TABLET | ORAL | Status: DC | PRN
  Administered 2019-05-11 (×2): 10 mg via ORAL
  Filled 2019-05-10 (×2): qty 2

## 2019-05-10 MED ORDER — POLYETHYLENE GLYCOL 3350 17 G PO PACK: 17.0000 g | PACK | Freq: Every day | ORAL | Status: DC | PRN

## 2019-05-10 MED ORDER — ONDANSETRON HCL 4 MG/2ML IJ SOLN: 4.0000 mg | Freq: Once | INTRAMUSCULAR | Status: DC | PRN

## 2019-05-10 MED ORDER — IRBESARTAN 150 MG PO TABS
150.0000 mg | ORAL_TABLET | Freq: Every day | ORAL | Status: DC
  Administered 2019-05-10 – 2019-05-11 (×2): 150 mg via ORAL
  Filled 2019-05-10 (×2): qty 1

## 2019-05-10 SURGICAL SUPPLY — 38 items
ACETAB CUP W GRIPTION 54MM (Plate) ×1 IMPLANT
ACETAB CUP W/GRIPTION 54 (Plate) ×2 IMPLANT
APL PRP STRL LF DISP 70% ISPRP (MISCELLANEOUS) ×1
BLADE SAG 18X100X1.27 (BLADE) ×2 IMPLANT
CHLORAPREP W/TINT 26 (MISCELLANEOUS) ×3 IMPLANT
COVER PERINEAL POST (MISCELLANEOUS) ×3 IMPLANT
COVER SURGICAL LIGHT HANDLE (MISCELLANEOUS) ×3 IMPLANT
COVER WAND RF STERILE (DRAPES) IMPLANT
CUP ACETAB W/GRIPTION 54 (Plate) IMPLANT
DRAPE STERI IOBAN 125X83 (DRAPES) ×3 IMPLANT
DRAPE U-SHAPE 47X51 STRL (DRAPES) ×6 IMPLANT
DRSG AQUACEL AG ADV 3.5X10 (GAUZE/BANDAGES/DRESSINGS) ×3 IMPLANT
ELECT BLADE TIP CTD 4 INCH (ELECTRODE) ×3 IMPLANT
ELECT REM PT RETURN 15FT ADLT (MISCELLANEOUS) ×3 IMPLANT
GAUZE XEROFORM 1X8 LF (GAUZE/BANDAGES/DRESSINGS) ×2 IMPLANT
GLOVE BIO SURGEON STRL SZ7.5 (GLOVE) ×3 IMPLANT
GLOVE BIOGEL PI IND STRL 8 (GLOVE) ×2 IMPLANT
GLOVE BIOGEL PI INDICATOR 8 (GLOVE) ×4
GLOVE ECLIPSE 8.0 STRL XLNG CF (GLOVE) ×3 IMPLANT
GOWN STRL REUS W/TWL XL LVL3 (GOWN DISPOSABLE) ×6 IMPLANT
HANDPIECE INTERPULSE COAX TIP (DISPOSABLE) ×3
HEAD CERAMIC 36 PLUS5 (Hips) ×2 IMPLANT
HOLDER FOLEY CATH W/STRAP (MISCELLANEOUS) ×3 IMPLANT
KIT TURNOVER KIT A (KITS) IMPLANT
LINER NEUTRAL 54X36MM PLUS 4 (Hips) ×2 IMPLANT
PACK ANTERIOR HIP CUSTOM (KITS) ×3 IMPLANT
SET HNDPC FAN SPRY TIP SCT (DISPOSABLE) ×1 IMPLANT
STAPLER VISISTAT 35W (STAPLE) ×2 IMPLANT
STEM FEMORAL SZ6 HIGH ACTIS (Stem) ×2 IMPLANT
SUT ETHIBOND NAB CT1 #1 30IN (SUTURE) ×3 IMPLANT
SUT MNCRL AB 4-0 PS2 18 (SUTURE) ×2 IMPLANT
SUT VIC AB 0 CT1 36 (SUTURE) ×3 IMPLANT
SUT VIC AB 1 CT1 36 (SUTURE) ×3 IMPLANT
SUT VIC AB 2-0 CT1 27 (SUTURE) ×6
SUT VIC AB 2-0 CT1 TAPERPNT 27 (SUTURE) ×2 IMPLANT
TRAY FOLEY MTR SLVR 16FR STAT (SET/KITS/TRAYS/PACK) ×3 IMPLANT
YANKAUER SUCT BULB TIP 10FT TU (MISCELLANEOUS) ×3 IMPLANT
saw blade ×2 IMPLANT

## 2019-05-10 NOTE — Anesthesia Postprocedure Evaluation (Signed)
Anesthesia Post Note  Patient: [name] [name]  Procedure(s) Performed: RIGHT TOTAL HIP ARTHROPLASTY ANTERIOR APPROACH (Right Hip)     Patient location during evaluation: PACU Anesthesia Type: Spinal Level of consciousness: oriented and awake and alert Pain management: pain level controlled Vital Signs Assessment: post-procedure vital signs reviewed and stable Respiratory status: spontaneous breathing, respiratory function stable and nonlabored ventilation Cardiovascular status: blood pressure returned to baseline and stable Postop Assessment: no headache, no backache, no apparent nausea or vomiting and spinal receding Anesthetic complications: no    Last Vitals:  Vitals:   05/10/19 1030 05/10/19 1050  BP: 118/87 126/82  Pulse: 71 69  Resp: 11 14  Temp: (!) 36.3 C 36.5 C  SpO2: 100% 100%    Last Pain:  Vitals:   05/10/19 1030  TempSrc:   PainSc: 0-No pain                 Lidia Collum

## 2019-05-10 NOTE — Evaluation (Signed)
Physical Therapy Evaluation Patient Details Name: [name] [name] MRN: [id number] DOB: [birth year] Today's Date: 05/10/2019   History of Present Illness  s/p R  DA THA   Clinical Impression  Pt is s/p THA resulting in the deficits listed below (see PT Problem List).  Pt should continue to progress well, amb 57' with RW and min/guard today (POD zero). Pt planning for possible d/c tomorrow. Will follow  Pt will benefit from skilled PT to increase their independence and safety with mobility to allow discharge to the venue listed below.      Follow Up Recommendations Follow surgeon's recommendation for DC plan and follow-up therapies    Equipment Recommendations  Rolling walker with 5" wheels;3in1 (PT)    Recommendations for Other Services       Precautions / Restrictions Precautions Precautions: Fall Restrictions Weight Bearing Restrictions: No Other Position/Activity Restrictions: WBAT       Mobility  Bed Mobility Overal bed mobility: Needs Assistance Bed Mobility: Supine to Sit     Supine to sit: Min assist     General bed mobility comments: assist with RLE and trunk to upright   Transfers Overall transfer level: Needs assistance Equipment used: Rolling walker (2 wheeled) Transfers: Sit to/from Stand Sit to Stand: Min assist         General transfer comment: cues for hand placement and  RLE position  Ambulation/Gait Ambulation/Gait assistance: Min assist Gait Distance (Feet): 60 Feet Assistive device: Rolling walker (2 wheeled) Gait Pattern/deviations: Step-to pattern     General Gait Details: cues for sequence and RW position  Stairs            Wheelchair Mobility    Modified Rankin (Stroke Patients Only)       Balance                                             Pertinent Vitals/Pain Pain Assessment: 0-10 Pain Score: 4  Pain Location: right hip Pain Descriptors / Indicators: Aching;Discomfort Pain Intervention(s):  Limited activity within patient's tolerance;Monitored during session;Premedicated before session;Repositioned;Ice applied    Home Living Family/patient expects to be discharged to:: Private residence Living Arrangements: Spouse/significant other Available Help at Discharge: Family Type of Home: House Home Access: Stairs to enter   Technical brewer of Steps: 1 to 2 Home Layout: One level Home Equipment: None      Prior Function Level of Independence: Independent               Hand Dominance        Extremity/Trunk Assessment   Upper Extremity Assessment Upper Extremity Assessment: Overall WFL for tasks assessed    Lower Extremity Assessment Lower Extremity Assessment: RLE deficits/detail RLE Deficits / Details: ankle WFL, knee and hip grossly 2+/5, limited by post op pain and weakness       Communication   Communication: No difficulties  Cognition Arousal/Alertness: Awake/alert Behavior During Therapy: WFL for tasks assessed/performed Overall Cognitive Status: Within Functional Limits for tasks assessed                                        General Comments      Exercises Total Joint Exercises Ankle Circles/Pumps: AROM;Both;10 reps   Assessment/Plan    PT Assessment Patient needs continued PT services  PT Problem List Decreased strength;Decreased mobility;Decreased activity tolerance;Decreased knowledge of use of DME;Pain       PT Treatment Interventions Gait training;DME instruction;Stair training;Therapeutic exercise;Therapeutic activities;Functional mobility training;Patient/family education    PT Goals (Current goals can be found in the Care Plan section)  Acute Rehab PT Goals 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, cutting cattle PT Goal Formulation: With patient Time For Goal Achievement: 05/17/19 Potential to Achieve Goals: Good    Frequency 7X/week   Barriers to discharge        Co-evaluation                AM-PAC PT "6 Clicks" Mobility  Outcome Measure Help needed turning from your back to your side while in a flat bed without using bedrails?: A Little Help needed moving from lying on your back to sitting on the side of a flat bed without using bedrails?: A Little Help needed moving to and from a bed to a chair (including a wheelchair)?: A Little Help needed standing up from a chair using your arms (e.g., wheelchair or bedside chair)?: A Little Help needed to walk in hospital room?: A Little Help needed climbing 3-5 steps with a railing? : A Lot 6 Click Score: 17    End of Session Equipment Utilized During Treatment: Gait belt Activity Tolerance: Patient tolerated treatment well Patient left: in chair;with call bell/phone within reach;with chair alarm set   PT Visit Diagnosis: Difficulty in walking, not elsewhere classified (R26.2)    Time: 8264-1583 PT Time Calculation (min) (ACUTE ONLY): 25 min   Charges:   PT Evaluation $PT Eval Low Complexity: 1 Low PT Treatments $Gait Training: 8-22 mins        Kenyon Ana, PT  Pager: [phone] Acute Rehab Dept [facility]): [phone]   05/10/2019   [facility] 05/10/2019, 5:01 PM

## 2019-05-10 NOTE — H&P (Signed)
TOTAL HIP ADMISSION H&P  Patient is admitted for right total hip arthroplasty.  Subjective:  Chief Complaint: right hip pain  HPI: [name] [name], 63 y.o. male, has a history of pain and functional disability in the right hip(s) due to arthritis and patient has failed non-surgical conservative treatments for greater than 12 weeks to include NSAID's and/or analgesics, corticosteriod injections, flexibility and strengthening excercises, weight reduction as appropriate and activity modification.  Onset of symptoms was gradual starting 3 years ago with gradually worsening course since that time.The patient noted no past surgery on the right hip(s).  Patient currently rates pain in the right hip at 10 out of 10 with activity. Patient has night pain, worsening of pain with activity and weight bearing, pain that interfers with activities of daily living and pain with passive range of motion. Patient has evidence of subchondral cysts, subchondral sclerosis, periarticular osteophytes and joint space narrowing by imaging studies. This condition presents safety issues increasing the risk of falls.  There is no current active infection.  Patient Active Problem List   Diagnosis Date Noted  . Unilateral primary osteoarthritis, right hip 01/24/2019  . GERD (gastroesophageal reflux disease) 09/16/2013  . HTN (hypertension) 06/07/2011  . Irritable bowel 06/07/2011  . DIVERTICULITIS-COLON 11/27/2008   Past Medical History:  Diagnosis Date  . Anxiety    mild  . Arthritis    Rt hip  . Cancer ([place]) 04/2019   Skin Rt hand. Dr. Ronnald Ramp  . Diabetes mellitus without complication ([place])   . GERD (gastroesophageal reflux disease)    Tums or omeprozole  . History of IBS   . History of kidney stones    30 years ago  . Hypertension 2009   Meds controlled    Past Surgical History:  Procedure Laterality Date  . COLONOSCOPY     has IBS  . renal stone      Current Facility-Administered Medications  Medication  Dose Route Frequency Provider Last Rate Last Dose  . ceFAZolin (ANCEF) IVPB 2g/100 mL premix  2 g Intravenous On Call to OR Pete Pelt, PA-C      . chlorhexidine (HIBICLENS) 4 % liquid 4 application  60 mL Topical Once Erskine Emery W, PA-C      . lactated ringers infusion   Intravenous Continuous Lidia Collum, MD 50 mL/hr at 05/10/19 [phone]    . povidone-iodine 10 % swab 2 application  2 application Topical Once Pete Pelt, PA-C      . tranexamic acid (CYKLOKAPRON) IVPB 1,000 mg  1,000 mg Intravenous To OR Pete Pelt, PA-C       Allergies  Allergen Reactions  . Iohexol      Code: RASH, Desc: PER MARY SILER ([place] [facility])STATES PT HAD REACTION TO CONTRAST MANY YRS PRIOR. 01/05/09/RM, Onset Date: [id number]     Social History   Tobacco Use  . Smoking status: Never Smoker  . Smokeless tobacco: Never Used  Substance Use Topics  . Alcohol use: Yes    Alcohol/week: 2.0 - 3.0 standard drinks    Types: 2 - 3 Shots of liquor per week    Family History  Problem Relation Age of Onset  . Cancer Father      Review of Systems  Musculoskeletal: Positive for joint pain.  All other systems reviewed and are negative.   Objective:  Physical Exam  Constitutional: He is oriented to person, place, and time. He appears well-developed and well-nourished.  HENT:  Head: Normocephalic and atraumatic.  Eyes: Pupils are equal, round, and reactive to light. EOM are normal.  Neck: Normal range of motion. Neck supple.  Cardiovascular: Normal rate.  Respiratory: Effort normal.  GI: Soft.  Musculoskeletal:     Right hip: He exhibits decreased range of motion, decreased strength, tenderness and bony tenderness.  Neurological: He is alert and oriented to person, place, and time.  Skin: Skin is warm and dry.  Psychiatric: He has a normal mood and affect.    Vital signs in last 24 hours: Temp:  [98.7 F (37.1 C)] 98.7 F (37.1 C) (05/15 0642) Pulse Rate:  [89] 89 (05/15  0642) Resp:  [20] 20 (05/15 0642) BP: (178)/(91) 178/91 (05/15 0642) SpO2:  [97 %] 97 % (05/15 0642)  Labs:   Estimated body mass index is 31.06 kg/m as calculated from the following:   Height as of 05/07/19: 6' (1.829 m).   Weight as of 05/07/19: 103.9 kg.   Imaging Review Plain radiographs demonstrate severe degenerative joint disease of the right hip(s). The bone quality appears to be good for age and reported activity level.      Assessment/Plan:  End stage arthritis, right hip(s)  The patient history, physical examination, clinical judgement of the provider and imaging studies are consistent with end stage degenerative joint disease of the right hip(s) and total hip arthroplasty is deemed medically necessary. The treatment options including medical management, injection therapy, arthroscopy and arthroplasty were discussed at length. The risks and benefits of total hip arthroplasty were presented and reviewed. The risks due to aseptic loosening, infection, stiffness, dislocation/subluxation,  thromboembolic complications and other imponderables were discussed.  The patient acknowledged the explanation, agreed to proceed with the plan and consent was signed. Patient is being admitted for inpatient treatment for surgery, pain control, PT, OT, prophylactic antibiotics, VTE prophylaxis, progressive ambulation and ADL's and discharge planning.The patient is planning to be discharged home with home health services    Patient's anticipated LOS is less than 2 midnights, meeting these requirements: - Younger than 79 - Lives within 1 hour of care - Has a competent adult at home to recover with post-op recover - NO history of  - Chronic pain requiring opiods  - Diabetes  - Coronary Artery Disease  - Heart failure  - Heart attack  - Stroke  - DVT/VTE  - Cardiac arrhythmia  - Respiratory Failure/COPD  - Renal failure  - Anemia  - Advanced Liver disease

## 2019-05-10 NOTE — Transfer of Care (Signed)
Immediate Anesthesia Transfer of Care Note  Patient: [name] [name]  Procedure(s) Performed: RIGHT TOTAL HIP ARTHROPLASTY ANTERIOR APPROACH (Right Hip)  Patient Location: PACU  Anesthesia Type:Spinal  Level of Consciousness: awake, alert  and oriented  Airway & Oxygen Therapy: Patient Spontanous Breathing and Patient connected to face mask oxygen  Post-op Assessment: Report given to RN and Post -op Vital signs reviewed and stable  Post vital signs: Reviewed and stable  Last Vitals:  Vitals Value Taken Time  BP 138/79 05/10/2019  9:02 AM  Temp    Pulse 49 05/10/2019  9:03 AM  Resp 15 05/10/2019  9:03 AM  SpO2 99 % 05/10/2019  9:03 AM  Vitals shown include unvalidated device data.  Last Pain:  Vitals:   05/10/19 0642  TempSrc: Oral  PainSc:          Complications: No apparent anesthesia complications

## 2019-05-10 NOTE — Op Note (Signed)
NAME: AYUB, KIRSH MEDICAL RECORD SA:[id number] ACCOUNT [id number] DATE OF BIRTH:January 08, 1956 FACILITY: [facility] LOCATION: [facility] PHYSICIAN:Cyera Balboni Kerry Fort, MD  OPERATIVE REPORT  DATE OF PROCEDURE:  05/10/2019  PREOPERATIVE DIAGNOSIS:  Primary osteoarthritis and degenerative joint disease, right hip.  POSTOPERATIVE DIAGNOSIS:  Primary osteoarthritis and degenerative joint disease, right hip.  PROCEDURE:  Right total hip arthroplasty through direct anterior approach.  IMPLANTS:  DePuy Sector Gription acetabular component size 54, size 36+4 polyethylene liner, size 6 high-offset Actis femoral component, size 36+5 ceramic hip ball.  SURGEON:  Lind Guest. Ninfa Linden, MD  ASSISTANT:  Erskine Emery, PA-C  ANESTHESIA:  Spinal.  ANTIBIOTICS:  Two grams IV Ancef.  ESTIMATED BLOOD LOSS:  400 mL.  COMPLICATIONS:  None.  INDICATIONS:  The patient is a 63 year old gentleman well known to me.  He has been dealing with debilitating arthritis in his right hip for many years now.  He has gotten to where it is detrimentally affecting his mobility, his quality of life, and his  activities of daily living to the point he does wish to proceed with a total hip arthroplasty.  His x-rays do show superior lateral loss of the joint spaces, complete loss of joint space with a cam effect on the femoral head and neck area.  There are  periarticular osteophytes as well.  His hip is very stiff on exam, and his leg length shows that he is actually shorter now on that right side compared to his left side.  At this point with very conservative treatment measures that have been tried, he  does wish to proceed with a total hip arthroplasty.  We talked at length in detail about the risk of acute blood loss anemia, nerve or vessel injury, fracture, infection, DVT, dislocation and implant failure.  We have also talked about soft tissue  issues.  We talked about our goals being decreased pain, improved mobility,  and overall improved quality of life.  He is a newly diagnosed diabetic.  He just was started on glipizide yesterday, and his blood glucose is running a little bit high, but his  hemoglobin A1c was 7.3.  I have counseled him about that as well.  He is not an obese individual at all.  DESCRIPTION OF PROCEDURE:  After informed consent was obtained and appropriate right hip was marked, he was brought to the operating room and sat up on a stretcher where spinal anesthesia was then obtained.  He was then laid in the supine position on a  stretcher.  A Foley catheter was placed, and both feet had traction boots applied to them.  Next, he was placed supine on the Hana fracture table with a perineal post in place and both legs in in-line skeletal traction device with no traction applied.   Of note, he is definitely short on his leg lengths preoperatively.  Although his standing films and his intraoperative films show that that side is longer, he is actually shorter on clinical exam.  His right operative hip was prepped and draped with  ChloraPrep and sterile drapes.  A time-out was called, and he was identified as correct patient, correct right hip.  We then made an incision just inferior and posterior to the anterior superior iliac spine and carried this obliquely down the leg.  I  dissected down the tensor fascia lata muscle.  The tensor fascia was then divided longitudinally to proceed with direct anterior approach to the hip.  We identified and cauterized circumflex vessels and identified the hip capsule,  opened the hip capsule  in an L-type format, finding a moderate joint effusion and significant arthritis around the femoral head and neck.  We placed Cobra retractors around the medial and lateral femoral neck and then made our femoral neck cut with an oscillating saw proximal  to the lesser trochanter and completed this with an osteotome.  We placed a corkscrew guide in the femoral head and removed the  femoral head in its entirety and found a wide area of hardened and devoid of cartilage and shiny like marble.  We then cleaned  the acetabulum remnants of the acetabular labrum and other debris.  I placed a bent Hohmann over the medial acetabular rim.  We then began reaming under direct visualization from a size 43 reamer in stepwise increments, going all the way to a size 53  with all reamers placed under direct visualization.  The last reamer was placed under direct fluoroscopy as well, so we could obtain our depth of reaming, our inclination and anteversion.  I was then pleased with the positioning of that, so we placed the  real DePuy Sector Gription acetabular component size 54, which had a good fit to this.  We placed a 36+4 neutral polyethylene liner based on his high offset.  We then turned attention to the femur.  With the leg externally rotated to 120 degrees,  extended and adducted, we placed a Mueller retractor medially and a Hohmann retractor behind the greater trochanter.  We released the lateral joint capsule and used a box-cutting osteotome to enter the femoral canal and a rongeur to lateralize it.  We  then began broaching using the Actis broaching system from DePuy from a size 0, going up to a size 6.  With the size 6 in place, we trialed a standard offset femoral neck and a 36+1.5 hip ball and reduced this in the acetabulum.  We felt like we needed  just a little bit more offset and leg length, but it did feel stable on exam.  We were pleased with the positioning radiographically as well.  We dislocated the hip and removed the trial components.  We then placed the real high offset femoral component,  size 6, and the real 36+5 ceramic hip ball, reduced this in the acetabulum, and I was pleased with the leg length, offset, range of motion and stability assessed clinically and under fluoroscopic assessment.  We then irrigated the soft tissue with  normal saline solution using pulsatile  lavage.  We closed the joint capsule with interrupted #1 Ethibond suture, followed by running #1 Vicryl to close the tensor fascia, 0 Vicryl was used to close the deep tissue, 2-0 Vicryl was used to close the  subcutaneous tissue, interrupted staples were placed on the skin.  Xeroform and an Aquacel dressing were applied.  He was taken off of the Hana table and taken to recovery room in stable condition.  All final counts were correct.  There were no  complications noted.  Of note, Benita Stabile, PA-C, did assist during the entire case.  Assistance was crucial for facilitating all aspects of this case.  LN/NUANCE  D:05/10/2019 T:05/10/2019 JOB:[id number]

## 2019-05-10 NOTE — Progress Notes (Signed)
Dr Kerin Perna and Dr Kathrynn Speed aware of pts am cbg reading.

## 2019-05-10 NOTE — Anesthesia Procedure Notes (Signed)
Spinal  Start time: 05/10/2019 7:27 AM End time: 05/10/2019 7:30 AM Staffing Resident/CRNA: Niel Hummer, CRNA Performed: resident/CRNA  Preanesthetic Checklist Completed: patient identified, surgical consent, pre-op evaluation, IV checked, risks and benefits discussed and monitors and equipment checked Spinal Block Patient position: sitting Prep: DuraPrep Patient monitoring: heart rate, blood pressure and continuous pulse ox Approach: midline Location: L3-4 Injection technique: single-shot Needle Needle type: Pencan  Needle gauge: 24 G

## 2019-05-10 NOTE — Brief Op Note (Signed)
05/10/2019  8:45 AM  PATIENT:  [name] [name]  63 y.o. male  PRE-OPERATIVE DIAGNOSIS:  osteoarthritis right hip  POST-OPERATIVE DIAGNOSIS:  osteoarthritis right hip  PROCEDURE:  Procedure(s): RIGHT TOTAL HIP ARTHROPLASTY ANTERIOR APPROACH (Right)  SURGEON:  Surgeon(s) and Role:    Mcarthur Rossetti, MD - Primary  PHYSICIAN ASSISTANT: Benita Stabile, PA-C  ANESTHESIA:   spinal  EBL:  400 mL   COUNTS:  YES  DICTATION: .Other Dictation: Dictation Number [id number]  PLAN OF CARE: Admit for overnight observation  PATIENT DISPOSITION:  PACU - hemodynamically stable.   Delay start of Pharmacological VTE agent (>24hrs) due to surgical blood loss or risk of bleeding: no

## 2019-05-10 NOTE — Anesthesia Procedure Notes (Signed)
Procedure Name: MAC Date/Time: 05/10/2019 7:26 AM Performed by: Niel Hummer, CRNA Pre-anesthesia Checklist: Patient identified, Emergency Drugs available, Suction available and Patient being monitored Patient Re-evaluated:Patient Re-evaluated prior to induction Oxygen Delivery Method: Simple face mask

## 2019-05-10 NOTE — Patient Instructions (Signed)
Start Glucotrol XL 2.5 mg daily.  Change losartan to Benicar 20 mg daily.  Continue HCTZ.  Follow-up in 4 to 6 weeks with blood pressure check, hemoglobin A1c, fasting lipid panel and weight check.  Monitor blood pressure at home.  Check Accu-Cheks at least before breakfast and before supper at home while recuperating.  Call if Accu-Cheks are over 180.

## 2019-05-11 LAB — CBC
HCT: 37.2 % — ABNORMAL LOW (ref 39.0–52.0)
Hemoglobin: 12.8 g/dL — ABNORMAL LOW (ref 13.0–17.0)
MCH: 33.2 pg (ref 26.0–34.0)
MCHC: 34.4 g/dL (ref 30.0–36.0)
MCV: 96.6 fL (ref 80.0–100.0)
Platelets: 156 10*3/uL (ref 150–400)
RBC: 3.85 MIL/uL — ABNORMAL LOW (ref 4.22–5.81)
RDW: 12.7 % (ref 11.5–15.5)
WBC: 11 10*3/uL — ABNORMAL HIGH (ref 4.0–10.5)
nRBC: 0 % (ref 0.0–0.2)

## 2019-05-11 LAB — BASIC METABOLIC PANEL
Anion gap: 8 (ref 5–15)
BUN: 15 mg/dL (ref 8–23)
CO2: 27 mmol/L (ref 22–32)
Calcium: 8.9 mg/dL (ref 8.9–10.3)
Chloride: 102 mmol/L (ref 98–111)
Creatinine, Ser: 0.94 mg/dL (ref 0.61–1.24)
GFR calc Af Amer: >60 mL/min (ref >60)
GFR calc non Af Amer: >60 mL/min (ref >60)
Glucose, Bld: 235 mg/dL — ABNORMAL HIGH (ref 70–99)
Potassium: 4.5 mmol/L (ref 3.5–5.1)
Sodium: 137 mmol/L (ref 135–145)

## 2019-05-11 MED ORDER — OXYCODONE HCL 5 MG PO TABS: 5.0000 mg | ORAL_TABLET | Freq: Four times a day (QID) | ORAL | 0 refills | Status: DC | PRN

## 2019-05-11 MED ORDER — ASPIRIN 81 MG PO CHEW: 81.0000 mg | CHEWABLE_TABLET | Freq: Two times a day (BID) | ORAL | 0 refills | Status: DC

## 2019-05-11 MED ORDER — METHOCARBAMOL 500 MG PO TABS: 500.0000 mg | ORAL_TABLET | Freq: Four times a day (QID) | ORAL | 1 refills | Status: DC | PRN

## 2019-05-11 NOTE — TOC Initial Note (Signed)
Transition of Care [facility]) - Initial/Assessment Note    Patient Details  Name: [name] [name] MRN: [id number] Date of Birth: [birth year]  Transition of [facility]) CM/SW Contact:    [name] Rasher, RN Phone Number: 05/11/2019, 12:07 PM  Clinical Narrative:                 Spoke to pt and offered choice for [facility]. Pt agreeable to [facility] for [name]. Requested cane, RW and 3n1 bedside commode. States his girlfriend will assist him at home as needed. [place] for DME to be delivered to room prior to dc.   Expected Discharge Plan: [place] Barriers to Discharge: No Barriers Identified   Patient Goals and CMS Choice Patient states their goals for this hospitalization and ongoing recovery are:: get back to normal activity CMS Medicare.gov Compare Post Acute Care list provided to:: Patient Choice offered to / list presented to : Patient  Expected Discharge Plan and Services Expected Discharge Plan: [place]   Discharge Planning Services: CM Consult Post Acute Care Choice: Home Health, Durable Medical Equipment Living arrangements for the past 2 months: Single Family Home Expected Discharge Date: 05/11/19                 DME Agency: [employer] Date DME Agency Contacted: 05/11/19 Time DME Agency Contacted: 1000 Representative spoke with at DME Agency: [place]: PT [place] Agency: [facility] [employer]) Date [place]: 05/11/19 Time [place]: 1000 Representative spoke with at [place]: Graciella Freer  Prior Living Arrangements/Services Living arrangements for the past 2 months: [place] with:: Significant Other Patient language and need for interpreter reviewed:: Yes Do you feel safe going back to the place where you live?: Yes      Need for Family Participation in Patient Care: Yes (Comment) Care giver support system in place?: Yes (comment)   Criminal  Activity/Legal Involvement Pertinent to Current Situation/Hospitalization: No - Comment as needed  Activities of Daily Living Home Assistive Devices/Equipment: Walker (specify type), Eyeglasses ADL Screening (condition at time of admission) Patient's cognitive ability adequate to safely complete daily activities?: Yes Is the patient deaf or have difficulty hearing?: No Does the patient have difficulty seeing, even when wearing glasses/contacts?: No Does the patient have difficulty concentrating, remembering, or making decisions?: No Patient able to express need for assistance with ADLs?: Yes Does the patient have difficulty dressing or bathing?: No Independently performs ADLs?: Yes (appropriate for developmental age) Does the patient have difficulty walking or climbing stairs?: No Weakness of Legs: None Weakness of Arms/Hands: None  Permission Sought/Granted Permission sought to share information with : Case Manager, PCP, Family Supports, Other (comment) Permission granted to share information with : Yes, Verbal Permission Granted  Share Information with NAME: [name] [name]  Permission granted to share info w AGENCY: [facility], [place] granted to share info w Relationship: significant other  Permission granted to share info w Contact Information: [phone]  Emotional Assessment   Attitude/Demeanor/Rapport: Engaged Affect (typically observed): Accepting Orientation: : Oriented to Self, Oriented to Place, Oriented to  Time, Oriented to Situation   Psych Involvement: No (comment)  Admission diagnosis:  osteoarthritis right hip Patient Active Problem List   Diagnosis Date Noted  . Status post total replacement of right hip 05/10/2019  . Unilateral primary osteoarthritis, right hip 01/24/2019  . GERD (gastroesophageal reflux disease) 09/16/2013  . HTN (hypertension)  06/07/2011  . Irritable bowel 06/07/2011  . DIVERTICULITIS-COLON 11/27/2008   PCP:  [name] Showers, MD Pharmacy:   [id number] [place], [place] [address] [place] [address] Phone: [phone] Fax: [phone]     Social Determinants of Health (SDOH) Interventions    Readmission Risk Interventions No flowsheet data found.

## 2019-05-11 NOTE — Plan of Care (Signed)
Patient discharged home in stable condition. Discharge instructions given, patient verbalized understanding. Patient is waiting on his ride.

## 2019-05-11 NOTE — Progress Notes (Signed)
Subjective: 1 Day Post-Op Procedure(s) (LRB): RIGHT TOTAL HIP ARTHROPLASTY ANTERIOR APPROACH (Right) Patient reports pain as moderate.    Objective: Vital signs in last 24 hours: Temp:  [97.4 F (36.3 C)-98.3 F (36.8 C)] 98.3 F (36.8 C) (05/16 0506) Pulse Rate:  [66-81] 69 (05/16 0506) Resp:  [11-19] 16 (05/16 0506) BP: (110-152)/(74-94) 135/74 (05/16 0506) SpO2:  [98 %-100 %] 100 % (05/16 0506) Weight:  [103.9 kg] 103.9 kg (05/15 1050)  Intake/Output from previous day: 05/15 0701 - 05/16 0700 In: 2304.5 [P.O.:420; I.V.:1674.5; IV Piggyback:200] Out: 2625 [Urine:2225; Blood:400] Intake/Output this shift: No intake/output data recorded.  Recent Labs    05/11/19 0341  HGB 12.8*   Recent Labs    05/11/19 0341  WBC 11.0*  RBC 3.85*  HCT 37.2*  PLT 156   Recent Labs    05/11/19 0341  NA 137  K 4.5  CL 102  CO2 27  BUN 15  CREATININE 0.94  GLUCOSE 235*  CALCIUM 8.9   No results for input(s): LABPT, INR in the last 72 hours.  Sensation intact distally Intact pulses distally Dorsiflexion/Plantar flexion intact Incision: dressing C/D/I   Assessment/Plan: 1 Day Post-Op Procedure(s) (LRB): RIGHT TOTAL HIP ARTHROPLASTY ANTERIOR APPROACH (Right) Up with therapy Discharge home with home health today.      Mcarthur Rossetti 05/11/2019, 9:34 AM

## 2019-05-11 NOTE — Discharge Summary (Signed)
Patient ID: [name] [name] MRN: [id number] DOB/AGE: 1956-10-25 63 y.o.  Admit date: 05/10/2019 Discharge date: 05/11/2019  Admission Diagnoses:  Principal Problem:   Unilateral primary osteoarthritis, right hip Active Problems:   Status post total replacement of right hip   Discharge Diagnoses:  Same  Past Medical History:  Diagnosis Date  . Anxiety    mild  . Arthritis    Rt hip  . Cancer ([place]) 04/2019   Skin Rt hand. Dr. Ronnald Ramp  . Diabetes mellitus without complication ([place])   . GERD (gastroesophageal reflux disease)    Tums or omeprozole  . History of IBS   . History of kidney stones    30 years ago  . Hypertension 2009   Meds controlled    Surgeries: Procedure(s): RIGHT TOTAL HIP ARTHROPLASTY ANTERIOR APPROACH on 05/10/2019   Consultants:   Discharged Condition: Improved  Hospital Course: [name] [name] is an 63 y.o. male who was admitted 05/10/2019 for operative treatment ofUnilateral primary osteoarthritis, right hip. Patient has severe unremitting pain that affects sleep, daily activities, and work/hobbies. After pre-op clearance the patient was taken to the operating room on 05/10/2019 and underwent  Procedure(s): RIGHT TOTAL HIP ARTHROPLASTY ANTERIOR APPROACH.    Patient was given perioperative antibiotics:  Anti-infectives (From admission, onward)   Start     Dose/Rate Route Frequency Ordered Stop   05/10/19 1330  ceFAZolin (ANCEF) IVPB 1 g/50 mL premix     1 g 100 mL/hr over 30 Minutes Intravenous Every 6 hours 05/10/19 1053 05/10/19 2046   05/10/19 0600  ceFAZolin (ANCEF) IVPB 2g/100 mL premix     2 g 200 mL/hr over 30 Minutes Intravenous On call to O.R. 05/10/19 0548 05/10/19 0800       Patient was given sequential compression devices, early ambulation, and chemoprophylaxis to prevent DVT.  Patient benefited maximally from hospital stay and there were no complications.    Recent vital signs:  Patient Vitals for the past 24 hrs:  BP Temp Temp src  Pulse Resp SpO2 Height Weight  05/11/19 0506 135/74 98.3 F (36.8 C) Oral 69 16 100 % - -  05/11/19 0118 131/74 98.2 F (36.8 C) Oral 72 16 98 % - -  05/10/19 2105 (!) 143/78 98.3 F (36.8 C) Oral 75 16 98 % - -  05/10/19 1623 (!) 146/89 97.7 F (36.5 C) - 79 15 98 % - -  05/10/19 1357 (!) 152/94 97.6 F (36.4 C) Oral 81 16 99 % - -  05/10/19 1242 138/88 97.9 F (36.6 C) - 66 16 100 % - -  05/10/19 1154 114/74 98 F (36.7 C) - 68 16 99 % - -  05/10/19 1050 126/82 97.7 F (36.5 C) - 69 14 100 % - -  05/10/19 1050 - - Oral - - - 6' (1.829 m) 103.9 kg  05/10/19 1030 118/87 (!) 97.4 F (36.3 C) - 71 11 100 % - -  05/10/19 1015 112/80 - - 69 13 100 % - -  05/10/19 1000 112/84 - - 79 15 100 % - -  05/10/19 0945 110/77 - - 73 19 98 % - -     Recent laboratory studies:  Recent Labs    05/11/19 0341  WBC 11.0*  HGB 12.8*  HCT 37.2*  PLT 156  NA 137  K 4.5  CL 102  CO2 27  BUN 15  CREATININE 0.94  GLUCOSE 235*  CALCIUM 8.9     Discharge Medications:   Allergies as  of 05/11/2019      Reactions   Iohexol     Code: RASH, Desc: PER MARY SILER (TECH @ [facility])STATES PT HAD REACTION TO CONTRAST MANY YRS PRIOR. 01/05/09/RM, Onset Date: [id number]      Medication List    TAKE these medications   ALPRAZolam 0.5 MG tablet Commonly known as:  XANAX One po 3 times daily as needed What changed:    how much to take  how to take this  when to take this  additional instructions   aspirin 81 MG chewable tablet Chew 1 tablet (81 mg total) by mouth 2 (two) times daily.   glipiZIDE 2.5 MG 24 hr tablet Commonly known as:  Glucotrol XL Take 1 tablet (2.5 mg total) by mouth daily with breakfast.   hydrochlorothiazide 25 MG tablet Commonly known as:  HYDRODIURIL TAKE 1 TABLET BY MOUTH EVERY DAY **TAKE WITH LOSARTAN** What changed:  See the new instructions.   ibuprofen 200 MG tablet Commonly known as:  ADVIL Take 600 mg by mouth every 8 (eight) hours as needed (for pain).    loperamide 2 MG capsule Commonly known as:  IMODIUM Take 2-4 mg by mouth 4 (four) times daily as needed for diarrhea or loose stools.   methocarbamol 500 MG tablet Commonly known as:  ROBAXIN Take 1 tablet (500 mg total) by mouth every 6 (six) hours as needed for muscle spasms.   olmesartan 20 MG tablet Commonly known as:  Benicar Take 1 tablet (20 mg total) by mouth daily.   oxyCODONE 5 MG immediate release tablet Commonly known as:  Oxy IR/ROXICODONE Take 1-2 tablets (5-10 mg total) by mouth every 6 (six) hours as needed for moderate pain (pain score 4-6).   ranitidine 150 MG capsule Commonly known as:  ZANTAC Take 150 mg by mouth 2 (two) times daily as needed for heartburn (for spicy foods).            Durable Medical Equipment  (From admission, onward)         Start     Ordered   05/10/19 1054  DME Walker rolling  Once    Question:  Patient needs a walker to treat with the following condition  Answer:  Status post total replacement of right hip   05/10/19 1053   05/10/19 1054  DME 3 n 1  Once     05/10/19 1053          Diagnostic Studies: Dg Pelvis Portable  Result Date: 05/10/2019 CLINICAL DATA:  63 year old male undergoing right total hip replacement. EXAM: PORTABLE PELVIS 1-2 VIEWS COMPARISON:  Intraoperative images from 0745 hours today. Right hip series 01/24/2019. FINDINGS: Portable AP supine view at 0911 hours. Right side bipolar hip arthroplasty in place. Normal alignment in the AP plane. No unexpected osseous changes. Regional postoperative soft tissue gas. Stable left hip. IMPRESSION: Right hip arthroplasty with no adverse features. Electronically Signed   By: Genevie Ann M.D.   On: 05/10/2019 10:48   Dg C-arm 1-60 Min-no Report  Result Date: 05/10/2019 Fluoroscopy was utilized by the requesting physician.  No radiographic interpretation.   Dg Hip Operative Unilat With Pelvis Right  Result Date: 05/10/2019 CLINICAL DATA:  63 year old male undergoing  right total hip replacement. EXAM: OPERATIVE 7 HIP (WITH PELVIS IF PERFORMED) right VIEWS TECHNIQUE: Fluoroscopic spot image(s) were submitted for interpretation post-operatively. COMPARISON:  Right hip series 01/24/2019. FLUOROSCOPY TIME:  0 minutes 17 seconds FINDINGS: Multiple intraoperative fluoroscopic spot views of the right hip and lower  pelvis. Bipolar type hip arthroplasty is placed on the right. Normal alignment in the AP plane. IMPRESSION: Right hip arthroplasty with no adverse features. Electronically Signed   By: Genevie Ann M.D.   On: 05/10/2019 10:47    Disposition: Discharge disposition: 01-Home or Self Care         Follow-up Information    Mcarthur Rossetti, MD. Schedule an appointment as soon as possible for a visit in 2 week(s).   Specialty:  Orthopedic Surgery Contact information: [place] [address] [phone]            Signed: Mcarthur Rossetti 05/11/2019, 9:37 AM

## 2019-05-11 NOTE — Progress Notes (Signed)
     Home health agencies that serve [phone]. Your favorite home health agencies  [place] of Patient Care Rating Patient Survey Summary Rating  [place]  [phone] 3 out of 5 stars 4 out of [place]  [phone] 3 out of 5 stars 5 out of [place]  [phone] 4  out of 5 stars 3 out of [place]  [phone] 4  out of 5 stars 4 out of [place]  [phone] 4 out of 5 stars 4 out of [place]  [phone] 4 out of 5 stars 4 out of [place]  [phone] 3 out of 5 stars 3 out of 5 stars  [facility] [place]  [phone] 3  out of 5 stars 4 out of [place]  [phone] 3 out of 5 stars 4 out of 5 stars  [facility]  [phone] 3  out of 5 stars 3 out of [place]  [phone] 4 out of 5 stars 5 out of [place]  [phone] 3  out of 5 stars 4 out of [place]  [phone] 3 out of 5 stars 4 out of [place]  [phone] 4  out of 5 stars 3 out of [place]  [phone] 4  out of 5 stars 2 out of [place] number Footnote as displayed on [place]  1 This agency provides services under a federal waiver program to non-traditional, chronic long term population.  2 This agency provides services to a special needs population.  3 Not Available.  4 The number of patient episodes for this measure is too small to report.  5 This measure currently does not have data or provider has been certified/recertified for less than 6 months.  6 The national average for this measure is not provided  because of state-to-state differences in data collection.  7 Medicare is not displaying rates for this measure for any home health agency, because of an issue with the data.  8 There were problems with the data and they are being corrected.  9 Zero, or very few, patients met the survey's rules for inclusion. The scores shown, if any, reflect a very small number of surveys and may not accurately tell how an agency is doing.  10 Survey results are based on less than 12 months of data.  11 Fewer than 70 patients completed the survey. Use the scores shown, if any, with caution as the number of surveys may be too low to accurately tell how an agency is doing.  12 No survey results are available for this period.  13 Data suppressed by CMS for one or more quarters.

## 2019-05-11 NOTE — Progress Notes (Signed)
Physical Therapy Treatment Patient Details Name: [name] [name] MRN: [id number] DOB: 23-Dec-1956 Today's Date: 05/11/2019    History of Present Illness s/p R  DA THA     PT Comments    Pt progressing well, reviewed mobility progression, HEP, stairs and gait. Pt ready to d/c home from PT standpoint  Follow Up Recommendations  Follow surgeon's recommendation for DC plan and follow-up therapies     Equipment Recommendations  Rolling walker with 5" wheels;3in1 (PT)    Recommendations for Other Services       Precautions / Restrictions Precautions Precautions: Fall Restrictions Weight Bearing Restrictions: No Other Position/Activity Restrictions: WBAT     Mobility  Bed Mobility Overal bed mobility: Needs Assistance Bed Mobility: Supine to Sit;Sit to Supine     Supine to sit: Min assist Sit to supine: Min assist   General bed mobility comments: assist with RLE, instructed in use of gait belt as leg lifter  Transfers Overall transfer level: Needs assistance Equipment used: Rolling walker (2 wheeled) Transfers: Sit to/from Stand Sit to Stand: Supervision         General transfer comment: cues for hand placement and  RLE position  Ambulation/Gait Ambulation/Gait assistance: Min guard;Supervision Gait Distance (Feet): 100 Feet Assistive device: Rolling walker (2 wheeled) Gait Pattern/deviations: Step-to pattern     General Gait Details: cues for sequence and RW position   Stairs Stairs: Yes Stairs assistance: Min guard Stair Management: Forwards;With walker Number of Stairs: 1 General stair comments: cues for sequence   Wheelchair Mobility    Modified Rankin (Stroke Patients Only)       Balance                                            Cognition Arousal/Alertness: Awake/alert Behavior During Therapy: WFL for tasks assessed/performed Overall Cognitive Status: Within Functional Limits for tasks assessed                                         Exercises Total Joint Exercises Ankle Circles/Pumps: AROM;Both;10 reps Quad Sets: AROM;Both;10 reps Short Arc Quad: AROM;Right;10 reps Heel Slides: AAROM;Right;10 reps Hip ABduction/ADduction: AROM;AAROM;Right;10 reps Long Arc Quad: AROM;Strengthening;Right;10 reps    General Comments        Pertinent Vitals/Pain Pain Assessment: 0-10 Pain Score: 3  Pain Location: right hip Pain Descriptors / Indicators: Aching;Discomfort Pain Intervention(s): Limited activity within patient's tolerance;Monitored during session;Premedicated before session;Repositioned;Ice applied    Home Living                      Prior Function            PT Goals (current goals can now be found in the care plan section) Acute Rehab PT Goals 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, cutting cattle PT Goal Formulation: With patient Time For Goal Achievement: 05/17/19 Potential to Achieve Goals: Good Progress towards PT goals: Progressing toward goals    Frequency    7X/week      PT Plan Current plan remains appropriate    Co-evaluation              AM-PAC PT "6 Clicks" Mobility   Outcome Measure  Help needed turning from your back to your side while in a flat bed  without using bedrails?: A Little Help needed moving from lying on your back to sitting on the side of a flat bed without using bedrails?: A Little Help needed moving to and from a bed to a chair (including a wheelchair)?: A Little Help needed standing up from a chair using your arms (e.g., wheelchair or bedside chair)?: A Little Help needed to walk in hospital room?: A Little Help needed climbing 3-5 steps with a railing? : A Little 6 Click Score: 18    End of Session Equipment Utilized During Treatment: Gait belt Activity Tolerance: Patient tolerated treatment well Patient left: with call bell/phone within reach;in bed;with bed alarm set Nurse Communication: Mobility  status PT Visit Diagnosis: Difficulty in walking, not elsewhere classified (R26.2)     Time: 0940-1009 PT Time Calculation (min) (ACUTE ONLY): 29 min  Charges:  $Gait Training: 8-22 mins $Therapeutic Exercise: 8-22 mins                     Kenyon Ana, PT  Pager: [phone] Acute Rehab Dept [facility]): [phone]   05/11/2019    [facility] [place] [place] 05/11/2019, 10:18 AM

## 2019-05-11 NOTE — Discharge Instructions (Signed)
INSTRUCTIONS AFTER JOINT REPLACEMENT  ° °o Remove items at home which could result in a fall. This includes throw rugs or furniture in walking pathways °o ICE to the affected joint every three hours while awake for 30 minutes at a time, for at least the first 3-5 days, and then as needed for pain and swelling.  Continue to use ice for pain and swelling. You may notice swelling that will progress down to the foot and ankle.  This is normal after surgery.  Elevate your leg when you are not up walking on it.   °o Continue to use the breathing machine you got in the hospital (incentive spirometer) which will help keep your temperature down.  It is common for your temperature to cycle up and down following surgery, especially at night when you are not up moving around and exerting yourself.  The breathing machine keeps your lungs expanded and your temperature down. ° ° °DIET:  As you were doing prior to hospitalization, we recommend a well-balanced diet. ° °DRESSING / WOUND CARE / SHOWERING ° °Keep the surgical dressing until follow up.  The dressing is water proof, so you can shower without any extra covering.  IF THE DRESSING FALLS OFF or the wound gets wet inside, change the dressing with sterile gauze.  Please use good hand washing techniques before changing the dressing.  Do not use any lotions or creams on the incision until instructed by your surgeon.   ° °ACTIVITY ° °o Increase activity slowly as tolerated, but follow the weight bearing instructions below.   °o No driving for 6 weeks or until further direction given by your physician.  You cannot drive while taking narcotics.  °o No lifting or carrying greater than 10 lbs. until further directed by your surgeon. °o Avoid periods of inactivity such as sitting longer than an hour when not asleep. This helps prevent blood clots.  °o You may return to work once you are authorized by your doctor.  ° ° ° °WEIGHT BEARING  ° °Weight bearing as tolerated with assist  device (walker, cane, etc) as directed, use it as long as suggested by your surgeon or therapist, typically at least 4-6 weeks. ° ° °EXERCISES ° °Results after joint replacement surgery are often greatly improved when you follow the exercise, range of motion and muscle strengthening exercises prescribed by your doctor. Safety measures are also important to protect the joint from further injury. Any time any of these exercises cause you to have increased pain or swelling, decrease what you are doing until you are comfortable again and then slowly increase them. If you have problems or questions, call your caregiver or physical therapist for advice.  ° °Rehabilitation is important following a joint replacement. After just a few days of immobilization, the muscles of the leg can become weakened and shrink (atrophy).  These exercises are designed to build up the tone and strength of the thigh and leg muscles and to improve motion. Often times heat used for twenty to thirty minutes before working out will loosen up your tissues and help with improving the range of motion but do not use heat for the first two weeks following surgery (sometimes heat can increase post-operative swelling).  ° °These exercises can be done on a training (exercise) mat, on the floor, on a table or on a bed. Use whatever works the best and is most comfortable for you.    Use music or television while you are exercising so that   the exercises are a pleasant break in your day. This will make your life better with the exercises acting as a break in your routine that you can look forward to.   Perform all exercises about fifteen times, three times per day or as directed.  You should exercise both the operative leg and the other leg as well. ° °Exercises include: °  °• Quad Sets - Tighten up the muscle on the front of the thigh (Quad) and hold for 5-10 seconds.   °• Straight Leg Raises - With your knee straight (if you were given a brace, keep it on),  lift the leg to 60 degrees, hold for 3 seconds, and slowly lower the leg.  Perform this exercise against resistance later as your leg gets stronger.  °• Leg Slides: Lying on your back, slowly slide your foot toward your buttocks, bending your knee up off the floor (only go as far as is comfortable). Then slowly slide your foot back down until your leg is flat on the floor again.  °• Angel Wings: Lying on your back spread your legs to the side as far apart as you can without causing discomfort.  °• Hamstring Strength:  Lying on your back, push your heel against the floor with your leg straight by tightening up the muscles of your buttocks.  Repeat, but this time bend your knee to a comfortable angle, and push your heel against the floor.  You may put a pillow under the heel to make it more comfortable if necessary.  ° °A rehabilitation program following joint replacement surgery can speed recovery and prevent re-injury in the future due to weakened muscles. Contact your doctor or a physical therapist for more information on knee rehabilitation.  ° ° °CONSTIPATION ° °Constipation is defined medically as fewer than three stools per week and severe constipation as less than one stool per week.  Even if you have a regular bowel pattern at home, your normal regimen is likely to be disrupted due to multiple reasons following surgery.  Combination of anesthesia, postoperative narcotics, change in appetite and fluid intake all can affect your bowels.  ° °YOU MUST use at least one of the following options; they are listed in order of increasing strength to get the job done.  They are all available over the counter, and you may need to use some, POSSIBLY even all of these options:   ° °Drink plenty of fluids (prune juice may be helpful) and high fiber foods °Colace 100 mg by mouth twice a day  °Senokot for constipation as directed and as needed Dulcolax (bisacodyl), take with full glass of water  °Miralax (polyethylene glycol)  once or twice a day as needed. ° °If you have tried all these things and are unable to have a bowel movement in the first 3-4 days after surgery call either your surgeon or your primary doctor.   ° °If you experience loose stools or diarrhea, hold the medications until you stool forms back up.  If your symptoms do not get better within 1 week or if they get worse, check with your doctor.  If you experience "the worst abdominal pain ever" or develop nausea or vomiting, please contact the office immediately for further recommendations for treatment. ° ° °ITCHING:  If you experience itching with your medications, try taking only a single pain pill, or even half a pain pill at a time.  You can also use Benadryl over the counter for itching or also to   help with sleep.   TED HOSE STOCKINGS:  Use stockings on both legs until for at least 2 weeks or as directed by physician office. They may be removed at night for sleeping.  MEDICATIONS:  See your medication summary on the After Visit Summary that nursing will review with you.  You may have some home medications which will be placed on hold until you complete the course of blood thinner medication.  It is important for you to complete the blood thinner medication as prescribed.  PRECAUTIONS:  If you experience chest pain or shortness of breath - call 911 immediately for transfer to the hospital emergency department.   If you develop a fever greater that 101 F, purulent drainage from wound, increased redness or drainage from wound, foul odor from the wound/dressing, or calf pain - CONTACT YOUR SURGEON.                                                   FOLLOW-UP APPOINTMENTS:  If you do not already have a post-op appointment, please call the office for an appointment to be seen by your surgeon.  Guidelines for how soon to be seen are listed in your After Visit Summary, but are typically between 1-4 weeks after surgery.  OTHER INSTRUCTIONS:   Knee  Replacement:  Do not place pillow under knee, focus on keeping the knee straight while resting. CPM instructions: 0-90 degrees, 2 hours in the morning, 2 hours in the afternoon, and 2 hours in the evening. Place foam block, curve side up under heel at all times except when in CPM or when walking.  DO NOT modify, tear, cut, or change the foam block in any way.  MAKE SURE YOU:   Understand these instructions.   Get help right away if you are not doing well or get worse.    Thank you for letting us be a part of your medical care team.  It is a privilege we respect greatly.  We hope these instructions will help you stay on track for a fast and full recovery!    YOU CAN ACTUALLY CHANGE YOUR CURRENT DRESSING IN 6-7 DAYS AND THEN PLACE A NEW DRESSING JUST LIKE THAT ONE.  LEAVE THE NEXT DRESSING ON UNTIL YOUR FOLLOW-UP APPOINTMENT.

## 2019-05-13 ENCOUNTER — Encounter: Payer: 59 | Admitting: Internal Medicine

## 2019-05-13 ENCOUNTER — Telehealth: Payer: Self-pay | Admitting: Orthopaedic Surgery

## 2019-05-13 NOTE — Telephone Encounter (Signed)
I called Maria and advised. 

## 2019-05-13 NOTE — Telephone Encounter (Signed)
YES THAT IS FINE

## 2019-05-13 NOTE — Telephone Encounter (Signed)
I called [name] [name] and advised. 

## 2019-05-13 NOTE — Telephone Encounter (Signed)
[name] [name] physical therapist with [facility] called in said she needs verbal orders for home health pt for 3 times a week for 2 weeks.  [phone]

## 2019-05-13 NOTE — Telephone Encounter (Signed)
Received call from [facility] with [facility] needing verbal orders for HHPT 3 times a wk for 2 weeks. The number to contact Verdis Frederickson is [phone]

## 2019-05-13 NOTE — Telephone Encounter (Signed)
Ok for orders? 

## 2019-05-13 NOTE — Telephone Encounter (Signed)
Yes. Thanks.  He is post-op from a right total hip.

## 2019-05-13 NOTE — Telephone Encounter (Signed)
Ok for verbal orders ?

## 2019-05-14 ENCOUNTER — Encounter (HOSPITAL_COMMUNITY): Payer: Self-pay | Admitting: Orthopaedic Surgery

## 2019-05-14 LAB — GLUCOSE, CAPILLARY
Glucose-Capillary: 181 mg/dL — ABNORMAL HIGH (ref 70–99)
Glucose-Capillary: 212 mg/dL — ABNORMAL HIGH (ref 70–99)

## 2019-05-15 ENCOUNTER — Telehealth: Payer: Self-pay | Admitting: Orthopaedic Surgery

## 2019-05-15 MED ORDER — OXYCODONE HCL 5 MG PO TABS: 5.0000 mg | ORAL_TABLET | Freq: Four times a day (QID) | ORAL | 0 refills | Status: DC | PRN

## 2019-05-15 NOTE — Telephone Encounter (Signed)
Please advise 

## 2019-05-15 NOTE — Telephone Encounter (Signed)
Patient's wife Arrie Aran called advised patient need Rx refilled (Oxycontin) The number to contact patient is [phone]

## 2019-05-15 NOTE — Telephone Encounter (Signed)
Talked with patient's wife Dawn and advised her of message below, per Dr. Ninfa Linden.

## 2019-05-15 NOTE — Telephone Encounter (Signed)
I'll send some more in.  It has been only 5 days since surgery.  Needs to try to not go thru the meds so fast if possible.

## 2019-05-21 ENCOUNTER — Telehealth: Payer: Self-pay | Admitting: Internal Medicine

## 2019-05-21 ENCOUNTER — Encounter: Payer: Self-pay | Admitting: Internal Medicine

## 2019-05-21 NOTE — Telephone Encounter (Addendum)
[name] [name] [phone]  [name] called with his blood sugar readings and to also say that the blood pressure medicine is really helping.  Date    AM  PM 05/15/19  [phone] 05/16/19  188 181 05/17/19  169 147 05/18/19  176 162 05/19/19  164 151 05/20/19  171 163  05/21/19  142    [name] [name], please call pt and ask him to increase to Glipizide 5 mg daily and call us back next week with readings. Can call in new Rx

## 2019-05-23 ENCOUNTER — Other Ambulatory Visit: Payer: Self-pay

## 2019-05-23 ENCOUNTER — Ambulatory Visit (INDEPENDENT_AMBULATORY_CARE_PROVIDER_SITE_OTHER): Payer: 59 | Admitting: Orthopaedic Surgery

## 2019-05-23 ENCOUNTER — Encounter: Payer: Self-pay | Admitting: Orthopaedic Surgery

## 2019-05-23 DIAGNOSIS — Z96641 Presence of right artificial hip joint: Secondary | ICD-10-CM

## 2019-05-23 LAB — HM DIABETES EYE EXAM

## 2019-05-23 MED ORDER — GLIPIZIDE 5 MG PO TABS: 5.0000 mg | ORAL_TABLET | Freq: Every day | ORAL | 3 refills | Status: DC

## 2019-05-23 NOTE — Progress Notes (Signed)
HPI: [name] returns today first postop visit status post right total hip arthroplasty.  He has been taking a baby aspirin twice daily.  He has had no fevers chills shortness of breath chest pain.  He is weaning himself off of the oxycodone.  He is ambulating at this point time with a cane which he states sometimes he just leaves line around.  Feels that his range of motion and strength are improving.  Physical exam: Right hip surgical incisions healing well well's approximated with staples no signs of infection.  Right calf supple nontender.  Dorsiflexion plantarflexion ankle intact.   Impression: Status post right total hip arthroplasty 05/10/2019  Plan: He will work on scar tissue mobilization.  Staples were removed today Steri-Strips applied.  He will go to 81 mg aspirin once daily for the next week and then discontinue.  Follow-up with [place] in 1 month sooner if there is any questions or concerns.

## 2019-05-25 ENCOUNTER — Telehealth: Payer: Self-pay | Admitting: Internal Medicine

## 2019-05-25 ENCOUNTER — Other Ambulatory Visit: Payer: Self-pay | Admitting: Orthopaedic Surgery

## 2019-05-25 DIAGNOSIS — Z20822 Contact with and (suspected) exposure to covid-19: Secondary | ICD-10-CM

## 2019-05-25 NOTE — Telephone Encounter (Signed)
  Spoke with the patient about possible Covid-19 exposure at 05/15/19 office visit to [place]. RN explained the free Covid-19 screening offered for this exposure. Notified that she and her driver should wear a masks and the drive-up screening will take place at the old [facility] on [facility].  Patient wants to come for testing 05/26/19 @ 1200 at [facility] location

## 2019-05-25 NOTE — Telephone Encounter (Signed)
Ok to rf? 

## 2019-05-25 NOTE — Telephone Encounter (Signed)
Appointment scheduled for tomorrow, 05/26/19 at 1215.

## 2019-05-26 ENCOUNTER — Other Ambulatory Visit: Payer: 59

## 2019-05-27 LAB — NOVEL CORONAVIRUS, NAA: SARS-CoV-2, NAA: NOT DETECTED

## 2019-05-28 ENCOUNTER — Telehealth: Payer: Self-pay | Admitting: Internal Medicine

## 2019-05-28 NOTE — Telephone Encounter (Signed)
Much better. Set up OV here with Hemglobin AIC at same time- does not need to fast in 4 weeks

## 2019-05-28 NOTE — Telephone Encounter (Signed)
Pt called in and said his blood sugar readings 05/22/19 am 162 pm 160 05/23/19 am 161 pm 136 05/24/19 am 131 pm 102 05/25/19 am 131 pm 144 05/26/19 am 141 pm 134 05/27/19 am 134 pm 128 05/28/19 am 132

## 2019-05-30 ENCOUNTER — Ambulatory Visit: Payer: Self-pay | Admitting: *Deleted

## 2019-05-30 NOTE — Telephone Encounter (Signed)
Pt called in for his COVID-19 test results.  I let him know his results were Not detected.  It means he came up negative for the COVID-19. He thanked me.

## 2019-05-31 ENCOUNTER — Other Ambulatory Visit: Payer: Self-pay | Admitting: Internal Medicine

## 2019-05-31 NOTE — Telephone Encounter (Signed)
Please take care of these. We recently changed his dose of Glipizide so check my notes

## 2019-06-03 ENCOUNTER — Telehealth: Payer: Self-pay

## 2019-06-03 NOTE — Telephone Encounter (Signed)
[facility] faxed over refill request for Aspirin 81. Refill?

## 2019-06-03 NOTE — Telephone Encounter (Signed)
He can stop aspirin at this standpoint.

## 2019-06-04 NOTE — Telephone Encounter (Signed)
Called and left message with pharmacy that per Dr.Blackman he no longer needs the aspirin

## 2019-06-20 ENCOUNTER — Other Ambulatory Visit: Payer: Self-pay

## 2019-06-20 ENCOUNTER — Ambulatory Visit (INDEPENDENT_AMBULATORY_CARE_PROVIDER_SITE_OTHER): Payer: 59 | Admitting: Orthopaedic Surgery

## 2019-06-20 ENCOUNTER — Encounter: Payer: Self-pay | Admitting: Orthopaedic Surgery

## 2019-06-20 DIAGNOSIS — Z96641 Presence of right artificial hip joint: Secondary | ICD-10-CM

## 2019-06-20 NOTE — Progress Notes (Signed)
Patient is now 6 weeks status post a right total hip arthroplasty.  He says his blood glucose is getting under better control.  He has been having some low back pain and some hip stiffness but overall he is feeling better.  He has been riding horses well.  On exam he has excellent range of motion of his right hip.  There is some stiffness but is less.  His incisions healed nicely and there is no swelling.  His leg lengths are equal.  At this point we will not need to see him back for 3 months.  At that visit we will have a standing low AP pelvis and a lateral of his right operative hip.  If there is any issues before then he will let us know.

## 2019-06-23 ENCOUNTER — Other Ambulatory Visit: Payer: Self-pay | Admitting: Internal Medicine

## 2019-07-09 ENCOUNTER — Other Ambulatory Visit: Payer: 59 | Admitting: Internal Medicine

## 2019-07-09 ENCOUNTER — Other Ambulatory Visit: Payer: Self-pay

## 2019-07-09 DIAGNOSIS — E119 Type 2 diabetes mellitus without complications: Secondary | ICD-10-CM

## 2019-07-10 LAB — HEMOGLOBIN A1C
Hgb A1c MFr Bld: 5.4 % of total Hgb (ref <5.7)
Mean Plasma Glucose: 108 (calc)
eAG (mmol/L): 6 (calc)

## 2019-07-13 ENCOUNTER — Other Ambulatory Visit: Payer: Self-pay | Admitting: Internal Medicine

## 2019-07-16 ENCOUNTER — Ambulatory Visit: Payer: 59 | Admitting: Internal Medicine

## 2019-07-16 ENCOUNTER — Other Ambulatory Visit: Payer: Self-pay

## 2019-07-16 ENCOUNTER — Encounter: Payer: Self-pay | Admitting: Internal Medicine

## 2019-07-16 VITALS — BP 120/80 | HR 78 | Temp 98.6°F | Ht 72.0 in | Wt 220.0 lb

## 2019-07-16 DIAGNOSIS — Z6829 Body mass index (BMI) 29.0-29.9, adult: Secondary | ICD-10-CM

## 2019-07-16 DIAGNOSIS — Z8719 Personal history of other diseases of the digestive system: Secondary | ICD-10-CM | POA: Diagnosis not present

## 2019-07-16 DIAGNOSIS — I10 Essential (primary) hypertension: Secondary | ICD-10-CM

## 2019-07-16 DIAGNOSIS — K580 Irritable bowel syndrome with diarrhea: Secondary | ICD-10-CM

## 2019-07-16 DIAGNOSIS — E119 Type 2 diabetes mellitus without complications: Secondary | ICD-10-CM

## 2019-07-16 NOTE — Patient Instructions (Signed)
Continue current medications.  Continue good progress with diabetic control.  Follow-up in 4 months.  It was a pleasure to see you today.

## 2019-07-16 NOTE — Progress Notes (Signed)
   Subjective:    Patient ID: [name] [name], male    DOB: 02/23/1956, 63 y.o.   MRN: [id number]  HPI  Has lost 9 pounds since May. BP has been excellent on Benicar. Accuchecks are excellent. Very  active s/p hip replacement June 25. BP stable on current regimen. Riding horse every day. Following low calorie diet. Accuchecks atable.  Hip pain resolved. A little bit of stiffness is all he feels.    Review of Systems no new complaints.     Objective:   Physical Exam  BP 120/80.      Assessment & Plan:  Type 2 diabetes mellitus with hemoglobin A1c 7.3%.  Now on Glucotrol 5 mg daily and hemoglobin A1c is 5.4%.  Essential hypertension-stable on Benicar 20 mg daily and HCTZ 25 mg daily.  Anxiety and irritable bowel syndrome treated with Xanax.  GE reflux treated with ranitidine  Plan: Continue current medications.  I am pleased with his progress with diabetic control and his blood pressure control.  Continue to watch weight.  He could stand to lose a little more weight.  Follow-up in 4 months with hemoglobin A1c being met and office visit.

## 2019-07-18 ENCOUNTER — Other Ambulatory Visit: Payer: Self-pay | Admitting: Internal Medicine

## 2019-07-18 NOTE — Telephone Encounter (Signed)
Does pt need these?

## 2019-07-18 NOTE — Telephone Encounter (Signed)
Patient called to request refills, he said his diverticulitis has flared up and he is very sure that's what it is. He said he his abd is hurting every time he moves around he said if he needs to come in he will.

## 2019-07-20 ENCOUNTER — Other Ambulatory Visit: Payer: Self-pay | Admitting: Internal Medicine

## 2019-07-29 ENCOUNTER — Encounter: Payer: 59 | Admitting: Internal Medicine

## 2019-07-30 ENCOUNTER — Other Ambulatory Visit: Payer: Self-pay | Admitting: Internal Medicine

## 2019-07-30 DIAGNOSIS — K5792 Diverticulitis of intestine, part unspecified, without perforation or abscess without bleeding: Secondary | ICD-10-CM

## 2019-07-30 NOTE — Telephone Encounter (Signed)
Is patient ill with diverticulitis?

## 2019-08-22 ENCOUNTER — Other Ambulatory Visit: Payer: Self-pay | Admitting: Internal Medicine

## 2019-08-22 ENCOUNTER — Other Ambulatory Visit: Payer: Self-pay | Admitting: Orthopaedic Surgery

## 2019-08-22 MED ORDER — METHOCARBAMOL 500 MG PO TABS: 500.0000 mg | ORAL_TABLET | Freq: Four times a day (QID) | ORAL | 0 refills | Status: DC | PRN

## 2019-08-22 NOTE — Telephone Encounter (Signed)
He does not need to be on the aspiring any more.

## 2019-08-22 NOTE — Telephone Encounter (Signed)
Please advise 

## 2019-08-29 ENCOUNTER — Other Ambulatory Visit: Payer: Self-pay | Admitting: Internal Medicine

## 2019-09-18 ENCOUNTER — Ambulatory Visit: Payer: 59 | Admitting: Orthopaedic Surgery

## 2019-09-22 ENCOUNTER — Other Ambulatory Visit: Payer: Self-pay | Admitting: Internal Medicine

## 2019-09-24 ENCOUNTER — Encounter: Payer: Self-pay | Admitting: Orthopaedic Surgery

## 2019-09-24 ENCOUNTER — Ambulatory Visit (INDEPENDENT_AMBULATORY_CARE_PROVIDER_SITE_OTHER): Payer: Medicare Other | Admitting: Orthopaedic Surgery

## 2019-09-24 ENCOUNTER — Ambulatory Visit: Payer: Self-pay

## 2019-09-24 DIAGNOSIS — M7711 Lateral epicondylitis, right elbow: Secondary | ICD-10-CM | POA: Diagnosis not present

## 2019-09-24 DIAGNOSIS — Z96641 Presence of right artificial hip joint: Secondary | ICD-10-CM

## 2019-09-24 DIAGNOSIS — M25551 Pain in right hip: Secondary | ICD-10-CM

## 2019-09-24 NOTE — Progress Notes (Signed)
Office Visit Note   Patient: [name] [name]           Date of Birth: 01-28-1956           MRN: [id number] Visit Date: 09/24/2019              Requested by: [name] Showers, MD [address] [place],  [place] [id number] PCP: [name] Showers, MD   Assessment & Plan: Visit Diagnoses:  1. History of right hip replacement   2. Lateral epicondylitis, right elbow     Plan: He shown IT band stretching exercises.  In regards to the lateral epicondylitis he is shown stretching exercises. May consider formal therapy for his hip and elbow. Follow up at one year s/p right total hip arthroplasty. Ap pelvis and lateral right hip radiographs at that time.   Follow-Up Instructions: Return in about 8 months (around 05/23/2020).   Orders:  Orders Placed This Encounter  Procedures  . XR HIP UNILAT W OR W/O PELVIS 1V RIGHT   No orders of the defined types were placed in this encounter.     Procedures: No procedures performed   Clinical Data: No additional findings.   Subjective: Chief Complaint  Patient presents with  . Right Hip - Follow-up    HPI [name] [name] returns today follow-up of his right total hip arthroplasty is now 5 months status post.  States range of motion strength improving.  However he still has problems putting on his shoes and socks due to lack of range of motion of the hip he is unable to cross leg.  He is having pain lateral aspect of the hip. He also is having right elbow pain.  No known injury.  Pain is worse when lifting objects and also driving causes his pain become worse.  It does awaken him at night.  He is having no numbness tingling down the arm.  Review of Systems Please see HPI otherwise negative.  Objective: Vital Signs: There were no vitals taken for this visit.  Physical Exam Constitutional:      Appearance: He is not ill-appearing.  Pulmonary:     Effort: Pulmonary effort is normal.  Neurological:     Mental Status: He is alert and  oriented to person, place, and time.     Ortho Exam Right hip surgical incisions healing well.  Slight limitation of motion but otherwise fluid motion and circumflexion.  Tenderness over the right greater trochanteric region. Right elbow tenderness over the lateral epicondyle region.  Extension of the hand against resistance causes pain epicondyle region.  Otherwise good range of motion elbow full supination pronation forearm.  No rashes skin lesions ulcerations edema or ecchymosis about the right elbow. Specialty Comments:  No specialty comments available.  Imaging: Xr Hip Unilat W Or W/o Pelvis 1v Right  Result Date: 09/24/2019 AP pelvis lateral view right hip: Status post right total hip arthroplasty well-seated components.  No hardware failure.  No acute fractures no bony abnormalities.    PMFS History: Patient Active Problem List   Diagnosis Date Noted  . Status post total replacement of right hip 05/10/2019  . Unilateral primary osteoarthritis, right hip 01/24/2019  . GERD (gastroesophageal reflux disease) 09/16/2013  . HTN (hypertension) 06/07/2011  . Irritable bowel 06/07/2011  . DIVERTICULITIS-COLON 11/27/2008   Past Medical History:  Diagnosis Date  . Anxiety    mild  . Arthritis    Rt hip  . Cancer ([place]) 04/2019   Skin Rt hand. Dr.  Jones  . Diabetes mellitus without complication ([place])   . GERD (gastroesophageal reflux disease)    Tums or omeprozole  . History of IBS   . History of kidney stones    30 years ago  . Hypertension 2009   Meds controlled    Family History  Problem Relation Age of Onset  . Cancer [name] [name]     Past Surgical History:  Procedure Laterality Date  . COLONOSCOPY     has IBS  . renal stone    . TOTAL HIP ARTHROPLASTY Right 05/10/2019   Procedure: RIGHT TOTAL HIP ARTHROPLASTY ANTERIOR APPROACH;  Surgeon: [name] Rossetti, MD;  Location: [facility];  Service: Orthopedics;  Laterality: Right;   Social History   Occupational History   . Not on file  Tobacco Use  . Smoking status: Never Smoker  . Smokeless tobacco: Never Used  Substance and Sexual Activity  . Alcohol use: Yes    Alcohol/week: 2.0 - 3.0 standard drinks    Types: 2 - 3 Shots of liquor per week  . Drug use: No  . Sexual activity: Yes

## 2019-10-14 ENCOUNTER — Other Ambulatory Visit: Payer: Self-pay | Admitting: Internal Medicine

## 2019-11-14 ENCOUNTER — Other Ambulatory Visit: Payer: Self-pay | Admitting: Internal Medicine

## 2019-11-18 ENCOUNTER — Other Ambulatory Visit: Payer: Self-pay

## 2019-11-18 ENCOUNTER — Other Ambulatory Visit: Payer: Medicare Other | Admitting: Internal Medicine

## 2019-11-18 DIAGNOSIS — I10 Essential (primary) hypertension: Secondary | ICD-10-CM

## 2019-11-18 DIAGNOSIS — E119 Type 2 diabetes mellitus without complications: Secondary | ICD-10-CM

## 2019-11-18 DIAGNOSIS — Z5181 Encounter for therapeutic drug level monitoring: Secondary | ICD-10-CM

## 2019-11-19 ENCOUNTER — Other Ambulatory Visit: Payer: Self-pay

## 2019-11-19 ENCOUNTER — Ambulatory Visit (INDEPENDENT_AMBULATORY_CARE_PROVIDER_SITE_OTHER): Payer: Medicare Other | Admitting: Internal Medicine

## 2019-11-19 ENCOUNTER — Encounter: Payer: Self-pay | Admitting: Internal Medicine

## 2019-11-19 VITALS — BP 120/80 | HR 69 | Temp 98.6°F | Ht 72.0 in | Wt 226.0 lb

## 2019-11-19 DIAGNOSIS — E119 Type 2 diabetes mellitus without complications: Secondary | ICD-10-CM | POA: Diagnosis not present

## 2019-11-19 DIAGNOSIS — K580 Irritable bowel syndrome with diarrhea: Secondary | ICD-10-CM | POA: Diagnosis not present

## 2019-11-19 DIAGNOSIS — Z0000 Encounter for general adult medical examination without abnormal findings: Secondary | ICD-10-CM

## 2019-11-19 DIAGNOSIS — Z8719 Personal history of other diseases of the digestive system: Secondary | ICD-10-CM

## 2019-11-19 DIAGNOSIS — I10 Essential (primary) hypertension: Secondary | ICD-10-CM | POA: Diagnosis not present

## 2019-11-19 DIAGNOSIS — Z23 Encounter for immunization: Secondary | ICD-10-CM | POA: Diagnosis not present

## 2019-11-19 LAB — HEMOGLOBIN A1C
Hgb A1c MFr Bld: 5.4 % of total Hgb (ref <5.7)
Mean Plasma Glucose: 108 (calc)
eAG (mmol/L): 6 (calc)

## 2019-11-19 LAB — BASIC METABOLIC PANEL
BUN: 20 mg/dL (ref 7–25)
CO2: 27 mmol/L (ref 20–32)
Calcium: 9.1 mg/dL (ref 8.6–10.3)
Chloride: 102 mmol/L (ref 98–110)
Creat: 0.94 mg/dL (ref 0.70–1.25)
Glucose, Bld: 110 mg/dL — ABNORMAL HIGH (ref 65–99)
Potassium: 3.8 mmol/L (ref 3.5–5.3)
Sodium: 139 mmol/L (ref 135–146)

## 2019-11-19 MED ORDER — ALBUTEROL SULFATE HFA 108 (90 BASE) MCG/ACT IN AERS: 2.0000 | INHALATION_SPRAY | Freq: Four times a day (QID) | RESPIRATORY_TRACT | 99 refills | Status: DC | PRN

## 2019-11-19 MED ORDER — DUEXIS 800-26.6 MG PO TABS: ORAL_TABLET | ORAL | 2 refills | Status: DC

## 2019-11-19 NOTE — Patient Instructions (Addendum)
Return in a few weeks for Prevnar 13. Flu vaccine given today. RTC in 6 months. Exercise more.  Continue current medications.  Try albuterol inhaler.  Prescription for Duexis as directed.

## 2019-11-19 NOTE — Progress Notes (Signed)
   Subjective:    Patient ID: [name] [name], male    DOB: January 17, 1956, 63 y.o.   MRN: [id number]  HPI 63 year old Male for follow up of DM and HTN.   Wants Duexis for musculoskeletal pain.It contains 800mg  ibuprofen and 26.6 mg famotidine.  Leaving for [facility] December 8th. Flu vaccine given. Will get Prevnar after returns from horse show in [place].   Needs to exercise daily on treadmill. Has gained about 6 pounds.  Coughs in cold air. Albuterol Inhaler ordered to try.    Review of Systems see above     Objective:   Physical Exam 120/80, Weight 226 punds, BMI 30.65, Pulse 69, T 98.6  Skin:warm and dry. Nodes none. Neck supple, no JVD, Chest clear to auscultation.  Cardiac exam regular rate and rhythm normal S1 and S2.  No lower extremity edema.       Assessment & Plan:  Essential HTN-stable on current regimen  AODM- Hgb AIC-excellent at 5.4%  Weight gain- needs to get on treadmill daily and lose some weight  Musculoskeletal pain- asking for Duexis 800-prescription ordered.  Advised patient we will not do politzerization.  May need to pay out-of-pocket  Cough and cold air-?  Asthma variant try albuterol inhaler  Plan: Flu vaccine given.  After he returns from or showing [place] he can come get Prevnar 13.  Follow-up here in 6 months for health maintenance exam.  Continue current medications.  Try to keep weight closer to 200 pounds.  Try albuterol inhaler.  25 minutes spent with patient including reviewing records and lab work and making recommendations as well as time in exam room with examination

## 2019-11-20 ENCOUNTER — Other Ambulatory Visit: Payer: Self-pay | Admitting: Internal Medicine

## 2019-11-20 ENCOUNTER — Other Ambulatory Visit: Payer: Self-pay

## 2019-11-20 MED ORDER — ALBUTEROL SULFATE HFA 108 (90 BASE) MCG/ACT IN AERS: 2.0000 | INHALATION_SPRAY | Freq: Four times a day (QID) | RESPIRATORY_TRACT | 11 refills | Status: DC | PRN

## 2019-12-12 ENCOUNTER — Other Ambulatory Visit: Payer: Self-pay | Admitting: Internal Medicine

## 2019-12-14 IMAGING — XA DG FLUORO GUIDE NDL PLC/BX
1 series · 1 of 1 positions shown · non-contrast
Comparison: none

CLINICAL DATA: Chronic, worsening right hip pain.

[Series 1: ortho standard · 1 of 1 slices shown]
[im 1/1]
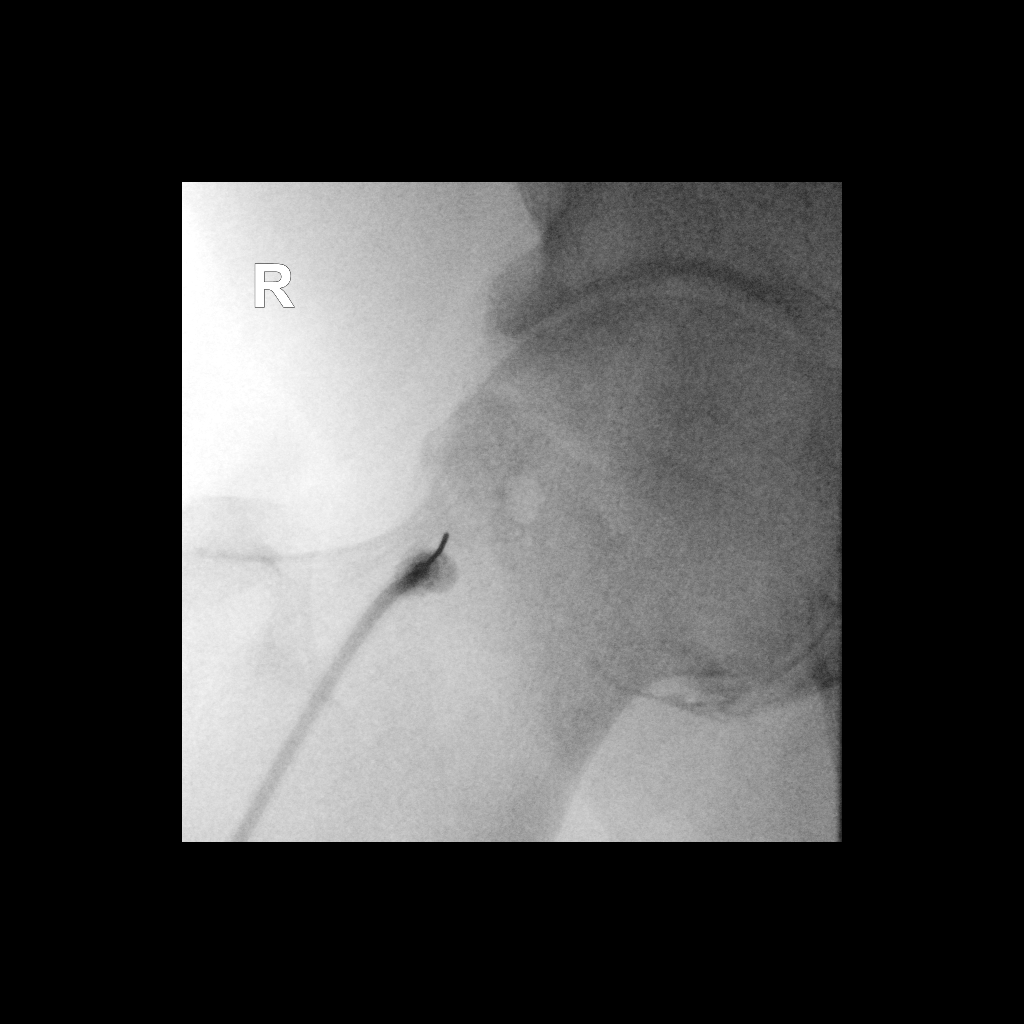

[1 of 1 positions shown; findings below may reference images not displayed]

FLUOROSCOPY TIME:  Radiation Exposure Index (as provided by the
fluoroscopic device): 1.0 mGy

Number of Acquired Images:  0

PROCEDURE:
The risks and benefits of the procedure were discussed with the
patient, and written informed consent was obtained. The patient
stated no history of allergy to contrast media. A formal timeout
procedure was performed with the patient according to departmental
protocol.

The patient was placed supine on the fluoroscopy table and the right
hip joint was identified under fluoroscopy. The skin overlying the
right hip joint was subsequently cleaned with Betadine and a sterile
drape was placed over the area of interest. 2 ml 1% Lidocaine was
used to anesthetize the skin around the needle insertion site.

A 3.5 inch 22 gauge spinal needle was inserted into the right hip
joint under fluoroscopy.

Diagnostic injection of 2 ml Isovue-M 200 iodinated contrast
demonstrates intra-articular spread without intravascular component.

120mg Depo-Medrol and 5 ml Sensorcaine 0.5% were then administered
into the right hip joint.

The needle was removed and hemostasis was achieved. There were no
immediate complications.
IMPRESSION: Technically successful fluoroscopically guided right hip steroid
injection.

## 2019-12-20 ENCOUNTER — Other Ambulatory Visit: Payer: Self-pay | Admitting: Internal Medicine

## 2020-01-07 ENCOUNTER — Other Ambulatory Visit: Payer: Self-pay | Admitting: Internal Medicine

## 2020-01-16 ENCOUNTER — Ambulatory Visit: Payer: Medicare Other | Admitting: Orthopaedic Surgery

## 2020-01-20 ENCOUNTER — Encounter: Payer: Self-pay | Admitting: Physician Assistant

## 2020-01-20 ENCOUNTER — Ambulatory Visit (INDEPENDENT_AMBULATORY_CARE_PROVIDER_SITE_OTHER): Payer: Medicare Other | Admitting: Physician Assistant

## 2020-01-20 ENCOUNTER — Other Ambulatory Visit: Payer: Self-pay

## 2020-01-20 DIAGNOSIS — M7711 Lateral epicondylitis, right elbow: Secondary | ICD-10-CM | POA: Diagnosis not present

## 2020-01-20 MED ORDER — LIDOCAINE HCL 1 % IJ SOLN
1.0000 mL | INTRAMUSCULAR | Status: AC | PRN
  Administered 2020-01-20: 1 mL

## 2020-01-20 MED ORDER — METHYLPREDNISOLONE ACETATE 40 MG/ML IJ SUSP
40.0000 mg | INTRAMUSCULAR | Status: AC | PRN
  Administered 2020-01-20: 40 mg

## 2020-01-20 NOTE — Progress Notes (Signed)
Office Visit Note   Patient: [name] [name]           Date of Birth: [birth year]           MRN: [id number] Visit Date: 01/20/2020              Requested by: Elby Showers, MD [address] [place],   [address] PCP: Elby Showers, MD   Assessment & Plan: Visit Diagnoses:  1. Lateral epicondylitis, right elbow     Plan: Discussed lifting techniques with him.  Gentle stretching techniques.  He will apply Voltaren gel approximately 2 g 4 times daily to the lateral epicondyle region.  Follow-up with [place] as needed.  Questions encouraged and answered.  Follow-Up Instructions: No follow-ups on file.   Orders:  No orders of the defined types were placed in this encounter.  No orders of the defined types were placed in this encounter.     Procedures: Hand/UE Inj: R elbow for lateral epicondylitis on 01/20/2020 10:34 AM Medications: 1 mL lidocaine 1 %; 40 mg methylPREDNISolone acetate 40 MG/ML      Clinical Data: No additional findings.   Subjective: Chief Complaint  Patient presents with  . Right Elbow - Pain    HPI [name] returns today due to right elbow pain for the last 2 weeks.  He states previous injection elbows helped until recently.  He is doing some weightlifting feels this may have exacerbated the elbow pain.  No known injury. Review of Systems Negative for fevers chills shortness of breath chest pain  Objective: Vital Signs: There were no vitals taken for this visit.  Physical Exam Constitutional:      Appearance: He is not ill-appearing or diaphoretic.  Pulmonary:     Effort: Pulmonary effort is normal.  Neurological:     Mental Status: He is alert and oriented to person, place, and time.  Psychiatric:        Mood and Affect: Mood normal.     Ortho Exam Right elbow: Full extension flexion.  No rashes skin lesions ulcerations.  No obvious bony abnormalities.  Full supination pronation of the right forearm.  Tenderness over the  right lateral epicondyle region. Specialty Comments:  No specialty comments available.  Imaging: No results found.   PMFS History: Patient Active Problem List   Diagnosis Date Noted  . Status post total replacement of right hip 05/10/2019  . Unilateral primary osteoarthritis, right hip 01/24/2019  . GERD (gastroesophageal reflux disease) 09/16/2013  . HTN (hypertension) 06/07/2011  . Irritable bowel 06/07/2011  . DIVERTICULITIS-COLON 11/27/2008   Past Medical History:  Diagnosis Date  . Anxiety    mild  . Arthritis    Rt hip  . Cancer ([place]) 04/2019   Skin Rt hand. Dr. Ronnald Ramp  . Diabetes mellitus without complication ([place])   . GERD (gastroesophageal reflux disease)    Tums or omeprozole  . History of IBS   . History of kidney stones    30 years ago  . Hypertension 2009   Meds controlled    Family History  Problem Relation Age of Onset  . Cancer Father     Past Surgical History:  Procedure Laterality Date  . COLONOSCOPY     has IBS  . renal stone    . TOTAL HIP ARTHROPLASTY Right 05/10/2019   Procedure: RIGHT TOTAL HIP ARTHROPLASTY ANTERIOR APPROACH;  Surgeon: Mcarthur Rossetti, MD;  Location: [facility];  Service: Orthopedics;  Laterality: Right;   Social  History   Occupational History  . Not on file  Tobacco Use  . Smoking status: Never Smoker  . Smokeless tobacco: Never Used  Substance and Sexual Activity  . Alcohol use: Yes    Alcohol/week: 2.0 - 3.0 standard drinks    Types: 2 - 3 Shots of liquor per week  . Drug use: No  . Sexual activity: Yes

## 2020-02-05 ENCOUNTER — Other Ambulatory Visit: Payer: Self-pay | Admitting: Internal Medicine

## 2020-02-07 ENCOUNTER — Ambulatory Visit: Payer: Medicare Other

## 2020-03-09 ENCOUNTER — Ambulatory Visit: Payer: Medicare Other

## 2020-03-10 ENCOUNTER — Ambulatory Visit: Payer: Medicare Other | Attending: Internal Medicine

## 2020-03-10 ENCOUNTER — Other Ambulatory Visit: Payer: Self-pay | Admitting: Orthopaedic Surgery

## 2020-03-10 ENCOUNTER — Other Ambulatory Visit: Payer: Self-pay | Admitting: Internal Medicine

## 2020-03-10 DIAGNOSIS — Z23 Encounter for immunization: Secondary | ICD-10-CM

## 2020-03-10 NOTE — Telephone Encounter (Signed)
Xanax refilled but needs CPE and labs in the near future

## 2020-03-10 NOTE — Telephone Encounter (Signed)
He is due in May he has an appointment.

## 2020-03-10 NOTE — Progress Notes (Signed)
   Covid-19 Vaccination Clinic  Name:  HERNY HEWES    MRN: [id number] DOB: [birth year]  03/10/2020  Mr. Elbaz was observed post Covid-19 immunization for 15 minutes without incident. He was provided with Vaccine Information Sheet and instruction to access the V-Safe system.   Mr. Scherff was instructed to call 911 with any severe reactions post vaccine: Marland Kitchen Difficulty breathing  . Swelling of face and throat  . A fast heartbeat  . A bad rash all over body  . Dizziness and weakness   Immunizations Administered    Name Date Dose VIS Date Route   Pfizer COVID-19 Vaccine 03/10/2020 11:31 AM 0.3 mL 12/06/2019 Intramuscular   Manufacturer: [place]   Lot: [id number]   [place]: [id number]

## 2020-03-10 NOTE — Telephone Encounter (Signed)
Needs health maintenance exam and labs.

## 2020-03-10 NOTE — Telephone Encounter (Signed)
Refill x 6 months 

## 2020-03-22 IMAGING — RF OPERATIVE RIGHT HIP WITH PELVIS
1 series · 7 of 7 positions shown · non-contrast
Comparison: Right hip series 01/24/2019.

FLUOROSCOPY TIME:  0 minutes 17 seconds

CLINICAL DATA: 62-year-old male undergoing right total hip
replacement.

EXAM:
OPERATIVE 7 HIP (WITH PELVIS IF PERFORMED) right VIEWS
TECHNIQUE: Fluoroscopic spot image(s) were submitted for interpretation
post-operatively.

[Series 1: unknown protocol · 0.20mm/px · 7 of 7 slices shown]
[im 1/7]
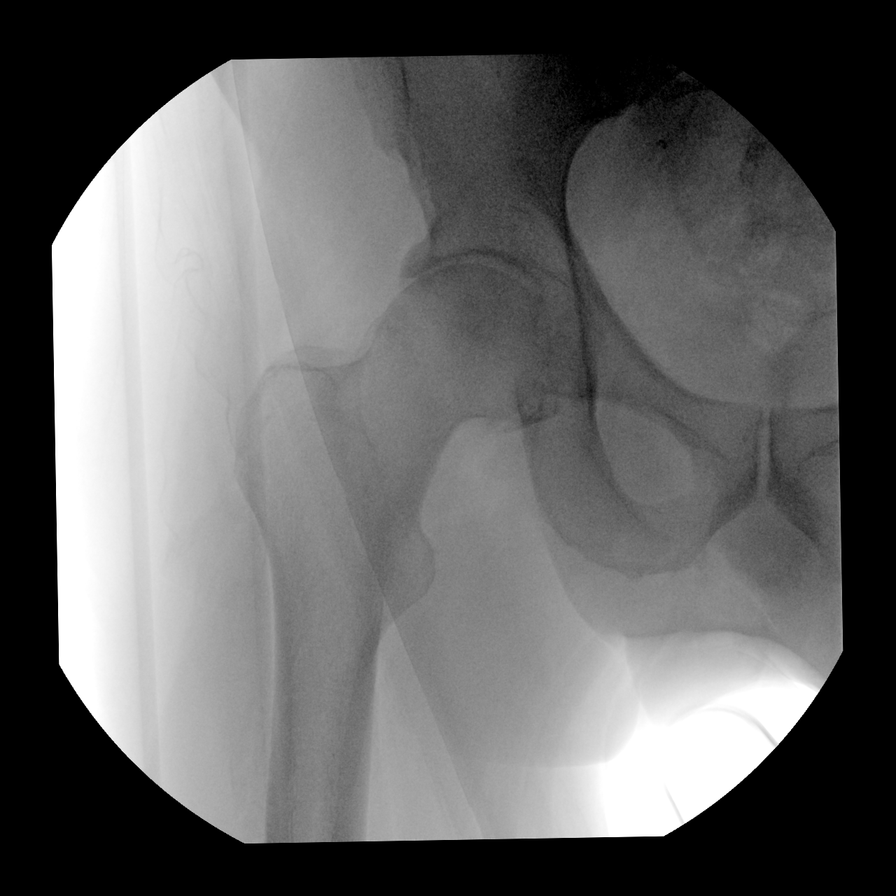
[im 2/7]
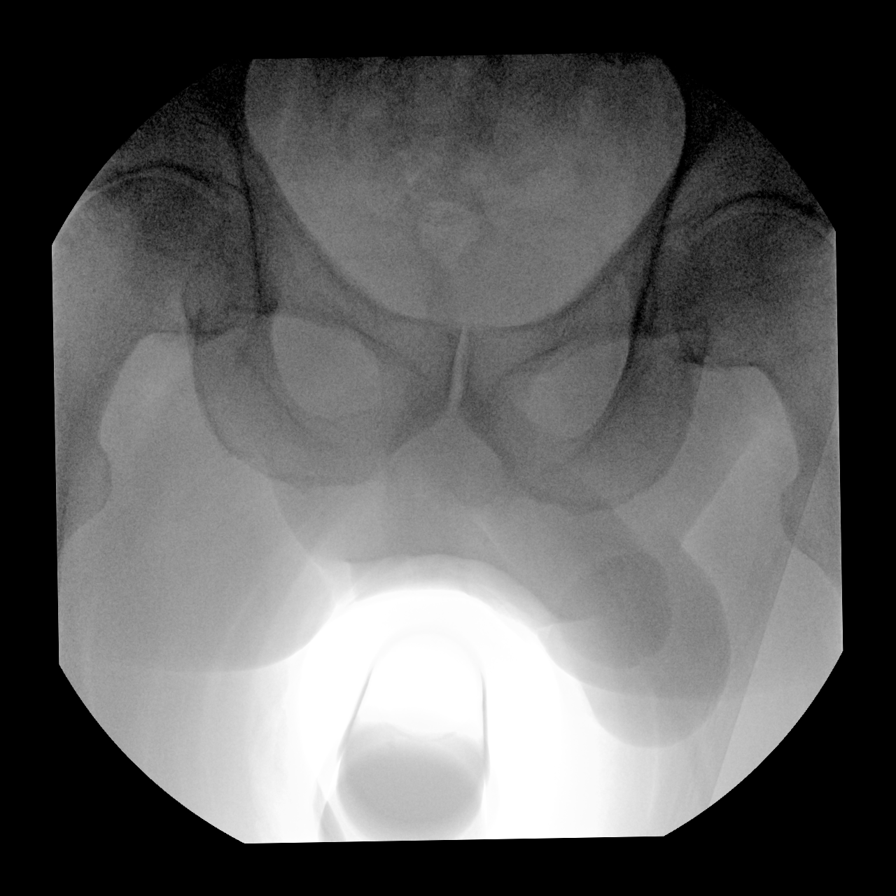
[im 3/7]
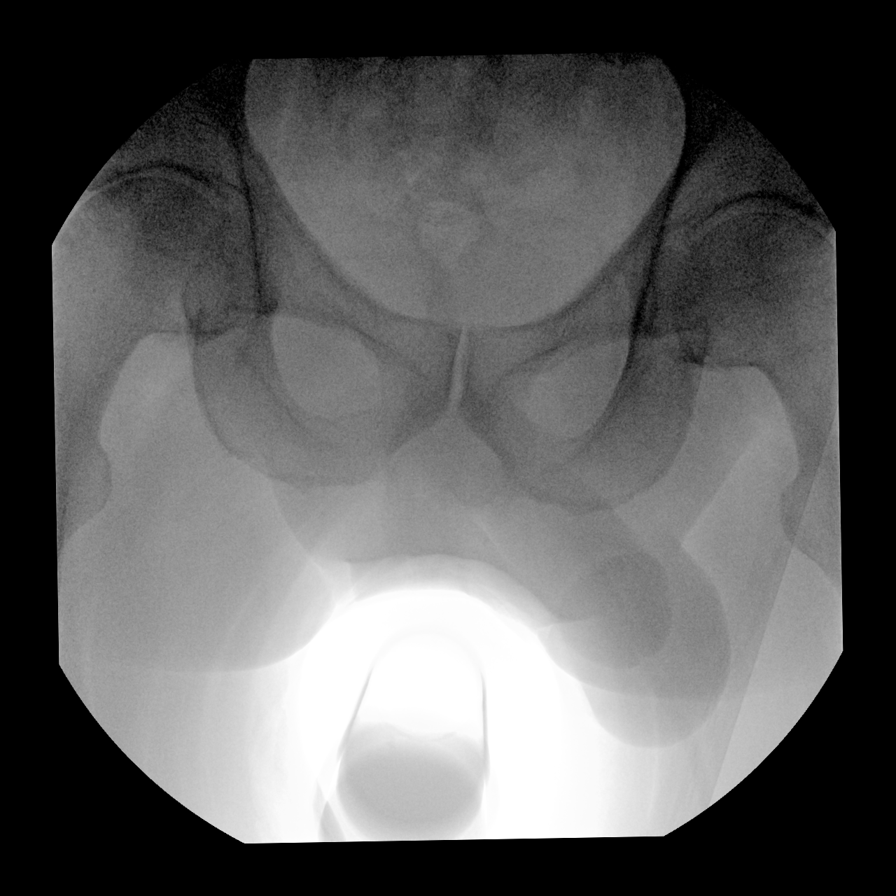
[im 4/7]
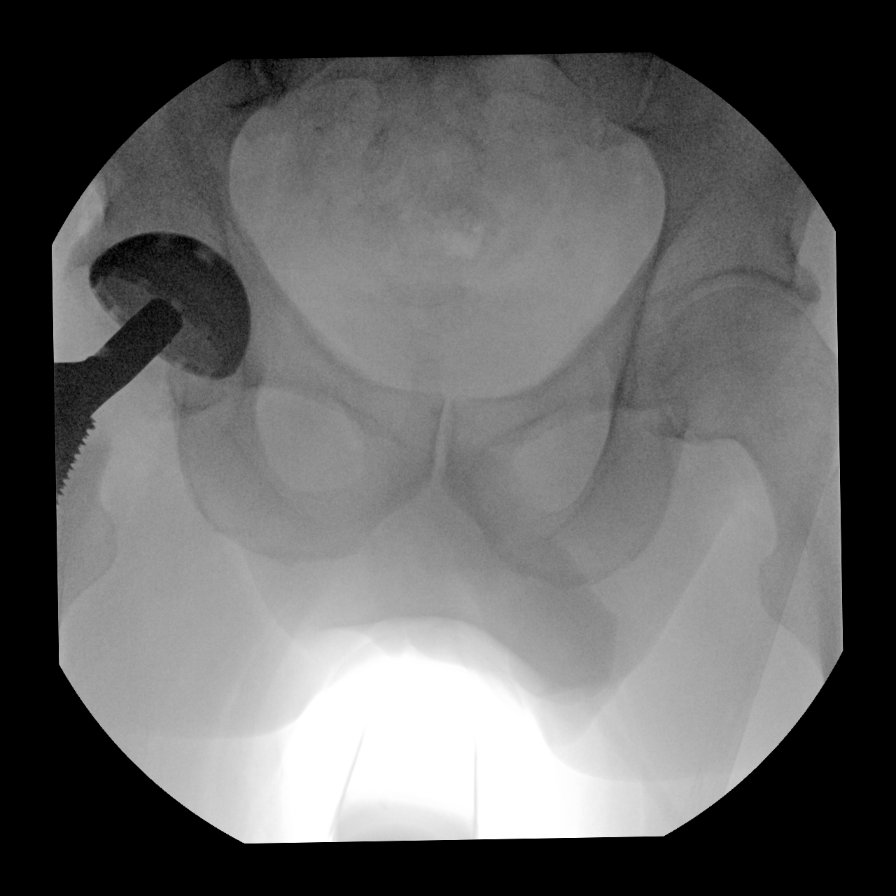
[im 5/7]
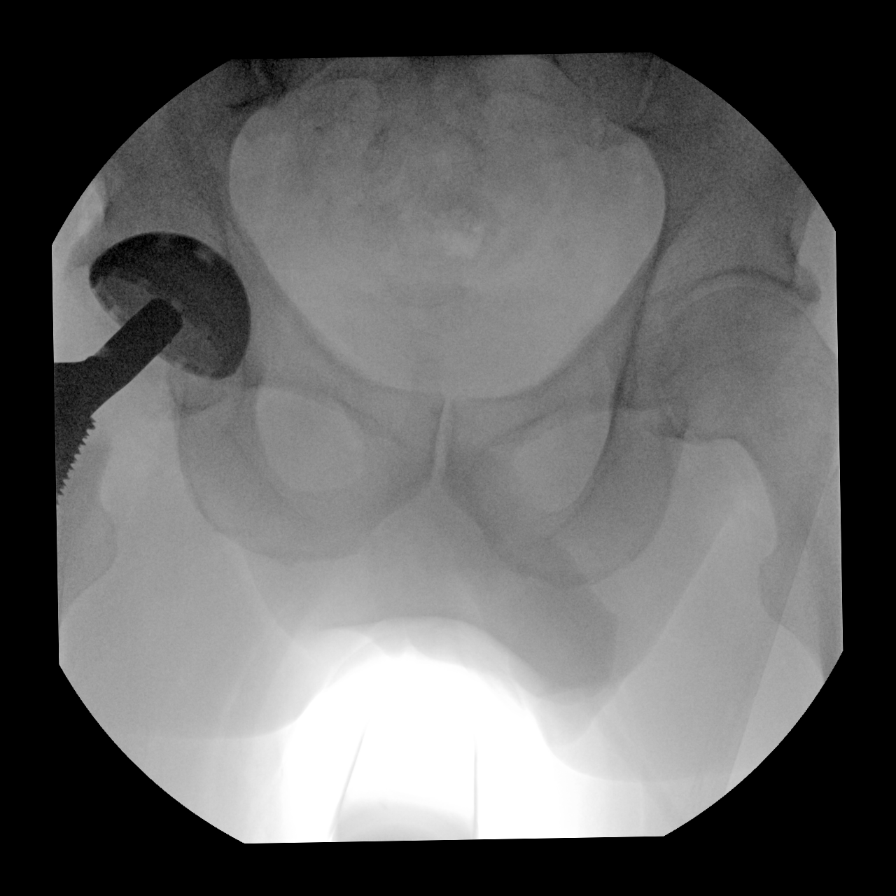
[im 6/7]
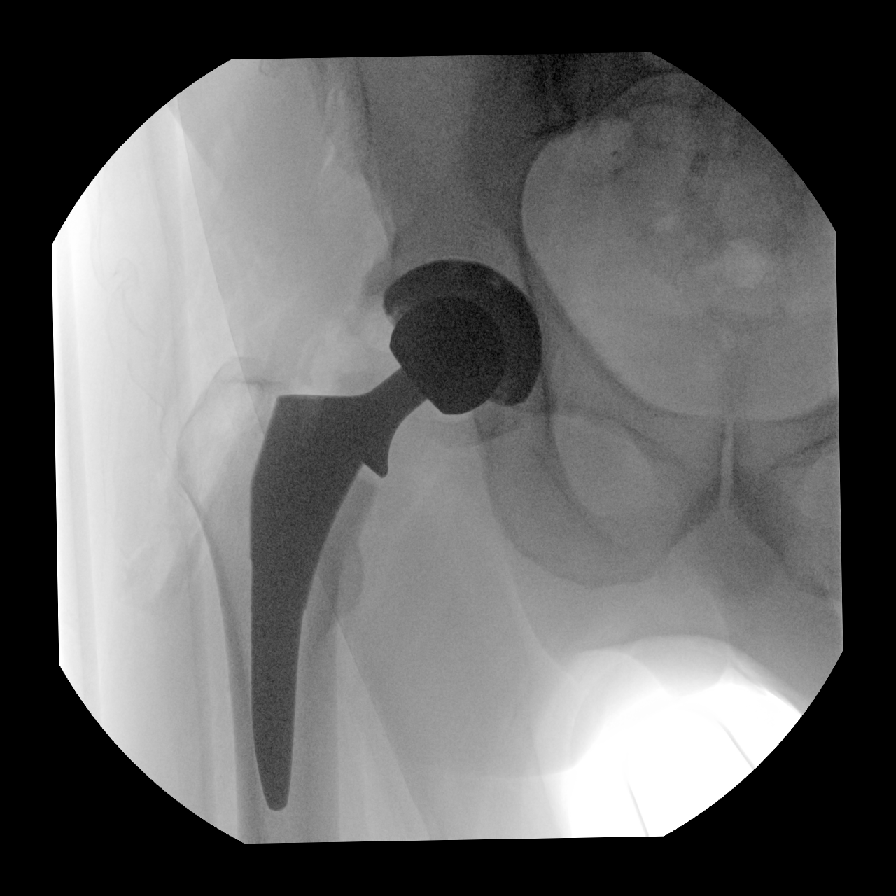
[im 7/7]
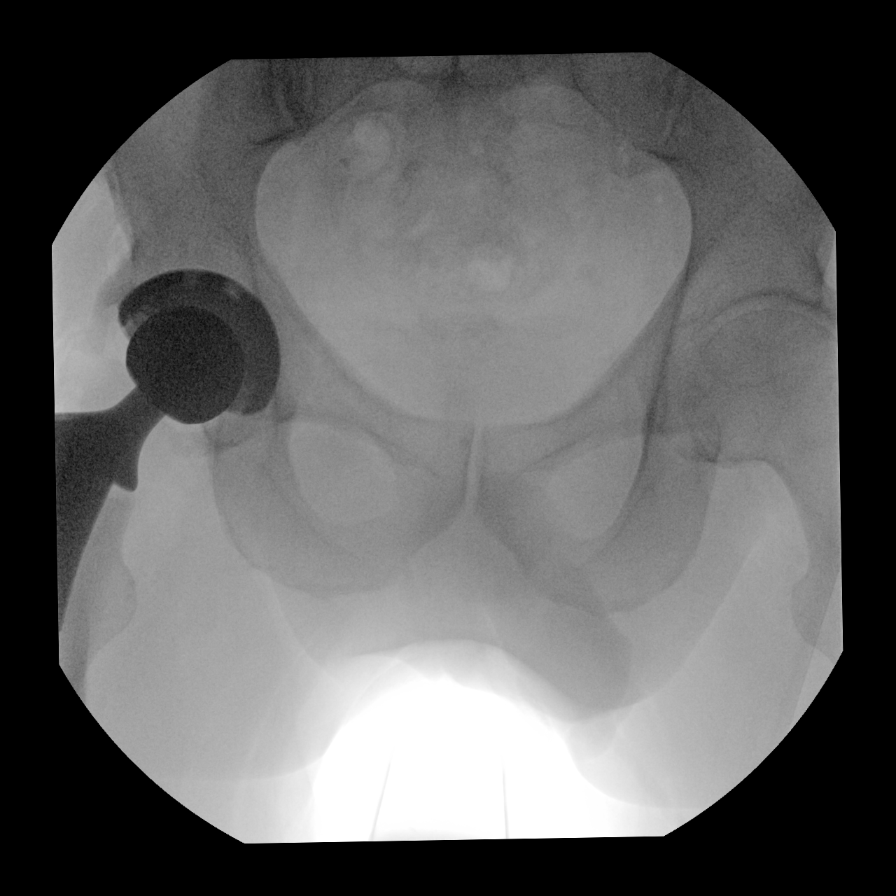

[7 of 7 positions shown; findings below may reference images not displayed]

FINDINGS: Multiple intraoperative fluoroscopic spot views of the right hip and
lower pelvis. Bipolar type hip arthroplasty is placed on the right.
Normal alignment in the AP plane.
IMPRESSION: Right hip arthroplasty with no adverse features.

## 2020-03-22 IMAGING — DX PORTABLE PELVIS 1-2 VIEWS
1 series · 1 of 1 positions shown · non-contrast
Comparison: Intraoperative images from 5077 hours today. Right hip
series 01/24/2019.

CLINICAL DATA: 62-year-old male undergoing right total hip
replacement.

EXAM:
PORTABLE PELVIS 1-2 VIEWS

[pelvis ap]
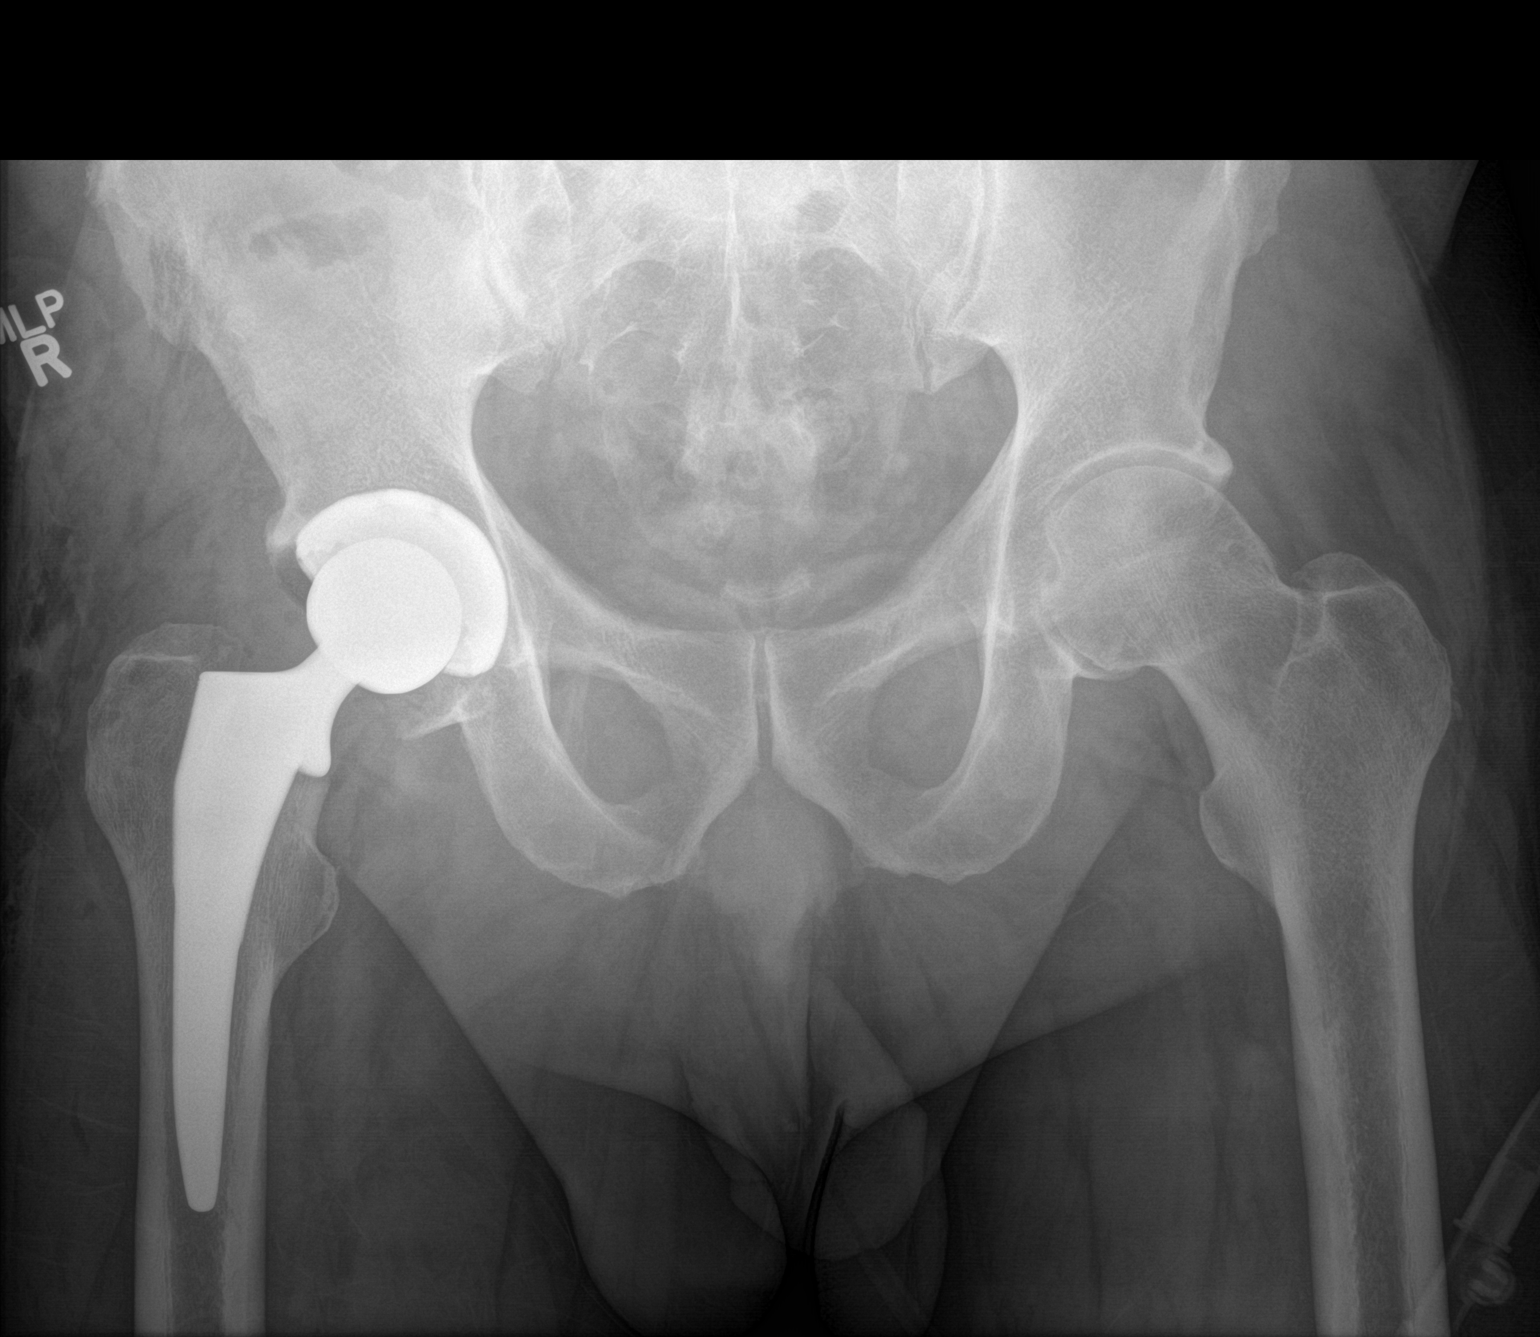

[1 of 1 positions shown; findings below may reference images not displayed]

FINDINGS: Portable AP supine view at 1500 hours. Right side bipolar hip
arthroplasty in place. Normal alignment in the AP plane. No
unexpected osseous changes. Regional postoperative soft tissue gas.
Stable left hip.
IMPRESSION: Right hip arthroplasty with no adverse features.

## 2020-03-31 ENCOUNTER — Ambulatory Visit: Payer: Medicare Other | Attending: Internal Medicine

## 2020-03-31 DIAGNOSIS — Z23 Encounter for immunization: Secondary | ICD-10-CM

## 2020-03-31 NOTE — Progress Notes (Signed)
   Covid-19 Vaccination Clinic  Name:  [name] [name]    MRN: [id number] DOB: [birth year]  03/31/2020  [name] was observed post Covid-19 immunization for 15 minutes without incident. He was provided with Vaccine Information Sheet and instruction to access the V-Safe system.   [name] was instructed to call 911 with any severe reactions post vaccine: Marland Kitchen Difficulty breathing  . Swelling of face and throat  . A fast heartbeat  . A bad rash all over body  . Dizziness and weakness   Immunizations Administered    Name Date Dose VIS Date Route   Pfizer COVID-19 Vaccine 03/31/2020  2:54 PM 0.3 mL 12/06/2019 Intramuscular   Manufacturer: [place]   Lot: [id number]   [place]: [id number]

## 2020-04-16 ENCOUNTER — Other Ambulatory Visit: Payer: Self-pay

## 2020-04-16 ENCOUNTER — Encounter: Payer: Self-pay | Admitting: Physician Assistant

## 2020-04-16 ENCOUNTER — Ambulatory Visit (INDEPENDENT_AMBULATORY_CARE_PROVIDER_SITE_OTHER): Payer: Medicare Other | Admitting: Physician Assistant

## 2020-04-16 DIAGNOSIS — M7712 Lateral epicondylitis, left elbow: Secondary | ICD-10-CM

## 2020-04-16 MED ORDER — METHYLPREDNISOLONE ACETATE 40 MG/ML IJ SUSP
20.0000 mg | INTRAMUSCULAR | Status: AC | PRN
  Administered 2020-04-16: 20 mg

## 2020-04-16 MED ORDER — LIDOCAINE HCL 1 % IJ SOLN
0.5000 mL | INTRAMUSCULAR | Status: AC | PRN
  Administered 2020-04-16: .5 mL

## 2020-04-16 NOTE — Progress Notes (Signed)
Office Visit Note   Patient: [name] [name]           Date of Birth: January 09, 1956           MRN: [id number] Visit Date: 04/16/2020              Requested by: Elby Showers, MD [address] [place],  [place] [address] PCP: Elby Showers, MD   Assessment & Plan: Visit Diagnoses:  1. Lateral epicondylitis, left elbow     Plan: Discussed with him continuing to use Voltaren gel on elbows, continue stretching and lessen his grip on objects particularly when driving.  He also may benefit from deep tissue massage he does have a massage therapist and will ask them about deep tissue massage on his elbows.  He will follow up with [place] on as-needed basis.  Questions encouraged and answered.  Follow-Up Instructions: Return if symptoms worsen or fail to improve.   Orders:  No orders of the defined types were placed in this encounter.  No orders of the defined types were placed in this encounter.     Procedures: Hand/UE Inj: L elbow for lateral epicondylitis on 04/16/2020 2:45 PM Details: lateral approach Medications: 0.5 mL lidocaine 1 %; 20 mg methylPREDNISolone acetate 40 MG/ML Consent was given by the patient. Immediately prior to procedure a time out was called to verify the correct patient, procedure, equipment, support staff and site/side marked as required. Patient was prepped and draped in the usual sterile fashion.       Clinical Data: No additional findings.   Subjective: Chief Complaint  Patient presents with  . Left Elbow - Pain    HPI Mr. [name] returns today now complaining of left elbow pain for the last 2 weeks.  Has been doing a lot of driving From horse showed [place].  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  He is having pain lateral left elbow similar to what his hand on the right.  Does state that his regular pain is slightly coming back.  Is been icing the area has been trying stretching  states the pain just will not go away.  Is also been using Voltaren gel and ice.  He denies any numbness tingling down either arm. Review of Systems Point see HPI otherwise negative noncontributory.  Objective: Vital Signs: There were no vitals taken for this visit.  Physical Exam Constitutional:      Appearance: He is not ill-appearing or diaphoretic.  Pulmonary:     Effort: Pulmonary effort is normal.  Neurological:     Mental Status: He is alert.  Psychiatric:        Mood and Affect: Mood normal.     Ortho Exam Left elbow good range of motion without pain.  Radial pulses 2+.  There is no rashes skin lesions ulcerations erythema or edema about the left elbow.  He has tenderness over the lateral left epicondyle.  Provocative maneuvers cause pain lateral aspect of the left elbow at the lateral epicondyle. Specialty Comments:  No specialty comments available.  Imaging: No results found.   PMFS History: Patient Active Problem List   Diagnosis Date Noted  . Status post total replacement of right hip 05/10/2019  . Unilateral primary osteoarthritis, right hip 01/24/2019  . GERD (gastroesophageal reflux disease) 09/16/2013  . HTN (hypertension) 06/07/2011  . Irritable bowel 06/07/2011  . DIVERTICULITIS-COLON 11/27/2008   Past Medical History:  Diagnosis Date  .  Anxiety    mild  . Arthritis    Rt hip  . Cancer ([place]) 04/2019   Skin Rt hand. Dr. Ronnald Ramp  . Diabetes mellitus without complication ([place])   . GERD (gastroesophageal reflux disease)    Tums or omeprozole  . History of IBS   . History of kidney stones    30 years ago  . Hypertension 2009   Meds controlled    Family History  Problem Relation Age of Onset  . Cancer Father     Past Surgical History:  Procedure Laterality Date  . COLONOSCOPY     has IBS  . renal stone    . TOTAL HIP ARTHROPLASTY Right 05/10/2019   Procedure: RIGHT TOTAL HIP ARTHROPLASTY ANTERIOR APPROACH;  Surgeon: Mcarthur Rossetti,  MD;  Location: [facility];  Service: Orthopedics;  Laterality: Right;   Social History   Occupational History  . Not on file  Tobacco Use  . Smoking status: Never Smoker  . Smokeless tobacco: Never Used  Substance and Sexual Activity  . Alcohol use: Yes    Alcohol/week: 2.0 - 3.0 standard drinks    Types: 2 - 3 Shots of liquor per week  . Drug use: No  . Sexual activity: Yes

## 2020-05-11 ENCOUNTER — Other Ambulatory Visit: Payer: Medicare Other | Admitting: Internal Medicine

## 2020-05-11 ENCOUNTER — Other Ambulatory Visit: Payer: Self-pay

## 2020-05-11 DIAGNOSIS — Z0000 Encounter for general adult medical examination without abnormal findings: Secondary | ICD-10-CM

## 2020-05-11 DIAGNOSIS — E119 Type 2 diabetes mellitus without complications: Secondary | ICD-10-CM

## 2020-05-11 DIAGNOSIS — K580 Irritable bowel syndrome with diarrhea: Secondary | ICD-10-CM

## 2020-05-11 DIAGNOSIS — K5730 Diverticulosis of large intestine without perforation or abscess without bleeding: Secondary | ICD-10-CM

## 2020-05-11 DIAGNOSIS — I10 Essential (primary) hypertension: Secondary | ICD-10-CM

## 2020-05-11 DIAGNOSIS — Z125 Encounter for screening for malignant neoplasm of prostate: Secondary | ICD-10-CM

## 2020-05-11 DIAGNOSIS — Z8719 Personal history of other diseases of the digestive system: Secondary | ICD-10-CM

## 2020-05-11 NOTE — Addendum Note (Signed)
Addended by: Mady Haagensen on: 05/11/2020 09:44 AM   Modules accepted: Orders

## 2020-05-12 ENCOUNTER — Ambulatory Visit (INDEPENDENT_AMBULATORY_CARE_PROVIDER_SITE_OTHER): Payer: Medicare Other | Admitting: Internal Medicine

## 2020-05-12 ENCOUNTER — Encounter: Payer: Self-pay | Admitting: Internal Medicine

## 2020-05-12 VITALS — BP 130/90 | HR 62 | Temp 98.7°F | Ht 72.0 in | Wt 234.0 lb

## 2020-05-12 DIAGNOSIS — K580 Irritable bowel syndrome with diarrhea: Secondary | ICD-10-CM

## 2020-05-12 DIAGNOSIS — Z96641 Presence of right artificial hip joint: Secondary | ICD-10-CM

## 2020-05-12 DIAGNOSIS — E782 Mixed hyperlipidemia: Secondary | ICD-10-CM

## 2020-05-12 DIAGNOSIS — M25552 Pain in left hip: Secondary | ICD-10-CM | POA: Diagnosis not present

## 2020-05-12 DIAGNOSIS — Z0000 Encounter for general adult medical examination without abnormal findings: Secondary | ICD-10-CM

## 2020-05-12 DIAGNOSIS — E1169 Type 2 diabetes mellitus with other specified complication: Secondary | ICD-10-CM

## 2020-05-12 DIAGNOSIS — I10 Essential (primary) hypertension: Secondary | ICD-10-CM

## 2020-05-12 DIAGNOSIS — Z8719 Personal history of other diseases of the digestive system: Secondary | ICD-10-CM

## 2020-05-12 DIAGNOSIS — Z6831 Body mass index (BMI) 31.0-31.9, adult: Secondary | ICD-10-CM

## 2020-05-12 LAB — COMPLETE METABOLIC PANEL WITH GFR
AG Ratio: 1.7 (calc) (ref 1.0–2.5)
ALT: 33 U/L (ref 9–46)
AST: 26 U/L (ref 10–35)
Albumin: 4.3 g/dL (ref 3.6–5.1)
Alkaline phosphatase (APISO): 52 U/L (ref 35–144)
BUN: 15 mg/dL (ref 7–25)
CO2: 28 mmol/L (ref 20–32)
Calcium: 9.6 mg/dL (ref 8.6–10.3)
Chloride: 101 mmol/L (ref 98–110)
Creat: 1.09 mg/dL (ref 0.70–1.25)
GFR, Est African American: 82 mL/min/{1.73_m2} (ref >=60)
GFR, Est Non African American: 71 mL/min/{1.73_m2} (ref >=60)
Globulin: 2.6 g/dL (calc) (ref 1.9–3.7)
Glucose, Bld: 131 mg/dL — ABNORMAL HIGH (ref 65–99)
Potassium: 3.8 mmol/L (ref 3.5–5.3)
Sodium: 140 mmol/L (ref 135–146)
Total Bilirubin: 0.9 mg/dL (ref 0.2–1.2)
Total Protein: 6.9 g/dL (ref 6.1–8.1)

## 2020-05-12 LAB — POCT URINALYSIS DIPSTICK
Appearance: NEGATIVE
Bilirubin, UA: NEGATIVE
Blood, UA: NEGATIVE
Glucose, UA: NEGATIVE
Ketones, UA: NEGATIVE
Leukocytes, UA: NEGATIVE
Nitrite, UA: NEGATIVE
Odor: NEGATIVE
Protein, UA: NEGATIVE
Spec Grav, UA: 1.01 (ref 1.010–1.025)
Urobilinogen, UA: 0.2 E.U./dL
pH, UA: 6.5 (ref 5.0–8.0)

## 2020-05-12 LAB — CBC WITH DIFFERENTIAL/PLATELET
Absolute Monocytes: 262 cells/uL (ref 200–950)
Basophils Absolute: 31 cells/uL (ref 0–200)
Basophils Relative: 0.9 %
Eosinophils Absolute: 51 cells/uL (ref 15–500)
Eosinophils Relative: 1.5 %
HCT: 43 % (ref 38.5–50.0)
Hemoglobin: 14.8 g/dL (ref 13.2–17.1)
Lymphs Abs: 1618 cells/uL (ref 850–3900)
MCH: 32.9 pg (ref 27.0–33.0)
MCHC: 34.4 g/dL (ref 32.0–36.0)
MCV: 95.6 fL (ref 80.0–100.0)
MPV: 9.4 fL (ref 7.5–12.5)
Monocytes Relative: 7.7 %
Neutro Abs: 1438 cells/uL — ABNORMAL LOW (ref 1500–7800)
Neutrophils Relative %: 42.3 %
Platelets: 171 10*3/uL (ref 140–400)
RBC: 4.5 10*6/uL (ref 4.20–5.80)
RDW: 13.5 % (ref 11.0–15.0)
Total Lymphocyte: 47.6 %
WBC: 3.4 10*3/uL — ABNORMAL LOW (ref 3.8–10.8)

## 2020-05-12 LAB — LIPID PANEL
Cholesterol: 173 mg/dL (ref <200)
HDL: 44 mg/dL (ref >=40)
LDL Cholesterol (Calc): 102 mg/dL (calc) — ABNORMAL HIGH
Non-HDL Cholesterol (Calc): 129 mg/dL (calc) (ref <130)
Total CHOL/HDL Ratio: 3.9 (calc) (ref <5.0)
Triglycerides: 171 mg/dL — ABNORMAL HIGH (ref <150)

## 2020-05-12 LAB — HEMOGLOBIN A1C
Hgb A1c MFr Bld: 5.9 % of total Hgb — ABNORMAL HIGH (ref <5.7)
Mean Plasma Glucose: 123 (calc)
eAG (mmol/L): 6.8 (calc)

## 2020-05-12 LAB — PSA: PSA: 1.6 ng/mL (ref <=4.0)

## 2020-05-12 NOTE — Patient Instructions (Signed)
It was a pleasure to see you today. Work on diet and exercise and RTC in 6 months. Will need Prevnar 13 then. Have flu vaccine this Fall.

## 2020-05-12 NOTE — Progress Notes (Signed)
Subjective:    Patient ID: [name] [name], male    DOB: 1956/07/24, 64 y.o.   MRN: [id number]  HPI 64 year old Male in today for Welcome to Medicare physical examination, health maintenance exam and evaluation of medical issues.  He had total right hip arthroplasty in May 2020 by Dr. Ninfa Linden.  Left hip is bothering him now and he will go back and see him.  Patient has a history of type II diabetes mellitus, essential hypertension, recurrent bouts of diverticulitis, irritable bowel syndrome controlled with alprazolam up to 3 times daily.  He last had colonoscopy in 2005 and was due again in in 2012.  Was found only to have diverticulosis.  In the fall 2019 he suffered an injury to his right median nerve and sprained his left wrist when a horse he was riding jump suddenly injuring his hands.  It took some time for him to improve.  He saw Dr. Leanora Cover.  Social history: Non-smoker.  Social alcohol consumption.  Married.  Wife works with a Counsellor.  One adult son from previous marriage.  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   Family history: Father died with complications of prostate cancer.  Mother died with complications of dementia.  1 brother.  A Cardiolite study in October 2003 was negative.  Welcome to Medicare EKG today is entirely within normal limits.  Lipid panel shows improvement in LDL from 127 last year to 102.  Triglycerides remain elevated at 171.  Total cholesterol was 173.  He is on statin medication.  Hemoglobin A1c 5.9% and stable on medication.  PSA is normal.  Fasting glucose 131.  He has had 2 COVID-19 immunizations.  Tdap vaccine due 2022.  He will get Prevnar 13 at next visit.  Review of Systems no new complaints except for left hip pain.     Objective:   Physical Exam Pulse 62 and regular temperature 98.7 degrees orally pulse oximetry 97% weight 234 pounds height 6  feet 0 inches.  Skin warm and dry.  Nodes none.  TMs clear.  Neck is supple without JVD thyromegaly or carotid bruits.  Chest clear to auscultation.  Cardiac exam regular rate and rhythm normal S1 and S2 without murmurs or gallops.  Abdomen soft nondistended without hepatosplenomegaly masses or tenderness.  Prostate is normal without nodules.  No lower extremity edema.  Neuro intact without focal deficits.  Thought judgment and affect are normal.       Assessment & Plan:  Essential hypertension-stable on current regimen  Hyperlipidemia-she is on statin therapy but needs to watch his diet.  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  Health maintenance-needs colonoscopy and Prevnar 13.  Tetanus will be due in 2022.  Has had 2 COVID-19 immunizations.  Irritable bowel syndrome treated with Xanax and stable this is a longstanding issue for him.  Status post right hip arthroplasty  Left hip pain-to see Dr. Ninfa Linden  Plan: Return in 6 months.  Have flu vaccine this Fall.  Subjective:   Patient presents for Medicare Annual/Subsequent preventive examination.  Review Past Medical/Family/Social: See above   Risk Factors  Current exercise habits: Works around farm and rides horses Dietary issues discussed: Low-fat low carbohydrate  Cardiac risk factors: Hyperlipidemia and diabetes mellitus  Depression Screen  (Note: if answer to either of the following is "Yes", a more complete depression screening is indicated)  Over the past two weeks, have you felt down, depressed or hopeless? No  Over the past two weeks, have you felt little interest or pleasure in doing things? No Have you lost interest or pleasure in daily life? No Do you often feel hopeless? No Do you cry easily over simple problems? No   Activities of Daily Living  In your present state of health, do you have any difficulty performing the following activities?:   Driving? No  Managing money? No   Feeding yourself? No  Getting from bed to chair? No  Climbing a flight of stairs? No  Preparing food and eating?: No  Bathing or showering? No  Getting dressed: No  Getting to the toilet? No  Using the toilet:No  Moving around from place to place: No  In the past year have you fallen or had a near fall?:No  Are you sexually active?  No answer  Do you have more than one partner? No   Hearing Difficulties: No  Do you often ask people to speak up or repeat themselves? No  Do you experience ringing or noises in your ears? No  Do you have difficulty understanding soft or whispered voices? No  Do you feel that you have a problem with memory? No Do you often misplace items? No    Home Safety:  Do you have a smoke alarm at your residence? Yes Do you have grab bars in the bathroom?  No answer Do you have throw rugs in your house? yes   Cognitive Testing  Alert? Yes Normal Appearance?Yes  Oriented to person? Yes Place? Yes  Time? Yes  Recall of three objects? Yes  Can perform simple calculations? Yes  Displays appropriate judgment?Yes  Can read the correct time from a watch face?Yes   List the Names of Other Physician/Practitioners you currently use:  See referral list for the physicians patient is currently seeing.     Review of Systems: See above   Objective:     General appearance: Appears stated age and mildly obese  Head: Normocephalic, without obvious abnormality, atraumatic  Eyes: conj clear, EOMi PEERLA  Ears: normal TM's and external ear canals both ears  Nose: Nares normal. Septum midline. Mucosa normal. No drainage or sinus tenderness.  Throat: lips, mucosa, and tongue normal; teeth and gums normal  Neck: no adenopathy, no carotid bruit, no JVD, supple, symmetrical, trachea midline and thyroid not enlarged, symmetric, no tenderness/mass/nodules  No CVA tenderness.  Lungs: clear to auscultation bilaterally  Breasts: normal appearance, no masses or tenderness,  top of the pacemaker on left upper chest. Incision well-healed. It is tender.  Heart: regular rate and rhythm, S1, S2 normal, no murmur, click, rub or gallop  Abdomen: soft, non-tender; bowel sounds normal; no masses, no organomegaly  Musculoskeletal: ROM normal in all joints, no crepitus, no deformity, Normal muscle strengthen. Back  is symmetric, no curvature. Skin: Skin color, texture, turgor normal. No rashes or lesions  Lymph nodes: Cervical, supraclavicular, and axillary nodes normal.  Neurologic: CN 2 -12 Normal, Normal symmetric reflexes. Normal coordination and gait  Psych: Alert & Oriented x 3, Mood appear stable.    Assessment:    Annual wellness medicare exam   Plan:    During the course of the visit the patient was educated and counseled about appropriate screening and preventive services including:   Annual flu vaccine recommended  We will need Prevnar 13 when he returns  Tetanus immunization due 2022  Has had 2 COVID-19 vaccines  Needs colonoscopy      Patient Instructions (the written plan) was given to the patient.  Medicare Attestation  I have personally reviewed:  The patient's medical and social history  Their use of alcohol, tobacco or illicit drugs  Their current medications and supplements  The patient's functional ability including ADLs,fall risks, home safety risks, cognitive, and hearing and visual impairment  Diet and physical activities  Evidence for depression or mood disorders  The patient's weight, height, BMI, and visual acuity have been recorded in the chart. I have made referrals, counseling, and provided education to the patient based on review of the above and I have provided the patient with a written personalized care plan for preventive services.

## 2020-05-22 ENCOUNTER — Encounter: Payer: Self-pay | Admitting: Internal Medicine

## 2020-06-15 ENCOUNTER — Encounter: Payer: Self-pay | Admitting: Physician Assistant

## 2020-06-15 ENCOUNTER — Ambulatory Visit (INDEPENDENT_AMBULATORY_CARE_PROVIDER_SITE_OTHER): Payer: Medicare Other

## 2020-06-15 ENCOUNTER — Ambulatory Visit (INDEPENDENT_AMBULATORY_CARE_PROVIDER_SITE_OTHER): Payer: Medicare Other | Admitting: Physician Assistant

## 2020-06-15 ENCOUNTER — Other Ambulatory Visit: Payer: Self-pay

## 2020-06-15 DIAGNOSIS — Z96641 Presence of right artificial hip joint: Secondary | ICD-10-CM

## 2020-06-15 DIAGNOSIS — M7711 Lateral epicondylitis, right elbow: Secondary | ICD-10-CM | POA: Diagnosis not present

## 2020-06-15 MED ORDER — LIDOCAINE HCL 1 % IJ SOLN
0.5000 mL | INTRAMUSCULAR | Status: AC | PRN
  Administered 2020-06-15: .5 mL

## 2020-06-15 MED ORDER — METHYLPREDNISOLONE ACETATE 40 MG/ML IJ SUSP
20.0000 mg | INTRAMUSCULAR | Status: AC | PRN
Start: 2020-06-15 — End: 2020-06-15
  Administered 2020-06-15: 20 mg

## 2020-06-15 NOTE — Progress Notes (Signed)
Office Visit Note   Patient: [name] [name]           Date of Birth: 1956-10-27           MRN: [id number] Visit Date: 06/15/2020              Requested by: Elby Showers, MD [address] [place],  [place] [id number] PCP: Elby Showers, MD   Assessment & Plan: Visit Diagnoses:  1. Status post total replacement of right hip   2. Lateral epicondylitis, right elbow     Plan: He will follow up with [place] on an as-needed basis.  Questions encouraged and answered at length.  Courage him to continue to work on range of motion strengthening right hip.  Reassurance was given that the left hip appears to only have some mild arthritic changes.  Follow-Up Instructions: Return if symptoms worsen or fail to improve.   Orders:  Orders Placed This Encounter  Procedures  . Hand/UE Inj  . XR HIP UNILAT W OR W/O PELVIS 2-3 VIEWS RIGHT   No orders of the defined types were placed in this encounter.     Procedures: Hand/UE Inj for lateral epicondylitis on 06/15/2020 2:27 PM Medications: 0.5 mL lidocaine 1 %; 20 mg methylPREDNISolone acetate 40 MG/ML Consent was given by the patient. Immediately prior to procedure a time out was called to verify the correct patient, procedure, equipment, support staff and site/side marked as required. Patient was prepped and draped in the usual sterile fashion.       Clinical Data: No additional findings.   Subjective: Chief Complaint  Patient presents with  . Right Hip - Routine Post Op  . Right Elbow - Injections    HPI Mr. Sandoz returns today status post right total hip arthroplasty 05/10/2019.  States he has occasional stabbing in the hip but overall is doing well.  He does have some trouble getting worse due to decreased range of motion right hip compared to left.  But he denies any pain when trying to get on or off a horse.  Left hip with occasional pain but no decreased range of motion.  He is also asking for a right elbow injection for  lateral epicondylitis has had this in the past last one being in October 2020 instead well until recently.  Review of Systems See HPI Objective: Vital Signs: There were no vitals taken for this visit.  Physical Exam Constitutional:      Appearance: He is not ill-appearing.  Neurological:     Mental Status: He is alert and oriented to person, place, and time.  Psychiatric:        Mood and Affect: Mood normal.     Ortho Exam Right hip good range of motion without pain.  Slight decreased motion with external rotation and also abduction compared to the left side. Right elbow good range of motion tenderness over the lateral epicondyle region.  No rashes skin lesions ulcerations of the right elbow no ecchymosis right elbow   Specialty Comments:  No specialty comments available.  Imaging: XR HIP UNILAT W OR W/O PELVIS 2-3 VIEWS RIGHT  Result Date: 06/15/2020 AP pelvis lateral view right hip: Well-seated right total hip arthroplasty components without any signs of complications.  No acute fractures.  Both hips well located.    PMFS History: Patient Active Problem List   Diagnosis Date Noted  . Status post total replacement of right hip 05/10/2019  . Unilateral primary osteoarthritis, right hip 01/24/2019  .  GERD (gastroesophageal reflux disease) 09/16/2013  . HTN (hypertension) 06/07/2011  . Irritable bowel 06/07/2011  . DIVERTICULITIS-COLON 11/27/2008   Past Medical History:  Diagnosis Date  . Anxiety    mild  . Arthritis    Rt hip  . Cancer ([place]) 04/2019   Skin Rt hand. Dr. Ronnald Ramp  . Diabetes mellitus without complication ([place])   . GERD (gastroesophageal reflux disease)    Tums or omeprozole  . History of IBS   . History of kidney stones    30 years ago  . Hypertension 2009   Meds controlled    Family History  Problem Relation Age of Onset  . Cancer Father     Past Surgical History:  Procedure Laterality Date  . COLONOSCOPY     has IBS  . renal stone    .  TOTAL HIP ARTHROPLASTY Right 05/10/2019   Procedure: RIGHT TOTAL HIP ARTHROPLASTY ANTERIOR APPROACH;  Surgeon: Mcarthur Rossetti, MD;  Location: [facility];  Service: Orthopedics;  Laterality: Right;   Social History   Occupational History  . Not on file  Tobacco Use  . Smoking status: Never Smoker  . Smokeless tobacco: Never Used  Vaping Use  . Vaping Use: Never used  Substance and Sexual Activity  . Alcohol use: Yes    Alcohol/week: 2.0 - 3.0 standard drinks    Types: 2 - 3 Shots of liquor per week  . Drug use: No  . Sexual activity: Yes

## 2020-06-18 ENCOUNTER — Other Ambulatory Visit: Payer: Self-pay | Admitting: Internal Medicine

## 2020-07-17 ENCOUNTER — Encounter: Payer: Self-pay | Admitting: Internal Medicine

## 2020-07-17 ENCOUNTER — Telehealth: Payer: Self-pay

## 2020-07-17 ENCOUNTER — Ambulatory Visit (INDEPENDENT_AMBULATORY_CARE_PROVIDER_SITE_OTHER): Payer: Medicare Other | Admitting: Internal Medicine

## 2020-07-17 ENCOUNTER — Other Ambulatory Visit: Payer: Self-pay

## 2020-07-17 VITALS — BP 120/80 | HR 63 | Ht 72.0 in | Wt 230.0 lb

## 2020-07-17 DIAGNOSIS — R05 Cough: Secondary | ICD-10-CM

## 2020-07-17 DIAGNOSIS — R059 Cough, unspecified: Secondary | ICD-10-CM

## 2020-07-17 MED ORDER — AZITHROMYCIN 250 MG PO TABS: ORAL_TABLET | ORAL | 1 refills | Status: DC

## 2020-07-17 NOTE — Telephone Encounter (Signed)
Patient called c/o head cold, slight cough, stuffy ears and sinus pressure no fever temperature this morning was 97.9, he would like to be seen he said he can come in or do a video visit. Symptoms began 2 weeks ago, he did 2 home COVID tests and both were negative. Had both covid vaccines in April.   Call back number [phone]

## 2020-07-17 NOTE — Telephone Encounter (Signed)
Booked at 2:15pm.

## 2020-07-17 NOTE — Progress Notes (Signed)
   Subjective:    Patient ID: [name] [name], male    DOB: 1956/11/27, 64 y.o.   MRN: [id number]  HPI Onset nasal congestion, stuffy ears, no sore throat about  10 days ago.  He has had 2 COVID-19 vaccines.  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  He plans to travel to [facility] this weekend for job purposes and when he returns he will be leaving for [place] to attend a [employer] event.  Denies shortness of breath or wheezing.  Did not eat a lot yesterday and feels very tired today.  Stays active riding his horse and doing outdoor activities such as mowing.  He took 2 COVID-19 home test that were negative recently.  He was seen in May for welcome to Medicare physical exam.  History of right hip arthroplasty May 2020.  History of essential hypertension, recurrent bouts of diverticulitis, irritable bowel syndrome, type 2 diabetes mellitus.  Review of Systems weakness and fatigue. Some exposure to individuals with respiratory symptoms. Fatigue but no headache.  Says his ears feel stopped up.     Objective:   Physical Exam Afebrile. BP 120/80, pulse 63, pulse ox 96% His pharynx is clear without exudate.  TMs are clear.  Neck is supple.  No adenopathy.  Chest clear to auscultation without rales or wheezing.  Alert and oriented in no acute distress.  Obtained respiratory virus panel nasal swab  Obtained COVID-19 nasal swab      Assessment & Plan:   Acute upper respiratory infection  Plan: Respiratory virus panel and COVID-19 swabs obtained with results pending.  Have prescribed Zithromax Z-PAK 2 p.o. day 1 followed by 1 p.o. days 2 through 5 with 1 refill.  Rest and drink plenty of fluids.  He has cough medication on hand should he need it.

## 2020-07-17 NOTE — Patient Instructions (Signed)
Take Zithromax Z-PAK 2 p.o. day 1 followed by 1 p.o. days 2 through 5.  Give him 1 refill on this medication.  Rest and drink plenty of fluids.  It was a pleasure to see you today.  Respiratory virus panel and COVID-19 testing are pending.

## 2020-07-17 NOTE — Telephone Encounter (Signed)
Schedule OV

## 2020-07-18 LAB — SARS-COV-2 RNA,(COVID-19) QUALITATIVE NAAT: SARS CoV2 RNA: DETECTED — AB

## 2020-07-20 ENCOUNTER — Telehealth: Payer: Self-pay | Admitting: Internal Medicine

## 2020-07-20 LAB — RESPIRATORY VIRUS PANEL

## 2020-07-20 NOTE — Telephone Encounter (Signed)
[facility] Department Fax [phone]  Faxed positive COVID results

## 2020-08-01 ENCOUNTER — Other Ambulatory Visit: Payer: Self-pay | Admitting: Internal Medicine

## 2020-08-03 ENCOUNTER — Other Ambulatory Visit: Payer: Self-pay | Admitting: Internal Medicine

## 2020-08-26 ENCOUNTER — Other Ambulatory Visit: Payer: Self-pay

## 2020-08-26 ENCOUNTER — Encounter: Payer: Self-pay | Admitting: Podiatry

## 2020-08-26 ENCOUNTER — Ambulatory Visit (INDEPENDENT_AMBULATORY_CARE_PROVIDER_SITE_OTHER): Payer: Medicare Other

## 2020-08-26 ENCOUNTER — Ambulatory Visit (INDEPENDENT_AMBULATORY_CARE_PROVIDER_SITE_OTHER): Payer: Medicare Other | Admitting: Podiatry

## 2020-08-26 DIAGNOSIS — M7662 Achilles tendinitis, left leg: Secondary | ICD-10-CM | POA: Diagnosis not present

## 2020-08-26 DIAGNOSIS — M722 Plantar fascial fibromatosis: Secondary | ICD-10-CM

## 2020-08-26 MED ORDER — METHYLPREDNISOLONE 4 MG PO TBPK
ORAL_TABLET | ORAL | 0 refills | Status: DC
Start: 2020-08-26 — End: 2020-10-26

## 2020-08-26 MED ORDER — MELOXICAM 15 MG PO TABS: 15.0000 mg | ORAL_TABLET | Freq: Every day | ORAL | 3 refills | Status: DC

## 2020-08-26 MED ORDER — NITROGLYCERIN 0.2 MG/HR TD PT24: 0.2000 mg | MEDICATED_PATCH | Freq: Every day | TRANSDERMAL | 12 refills | Status: DC

## 2020-08-26 NOTE — Progress Notes (Signed)
Subjective:  Patient ID: [name] [name], male    DOB: 06/08/1956,  MRN: [id number] HPI Chief Complaint  Patient presents with  . Foot Pain    Patient presents today for left heel pain x 6-7 months.  He says its bad in the mornings and when standing from sitting and its a sharp pain that comes and goes.  It gets worse by end of day.   He has been stretching and taking Advil for relief    64 y.o. male presents with the above complaint.   ROS: Denies fever chills nausea vomiting muscle aches pains calf pain back pain chest pain shortness of breath.  Past Medical History:  Diagnosis Date  . Anxiety    mild  . Arthritis    Rt hip  . Cancer ([place]) 04/2019   Skin Rt hand. Dr. Ronnald Ramp  . Diabetes mellitus without complication ([place])   . GERD (gastroesophageal reflux disease)    Tums or omeprozole  . History of IBS   . History of kidney stones    30 years ago  . Hypertension 2009   Meds controlled   Past Surgical History:  Procedure Laterality Date  . COLONOSCOPY     has IBS  . renal stone    . TOTAL HIP ARTHROPLASTY Right 05/10/2019   Procedure: RIGHT TOTAL HIP ARTHROPLASTY ANTERIOR APPROACH;  Surgeon: Mcarthur Rossetti, MD;  Location: [facility];  Service: Orthopedics;  Laterality: Right;    Current Outpatient Medications:  .  Accu-Chek FastClix Lancets MISC, USE TO TEST BLOOD GLUCOSE BEFORE BREAKFAST AND SUPPER, Disp: 102 each, Rfl: prn .  ACCU-CHEK GUIDE test strip, USE TO TEST BLOOD GLUCOSE BEFORE BREAKFAST AND SUPPER, Disp: 100 strip, Rfl: PRN .  albuterol (VENTOLIN HFA) 108 (90 Base) MCG/ACT inhaler, Inhale 2 puffs into the lungs every 6 (six) hours as needed for wheezing or shortness of breath., Disp: 8 g, Rfl: 11 .  ALPRAZolam (XANAX) 0.5 MG tablet, TAKE 1 TABLET BY MOUTH THREE TIMES A DAY, Disp: 90 tablet, Rfl: 1 .  glipiZIDE (GLUCOTROL) 5 MG tablet, TAKE 1 TABLET (5 MG TOTAL) BY MOUTH DAILY BEFORE BREAKFAST. DISCONTINUE GLIPIZIDE 2.5MG , Disp: 90 tablet, Rfl: 2 .   hydrochlorothiazide (HYDRODIURIL) 25 MG tablet, TAKE 1 TABLET BY MOUTH EVERY DAY WITH LOSARTAN, Disp: 90 tablet, Rfl: 1 .  Ibuprofen-Famotidine (DUEXIS) 800-26.6 MG TABS, One po 3 times a day with a meal as needed for musculoskeletal pain, Disp: 90 tablet, Rfl: 2 .  meloxicam (MOBIC) 15 MG tablet, Take 1 tablet (15 mg total) by mouth daily., Disp: 30 tablet, Rfl: 3 .  methylPREDNISolone (MEDROL DOSEPAK) 4 MG TBPK tablet, 6 day dose pack - take as directed, Disp: 21 tablet, Rfl: 0 .  nitroGLYCERIN (NITRO-DUR) 0.2 mg/hr patch, Place 1 patch (0.2 mg total) onto the skin daily., Disp: 30 patch, Rfl: 12 .  olmesartan (BENICAR) 20 MG tablet, TAKE 1 TABLET BY MOUTH EVERY DAY, Disp: 90 tablet, Rfl: 1  Allergies  Allergen Reactions  . Iohexol      Code: RASH, Desc: PER MARY SILER ([place] [facility])STATES PT HAD REACTION TO CONTRAST MANY YRS PRIOR. 01/05/09/RM, Onset Date: [id number]    Review of Systems Objective:  There were no vitals filed for this visit.  General: Well developed, nourished, in no acute distress, alert and oriented x3   Dermatological: Skin is warm, dry and supple bilateral. Nails x 10 are well maintained; remaining integument appears unremarkable at this time. There are no open sores, no preulcerative  lesions, no rash or signs of infection present.  Vascular: Dorsalis Pedis artery and Posterior Tibial artery pedal pulses are 2/4 bilateral with immedate capillary fill time. Pedal hair growth present. No varicosities and no lower extremity edema present bilateral.   Neruologic: Grossly intact via light touch bilateral. Vibratory intact via tuning fork bilateral. Protective threshold with Semmes Wienstein monofilament intact to all pedal sites bilateral. Patellar and Achilles deep tendon reflexes 2+ bilateral. No Babinski or clonus noted bilateral.   Musculoskeletal: No gross boney pedal deformities bilateral. No pain, crepitus, or limitation noted with foot and ankle range of motion  bilateral. Muscular strength 5/5 in all groups tested bilateral.  Pain on palpation of the Achilles tendon at its insertion site with fluctuance beneath the skin in the left heel posteriorly.  He has pain on palpation of this area pain on plantarflexion against resistance.  Gait: Unassisted, Nonantalgic.    Radiographs:  Radiographs taken today demonstrate a retrocalcaneal heel spur and thickening of the Achilles tendon mild thickening of the plantar fascia at the calcaneal insertion site no fractures are identified no acute findings noted.  Assessment & Plan:   Assessment: Insertional Achilles tendinitis bursitis left.   Plan: Discussed etiology pathology conservative versus surgical therapies at this point I started him on methylprednisolone to be followed by meloxicam.  Injected subcutaneously 2 mg of dexamethasone and local anesthetic at the point of maximal tenderness making sure not to inject into the tendon itself.  Also placed him in a plantar fascial night splint discussed icing and if he is not improved in 1 month an MRI will be necessary.  If there is improvement then we can try physical therapy.     Cristoval Teall T. Kramer, [place]

## 2020-09-30 ENCOUNTER — Encounter: Payer: Medicare Other | Admitting: Podiatry

## 2020-10-19 ENCOUNTER — Other Ambulatory Visit: Payer: Self-pay

## 2020-10-19 MED ORDER — IBUPROFEN-FAMOTIDINE 800-26.6 MG PO TABS: ORAL_TABLET | ORAL | 2 refills | Status: DC

## 2020-10-26 ENCOUNTER — Encounter: Payer: Self-pay | Admitting: Physician Assistant

## 2020-10-26 ENCOUNTER — Ambulatory Visit (INDEPENDENT_AMBULATORY_CARE_PROVIDER_SITE_OTHER): Payer: Medicare Other | Admitting: Physician Assistant

## 2020-10-26 DIAGNOSIS — M7712 Lateral epicondylitis, left elbow: Secondary | ICD-10-CM | POA: Diagnosis not present

## 2020-10-26 MED ORDER — METHYLPREDNISOLONE ACETATE 40 MG/ML IJ SUSP
20.0000 mg | INTRAMUSCULAR | Status: AC | PRN
  Administered 2020-10-26: 20 mg

## 2020-10-26 MED ORDER — LIDOCAINE HCL 1 % IJ SOLN
1.0000 mL | INTRAMUSCULAR | Status: AC | PRN
  Administered 2020-10-26: 1 mL

## 2020-10-26 NOTE — Progress Notes (Signed)
   Procedure Note  Patient: [name] [name]             Date of Birth: 12-21-1956           MRN: [id number]             Visit Date: 10/26/2020 HPI: [name] returns today for left elbow pain.  He is requesting injection in the elbow.  He said no new injury.  Wearing a brace.  Reviewed the elbow which seemed to help.  He has had no fevers chills.  Physical exam: Left elbow: Good range of motion left elbow.  No rashes skin lesions ulcerations erythema or edema of the left elbow.  Tenderness over the lateral epicondyle region.  Provocative maneuvers cause pain lateral elbow.  Procedures: Visit Diagnoses:  1. Lateral epicondylitis, left elbow     Hand/UE Inj: L elbow for lateral epicondylitis on 10/26/2020 1:38 PM Medications: 1 mL lidocaine 1 %; 20 mg methylPREDNISolone acetate 40 MG/ML Outcome: tolerated well, no immediate complications Consent was given by the parent. Immediately prior to procedure a time out was called to verify the correct patient, procedure, equipment, support staff and site/side marked as required. Patient was prepped and draped in the usual sterile fashion.    Plan: He will follow up with [place] as needed.  Reviewed stretching and lifting techniques with him for lateral epicondylitis.

## 2020-10-28 ENCOUNTER — Encounter: Payer: Medicare Other | Admitting: Podiatry

## 2020-11-02 ENCOUNTER — Encounter: Payer: Medicare Other | Admitting: Podiatry

## 2020-11-09 ENCOUNTER — Other Ambulatory Visit: Payer: Self-pay

## 2020-11-09 ENCOUNTER — Other Ambulatory Visit: Payer: Medicare Other | Admitting: Internal Medicine

## 2020-11-09 DIAGNOSIS — E119 Type 2 diabetes mellitus without complications: Secondary | ICD-10-CM

## 2020-11-09 DIAGNOSIS — E1169 Type 2 diabetes mellitus with other specified complication: Secondary | ICD-10-CM

## 2020-11-09 NOTE — Addendum Note (Signed)
Addended by: Mady Haagensen on: 11/09/2020 09:46 AM   Modules accepted: Orders

## 2020-11-10 ENCOUNTER — Encounter: Payer: Self-pay | Admitting: Internal Medicine

## 2020-11-10 ENCOUNTER — Ambulatory Visit (INDEPENDENT_AMBULATORY_CARE_PROVIDER_SITE_OTHER): Payer: Medicare Other | Admitting: Internal Medicine

## 2020-11-10 VITALS — BP 100/70 | HR 72 | Ht 72.0 in | Wt 231.0 lb

## 2020-11-10 DIAGNOSIS — Z6831 Body mass index (BMI) 31.0-31.9, adult: Secondary | ICD-10-CM

## 2020-11-10 DIAGNOSIS — K580 Irritable bowel syndrome with diarrhea: Secondary | ICD-10-CM | POA: Diagnosis not present

## 2020-11-10 DIAGNOSIS — I10 Essential (primary) hypertension: Secondary | ICD-10-CM | POA: Diagnosis not present

## 2020-11-10 DIAGNOSIS — Z23 Encounter for immunization: Secondary | ICD-10-CM

## 2020-11-10 DIAGNOSIS — Z8616 Personal history of COVID-19: Secondary | ICD-10-CM

## 2020-11-10 DIAGNOSIS — E1169 Type 2 diabetes mellitus with other specified complication: Secondary | ICD-10-CM

## 2020-11-10 DIAGNOSIS — Z8719 Personal history of other diseases of the digestive system: Secondary | ICD-10-CM | POA: Diagnosis not present

## 2020-11-10 DIAGNOSIS — Z8659 Personal history of other mental and behavioral disorders: Secondary | ICD-10-CM

## 2020-11-10 DIAGNOSIS — E782 Mixed hyperlipidemia: Secondary | ICD-10-CM

## 2020-11-10 LAB — HEMOGLOBIN A1C
Hgb A1c MFr Bld: 6.3 % of total Hgb — ABNORMAL HIGH (ref <5.7)
Mean Plasma Glucose: 134 (calc)
eAG (mmol/L): 7.4 (calc)

## 2020-11-10 LAB — SARS-COV-2 ANTIBODY(IGG)SPIKE,SEMI-QUANTITATIVE: SARS COV1 AB(IGG)SPIKE,SEMI QN: >150 index — ABNORMAL HIGH (ref <1.00)

## 2020-11-10 LAB — LIPID PANEL
Cholesterol: 212 mg/dL — ABNORMAL HIGH (ref <200)
HDL: 37 mg/dL — ABNORMAL LOW (ref >=40)
LDL Cholesterol (Calc): 142 mg/dL (calc) — ABNORMAL HIGH
Non-HDL Cholesterol (Calc): 175 mg/dL (calc) — ABNORMAL HIGH (ref <130)
Total CHOL/HDL Ratio: 5.7 (calc) — ABNORMAL HIGH (ref <5.0)
Triglycerides: 189 mg/dL — ABNORMAL HIGH (ref <150)

## 2020-11-10 MED ORDER — ALPRAZOLAM 0.5 MG PO TABS
ORAL_TABLET | ORAL | 5 refills | Status: DC
Start: 2020-11-10 — End: 2021-02-19

## 2020-11-10 MED ORDER — ROSUVASTATIN CALCIUM 5 MG PO TABS: 5.0000 mg | ORAL_TABLET | Freq: Every day | ORAL | 1 refills | Status: DC

## 2020-11-10 MED ORDER — GLIPIZIDE 10 MG PO TABS: 10.0000 mg | ORAL_TABLET | Freq: Every day | ORAL | 1 refills | Status: DC

## 2020-11-10 NOTE — Patient Instructions (Addendum)
Xanax refilled for 6 months. Flu vaccine given. Refer to Cardiology for Coronary risk assessment at his request. Increase Glipizide  to 10 mg daily. Start Crestor generic 5 mg daily. Follow up in late January. Try to walk for exercise and watch diet.

## 2020-11-10 NOTE — Progress Notes (Signed)
   Subjective:    Patient ID: [name] [name], male    DOB: October 24, 1956, 64 y.o.   MRN: [id number]  HPI 64 year old Male seen for 6 month recheck. Has had issues with left Achilles tendinitis that started over a year ago carrying bales of hay uphill at a horse show. Saw podiatrist in September  and was treated with injection of methylprednisolone  to be followed by meloxicam.  Also had left lateral epicondylitis seen by orthopedist.  Epicondyle was injected with methylprednisolone and lidocaine.  History of viral syndrome which is well controlled with Xanax 0.5 mg 3 times a day.  Prior to taking Xanax it was rather severe at times particularly in the mornings.  History of impaired glucose tolerance/type 2 diabetes mellitus.  Hemoglobin A1c has increased from 5.9% in May to 6.3%.  Not exercising as much due to Achilles tendinitis.  He is on glipizide and we are increasing it from 5 to 10 mg daily.  We will follow-up with this in January.  He is not on statin medication he probably should be with history of glucose intolerance/diabetes mellitus.  We are starting Crestor 5 mg daily.  Total cholesterol is 212, HDL is 37, triglycerides 189 and LDL cholesterol 142.  6 months ago triglycerides were 177 and LDL was 119 with normal total cholesterol and HDL of 44.  BMI is 31.3.  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  Some situational stress at work discussed.  He is asking about assessment for coronary artery disease.  He can be referred for cardiology consultation and if appropriate, coronary calcium score  Review of Systems see above     Objective:   Physical Exam Blood pressure 100/70 pulse 72 regular, pulse oximetry 96% weight 231 pounds BMI 31.33  Skin warm and dry.  No thyromegaly.  No carotid bruits.  Chest clear to auscultation.  Cardiac exam regular rate and rhythm normal S1 and S2 without murmurs or gallops.  No lower extremity pitting edema.       Assessment & Plan:    Hyperlipidemia-starting statin medication and follow-up in January.  He will be on Crestor 5 mg daily.  Musculoskeletal pain treated with ibuprofen/famotidine  History of anxiety.  Is on Xanax 3 times a day.  Mostly work-related stress.  Irritable bowel syndrome also treated with Xanax.  This is stable and is a longstanding issue with him.  Situational stress at work discussed  Essential hypertension treated with Benicar 20 mg daily and HCTZ daily.  Status post right hip arthroplasty  Type 2 diabetes mellitus-hemoglobin A1c 6.3%.  Needs to work on diet and get more exercise.  Is on glipizide and increasing dose from 5 to 10 mg daily.  History of COVID-19 in July 2021.  Recovered without complications.  History of recurrent bouts of diverticulitis but none recently  Plan: Cardiology consultation at patient's request to discuss coronary calcium scoring.  Starting Crestor 5 mg daily and he will follow-up here in January with regards to that with lipid panel and liver functions on statin medication.

## 2020-12-01 ENCOUNTER — Other Ambulatory Visit: Payer: Self-pay

## 2020-12-01 MED ORDER — HYDROCHLOROTHIAZIDE 25 MG PO TABS
ORAL_TABLET | ORAL | 1 refills | Status: DC
Start: 2020-12-01 — End: 2021-05-17

## 2020-12-01 MED ORDER — OLMESARTAN MEDOXOMIL 20 MG PO TABS
20.0000 mg | ORAL_TABLET | Freq: Every day | ORAL | 1 refills | Status: DC
Start: 2020-12-01 — End: 2021-03-08

## 2020-12-28 ENCOUNTER — Ambulatory Visit (INDEPENDENT_AMBULATORY_CARE_PROVIDER_SITE_OTHER): Payer: Medicare Other | Admitting: Podiatry

## 2020-12-28 ENCOUNTER — Encounter: Payer: Self-pay | Admitting: Podiatry

## 2020-12-28 ENCOUNTER — Ambulatory Visit (INDEPENDENT_AMBULATORY_CARE_PROVIDER_SITE_OTHER): Payer: Medicare Other

## 2020-12-28 ENCOUNTER — Other Ambulatory Visit: Payer: Self-pay

## 2020-12-28 DIAGNOSIS — M778 Other enthesopathies, not elsewhere classified: Secondary | ICD-10-CM

## 2020-12-28 MED ORDER — DEXAMETHASONE SODIUM PHOSPHATE 120 MG/30ML IJ SOLN
2.0000 mg | Freq: Once | INTRAMUSCULAR | Status: AC
  Administered 2020-12-28: 2 mg via INTRA_ARTICULAR

## 2020-12-28 NOTE — Progress Notes (Signed)
He presents today for follow-up of his Achilles tendinitis left which he states is doing pretty well.  He is more concerned about pain beneath the second metatarsophalangeal joint of the right foot.  States his been here for about 6 months denies any trauma.  States it hurts worse with his Work boots.  He does state that he is recently part put lifts in his shoes to help with the Achilles tendinitis.  Objective: Vital signs are stable he is alert oriented x3.  Pulses are palpable.  Much decrease in erythema in the posterior aspect of the Achilles.  He does have pain on end range of motion particularly plantarflexion of the second metatarsophalangeal joint of the right foot there is no palpable fluid within the joint itself.  Though he does have pain on palpation of the plantar aspect of the second metatarsophalangeal joint.  Radiographs taken today do not demonstrate any type of osseous abnormalities in this area other than elongated second metatarsal.  Assessment: Capsulitis second metatarsophalangeal joint of the right foot no hammertoe deformities noted.  Healing Achilles tendinitis left.  Plan: At this point recommend that he continue anti-inflammatories on a regular basis I injected plantar aspect of the foot today with 2 mg of dexamethasone local anesthetic we discussed appropriate shoe gear stretching exercises ice therapy and shoe gear modifications I will follow-up with [name] on an as-needed basis.

## 2021-01-01 NOTE — Progress Notes (Signed)
CARDIOLOGY CONSULT NOTE       Patient ID: [name] [name] MRN: [id number] DOB/AGE: [birth year] 65 y.o.  Admit date: (Not on file) Referring Physician: [place] Primary Physician: Elby Showers, MD Primary Cardiologist: New Reason for Consultation: CAD Risk  Active Problems:   * No active hospital problems. *   HPI:  65 y.o. referred by Dr Renold Genta to assess CAD risk. History of HLD just started on statin for LDL 142 in setting of type 2 DM with A1c 6.3 He has horses and works a barn Weight up as he is struggling with an achilles problem and has had a right hip replacement  He has significant anxiety and uses xanax I saw him back in 2012 and he had a normal ETT 01/03/12 Long standing history of murmur no w/u since child   COVID July 2021   He is active with more exertional dyspnea. Right foot and achilles has limited him and weight is up Also post right  THR. 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 Anxiety    mild  . Arthritis    Rt hip  . Cancer ([place]) 04/2019   Skin Rt hand. Dr. Ronnald Ramp  . Diabetes mellitus without complication ([place])   . GERD (gastroesophageal reflux disease)    Tums or omeprozole  . History of IBS   . History of kidney stones    30 years ago  . Hypertension 2009   Meds controlled    Family History  Problem Relation Age of Onset  . Cancer Father     Social History   Socioeconomic History  . Marital status: Divorced    Spouse name: Not on file  . Number of children: Not on file  . Years of education: Not on file  . Highest education level: Not on file  Occupational History  . Not on file  Tobacco Use  . Smoking status: Never Smoker  . Smokeless tobacco: Never Used  Vaping Use  . Vaping Use: Never used  Substance and  Sexual Activity  . Alcohol use: Yes    Alcohol/week: 2.0 - 3.0 standard drinks    Types: 2 - 3 Shots of liquor per week  . Drug use: No  . Sexual activity: Yes  Other Topics Concern  . Not on file  Social History Narrative  . Not on file   Social Determinants of Health   Financial Resource Strain: Not on file  Food Insecurity: Not on file  Transportation Needs: Not on file  Physical Activity: Not on file  Stress: Not on file  Social Connections: Not on file  Intimate Partner Violence: Not on file    Past Surgical History:  Procedure Laterality Date  . COLONOSCOPY     has IBS  . renal stone    . TOTAL HIP ARTHROPLASTY Right 05/10/2019   Procedure: RIGHT TOTAL HIP ARTHROPLASTY ANTERIOR APPROACH;  Surgeon: Mcarthur Rossetti, MD;  Location: [facility];  Service: Orthopedics;  Laterality: Right;      Current Outpatient Medications:  .  Accu-Chek FastClix Lancets MISC, USE TO TEST BLOOD GLUCOSE BEFORE BREAKFAST AND SUPPER, Disp: 102 each, Rfl: prn .  ACCU-CHEK GUIDE test strip, USE TO TEST BLOOD GLUCOSE BEFORE BREAKFAST AND SUPPER, Disp: 100 strip,  Rfl: PRN .  albuterol (VENTOLIN HFA) 108 (90 Base) MCG/ACT inhaler, Inhale 2 puffs into the lungs every 6 (six) hours as needed for wheezing or shortness of breath., Disp: 8 g, Rfl: 11 .  ALPRAZolam (XANAX) 0.5 MG tablet, TAKE 1 TABLET BY MOUTH THREE TIMES A DAY, Disp: 90 tablet, Rfl: 5 .  glipiZIDE (GLUCOTROL) 10 MG tablet, Take 1 tablet (10 mg total) by mouth daily before breakfast., Disp: 90 tablet, Rfl: 1 .  hydrochlorothiazide (HYDRODIURIL) 25 MG tablet, TAKE 1 TABLET BY MOUTH EVERY DAY WITH LOSARTAN, Disp: 90 tablet, Rfl: 1 .  Ibuprofen-Famotidine (DUEXIS) 800-26.6 MG TABS, One po 3 times a day with a meal as needed for musculoskeletal pain, Disp: 90 tablet, Rfl: 2 .  meloxicam (MOBIC) 15 MG tablet, Take 1 tablet (15 mg total) by mouth daily., Disp: 30 tablet, Rfl: 3 .  nitroGLYCERIN (NITRO-DUR) 0.2 mg/hr patch, Place 1 patch (0.2  mg total) onto the skin daily., Disp: 30 patch, Rfl: 12 .  olmesartan (BENICAR) 20 MG tablet, Take 1 tablet (20 mg total) by mouth daily., Disp: 90 tablet, Rfl: 1 .  rosuvastatin (CRESTOR) 5 MG tablet, Take 1 tablet (5 mg total) by mouth daily., Disp: 90 tablet, Rfl: 1    Physical Exam: There were no vitals taken for this visit.   Affect appropriate Healthy:  appears stated age 65: normal Neck supple with no adenopathy JVP normal no bruits no thyromegaly Lungs clear with no wheezing and good diaphragmatic motion Heart:  S1/S2 2/6 SEM murmur, no rub, gallop or click PMI normal Abdomen: benighn, BS positve, no tenderness, no AAA no bruit.  No HSM or HJR Distal pulses intact with no bruits No edema Neuro non-focal Skin warm and dry No muscular weakness Post right THR   Labs:   Lab Results  Component Value Date   WBC 3.4 (L) 05/11/2020   HGB 14.8 05/11/2020   HCT 43.0 05/11/2020   MCV 95.6 05/11/2020   PLT 171 05/11/2020   No results for input(s): NA, K, CL, CO2, BUN, CREATININE, CALCIUM, PROT, BILITOT, ALKPHOS, ALT, AST, GLUCOSE in the last 168 hours.  Invalid input(s): LABALBU No results found for: CKTOTAL, CKMB, CKMBINDEX, TROPONINI  Lab Results  Component Value Date   CHOL 212 (H) 11/09/2020   CHOL 173 05/11/2020   CHOL 201 (H) 05/07/2019   Lab Results  Component Value Date   HDL 37 (L) 11/09/2020   HDL 44 05/11/2020   HDL 45 05/07/2019   Lab Results  Component Value Date   LDLCALC 142 (H) 11/09/2020   LDLCALC 102 (H) 05/11/2020   LDLCALC 127 (H) 05/07/2019   Lab Results  Component Value Date   TRIG 189 (H) 11/09/2020   TRIG 171 (H) 05/11/2020   TRIG 170 (H) 05/07/2019   Lab Results  Component Value Date   CHOLHDL 5.7 (H) 11/09/2020   CHOLHDL 3.9 05/11/2020   CHOLHDL 4.5 05/07/2019   No results found for: LDLDIRECT    Radiology: No results found.  EKG: 05/15/20 NSR non specific ST changes    ASSESSMENT AND PLAN:   1. CAD Risk:   Discussed utility of coronary calcium score and cardiac CTA given dyspnea as a possible anginal equivalent in diabetic ? Contrast allergy will pre medicate with prednisone/benedryl/pepcid 2. DM:: continue glipizide  3. HLD:  F/u labs Dr Renold Genta on statin now 4. Ortho:  F/u for achilles tendinitis using meloxicam 5. Anxiety PRN xanax 6. Murmur: long standing SEM TTE r/o bicuspid AV  BMET Calcium Score with coronary CTA   F/U in a year   Signed: Jenkins Rouge 01/01/2021, 10:42 AM

## 2021-01-05 ENCOUNTER — Ambulatory Visit (INDEPENDENT_AMBULATORY_CARE_PROVIDER_SITE_OTHER): Payer: Medicare Other | Admitting: Cardiovascular Disease

## 2021-01-05 ENCOUNTER — Other Ambulatory Visit: Payer: Self-pay

## 2021-01-05 ENCOUNTER — Encounter: Payer: Self-pay | Admitting: Cardiovascular Disease

## 2021-01-05 VITALS — BP 120/78 | HR 75 | Ht 72.0 in | Wt 232.0 lb

## 2021-01-05 DIAGNOSIS — R079 Chest pain, unspecified: Secondary | ICD-10-CM | POA: Diagnosis not present

## 2021-01-05 DIAGNOSIS — R0600 Dyspnea, unspecified: Secondary | ICD-10-CM

## 2021-01-05 DIAGNOSIS — R011 Cardiac murmur, unspecified: Secondary | ICD-10-CM

## 2021-01-05 DIAGNOSIS — R9431 Abnormal electrocardiogram [ECG] [EKG]: Secondary | ICD-10-CM

## 2021-01-05 DIAGNOSIS — R931 Abnormal findings on diagnostic imaging of heart and coronary circulation: Secondary | ICD-10-CM

## 2021-01-05 DIAGNOSIS — R0609 Other forms of dyspnea: Secondary | ICD-10-CM

## 2021-01-05 MED ORDER — PREDNISONE 50 MG PO TABS: ORAL_TABLET | ORAL | 0 refills | Status: DC

## 2021-01-05 MED ORDER — DIPHENHYDRAMINE HCL 50 MG PO TABS
ORAL_TABLET | ORAL | 0 refills | Status: DC
Start: 2021-01-05 — End: 2021-04-12

## 2021-01-05 NOTE — Patient Instructions (Signed)
Medication Instructions:  *If you need a refill on your cardiac medications before your next appointment, please call your pharmacy*  Lab Work: Your physician recommends that you have lab work today- BMET  If you have labs (blood work) drawn today and your tests are completely normal, you will receive your results only by: Marland Kitchen MyChart Message (if you have MyChart) OR . A paper copy in the mail If you have any lab test that is abnormal or we need to change your treatment, we will call you to review the results.   Testing/Procedures: Your physician has requested that you have an echocardiogram. Echocardiography is a painless test that uses sound waves to create images of your heart. It provides your doctor with information about the size and shape of your heart and how well your heart's chambers and valves are working. This procedure takes approximately one hour. There are no restrictions for this procedure.  Your physician has requested that you have cardiac CT. Cardiac computed tomography (CT) is a painless test that uses an x-ray machine to take clear, detailed pictures of your heart. For further information please visit [email or web address].[email or web address]. Please follow instruction sheet as given.  Follow-Up: At [facility], you and your health needs are our priority.  As part of our continuing mission to provide you with exceptional heart care, we have created designated Provider Care Teams.  These Care Teams include your primary Cardiologist (physician) and Advanced Practice Providers (APPs -  Physician Assistants and Nurse Practitioners) who all work together to provide you with the care you need, when you need it.  We recommend signing up for the patient portal called "MyChart".  Sign up information is provided on this After Visit Summary.  MyChart is used to connect with patients for Virtual Visits (Telemedicine).  Patients are able to view lab/test results, encounter notes, upcoming appointments, etc.   Non-urgent messages can be sent to your provider as well.   To learn more about what you can do with MyChart, go to [email or web address].    Your next appointment:   1 year(s)  The format for your next appointment:   In Person  Provider:   You may see Dr. Johnsie Cancel or one of the following Advanced Practice Providers on your designated Care Team:    Truitt Merle, NP  Cecilie Kicks, NP  Kathyrn Drown, NP    Other Instructions Your cardiac CT will be scheduled at one of the below locations:   [facility] [address] [place], [place] [address] [phone]   If scheduled at [facility], please arrive at the [facility] main entrance of [facility] 30 minutes prior to test start time. Proceed to the [facility] Radiology Department (first floor) to check-in and test prep.   Please follow these instructions carefully (unless otherwise directed):  Hold all erectile dysfunction medications at least 3 days (72 hrs) prior to test.  On the Night Before the Test: . Be sure to Drink plenty of water. . Do not consume any caffeinated/decaffeinated beverages or chocolate 12 hours prior to your test. . Do not take any antihistamines 12 hours prior to your test. . If the patient has contrast allergy: ? Patient will need a prescription for Prednisone and very clear instructions (as follows): 1. Prednisone 50 mg - take 13 hours prior to test 2. Take another Prednisone 50 mg 7 hours prior to test 3. Take another Prednisone 50 mg 1 hour prior to test 4. Take Benadryl 50  mg 1 hour prior to test . Patient must complete all four doses of above prophylactic medications. . Patient will need a ride after test due to Benadryl.  On the Day of the Test: . Drink plenty of water. Do not drink any water within one hour of the test. . Do not eat any food 4 hours prior to the test. . You may take your regular medications prior to the test.  . Take metoprolol  (Lopressor) 100 mg two hours prior to test. . HOLD Hydrochlorothiazide morning of the test.      After the Test: . Drink plenty of water. . After receiving IV contrast, you may experience a mild flushed feeling. This is normal. . On occasion, you may experience a mild rash up to 24 hours after the test. This is not dangerous. If this occurs, you can take Benadryl 25 mg and increase your fluid intake. . If you experience trouble breathing, this can be serious. If it is severe call 911 IMMEDIATELY. If it is mild, please call our office. . If you take any of these medications: Glipizide please do not take 48 hours after completing test unless otherwise instructed.   Once we have confirmed authorization from your insurance company, we will call you to set up a date and time for your test. Based on how quickly your insurance processes prior authorizations requests, please allow up to 4 weeks to be contacted for scheduling your Cardiac CT appointment. Be advised that routine Cardiac CT appointments could be scheduled as many as 8 weeks after your provider has ordered it.  For non-scheduling related questions, please contact the cardiac imaging nurse navigator should you have any questions/concerns: Marchia Bond, Cardiac Imaging Nurse Navigator Burley Saver, Interim Cardiac Imaging Nurse [facility] Direct Office Dial: [phone]   For scheduling needs, including cancellations and rescheduling, please call [place], [phone].

## 2021-01-06 LAB — BASIC METABOLIC PANEL
BUN/Creatinine Ratio: 19 (ref 10–24)
BUN: 21 mg/dL (ref 8–27)
CO2: 24 mmol/L (ref 20–29)
Calcium: 10 mg/dL (ref 8.6–10.2)
Chloride: 102 mmol/L (ref 96–106)
Creatinine, Ser: 1.11 mg/dL (ref 0.76–1.27)
GFR calc Af Amer: 80 mL/min/{1.73_m2} (ref >59)
GFR calc non Af Amer: 69 mL/min/{1.73_m2} (ref >59)
Glucose: 119 mg/dL — ABNORMAL HIGH (ref 65–99)
Potassium: 4.3 mmol/L (ref 3.5–5.2)
Sodium: 144 mmol/L (ref 134–144)

## 2021-01-07 ENCOUNTER — Other Ambulatory Visit: Payer: Medicare Other | Admitting: Internal Medicine

## 2021-01-19 ENCOUNTER — Other Ambulatory Visit: Payer: Self-pay | Admitting: Internal Medicine

## 2021-01-19 ENCOUNTER — Other Ambulatory Visit: Payer: Medicare Other | Admitting: Internal Medicine

## 2021-01-21 ENCOUNTER — Other Ambulatory Visit: Payer: Medicare Other

## 2021-02-02 ENCOUNTER — Ambulatory Visit (INDEPENDENT_AMBULATORY_CARE_PROVIDER_SITE_OTHER): Payer: Medicare Other

## 2021-02-02 ENCOUNTER — Other Ambulatory Visit: Payer: Self-pay

## 2021-02-02 DIAGNOSIS — R9431 Abnormal electrocardiogram [ECG] [EKG]: Secondary | ICD-10-CM

## 2021-02-02 DIAGNOSIS — R0600 Dyspnea, unspecified: Secondary | ICD-10-CM

## 2021-02-02 DIAGNOSIS — R0609 Other forms of dyspnea: Secondary | ICD-10-CM

## 2021-02-02 DIAGNOSIS — R011 Cardiac murmur, unspecified: Secondary | ICD-10-CM

## 2021-02-02 DIAGNOSIS — R079 Chest pain, unspecified: Secondary | ICD-10-CM

## 2021-02-02 LAB — ECHOCARDIOGRAM COMPLETE
AR max vel: 1.21 cm2
AV Area VTI: 1.25 cm2
AV Area mean vel: 1.48 cm2
AV Mean grad: 10 mmHg
AV Peak grad: 23.4 mmHg
Ao pk vel: 2.42 m/s
Area-P 1/2: 2.76 cm2
Calc EF: 59.2 %
S' Lateral: 3.2 cm
Single Plane A2C EF: 55.8 %
Single Plane A4C EF: 62.1 %

## 2021-02-03 ENCOUNTER — Telehealth: Payer: Self-pay | Admitting: Cardiovascular Disease

## 2021-02-03 DIAGNOSIS — I350 Nonrheumatic aortic (valve) stenosis: Secondary | ICD-10-CM

## 2021-02-03 DIAGNOSIS — I340 Nonrheumatic mitral (valve) insufficiency: Secondary | ICD-10-CM

## 2021-02-03 NOTE — Telephone Encounter (Signed)
Patient returning call for echo results. 

## 2021-02-03 NOTE — Telephone Encounter (Signed)
Patient aware of results. Will place order for echo to be done in one year.

## 2021-02-08 ENCOUNTER — Telehealth: Payer: Self-pay

## 2021-02-08 ENCOUNTER — Other Ambulatory Visit: Payer: Self-pay

## 2021-02-08 ENCOUNTER — Encounter: Payer: Self-pay | Admitting: Internal Medicine

## 2021-02-08 ENCOUNTER — Ambulatory Visit (INDEPENDENT_AMBULATORY_CARE_PROVIDER_SITE_OTHER): Payer: Medicare Other | Admitting: Internal Medicine

## 2021-02-08 ENCOUNTER — Other Ambulatory Visit: Payer: Self-pay | Admitting: Internal Medicine

## 2021-02-08 VITALS — BP 100/80 | HR 74 | Temp 98.0°F | Ht 72.0 in | Wt 227.0 lb

## 2021-02-08 DIAGNOSIS — E119 Type 2 diabetes mellitus without complications: Secondary | ICD-10-CM

## 2021-02-08 DIAGNOSIS — I340 Nonrheumatic mitral (valve) insufficiency: Secondary | ICD-10-CM

## 2021-02-08 DIAGNOSIS — K5792 Diverticulitis of intestine, part unspecified, without perforation or abscess without bleeding: Secondary | ICD-10-CM | POA: Diagnosis not present

## 2021-02-08 DIAGNOSIS — I350 Nonrheumatic aortic (valve) stenosis: Secondary | ICD-10-CM

## 2021-02-08 DIAGNOSIS — R1030 Lower abdominal pain, unspecified: Secondary | ICD-10-CM

## 2021-02-08 DIAGNOSIS — I10 Essential (primary) hypertension: Secondary | ICD-10-CM

## 2021-02-08 LAB — POCT GLUCOSE (DEVICE FOR HOME USE): POC Glucose: 180 mg/dl — AB (ref 70–99)

## 2021-02-08 MED ORDER — CIPROFLOXACIN HCL 500 MG PO TABS: 500.0000 mg | ORAL_TABLET | Freq: Two times a day (BID) | ORAL | 1 refills | Status: DC

## 2021-02-08 MED ORDER — CIPROFLOXACIN HCL 500 MG PO TABS: 500.0000 mg | ORAL_TABLET | Freq: Two times a day (BID) | ORAL | 0 refills | Status: DC

## 2021-02-08 MED ORDER — METRONIDAZOLE 500 MG PO TABS: 500.0000 mg | ORAL_TABLET | Freq: Three times a day (TID) | ORAL | 1 refills | Status: DC

## 2021-02-08 MED ORDER — CEFTRIAXONE SODIUM 1 G IJ SOLR
1.0000 g | Freq: Once | INTRAMUSCULAR | Status: AC
  Administered 2021-02-08: 1 g via INTRAMUSCULAR

## 2021-02-08 NOTE — Telephone Encounter (Signed)
Yes. Will need CBC with diff and OV

## 2021-02-08 NOTE — Telephone Encounter (Signed)
Patient has been dealing with diverticulitis and has been out of his medicine for a over a week, he has lower abd pain. He said he feels like he needs to come in to be seen. His temperature is 97.7 Okay to book him an appt?

## 2021-02-08 NOTE — Telephone Encounter (Signed)
Can you discard this I have already sent it to [facility]. It won't let me get rid of it.

## 2021-02-08 NOTE — Progress Notes (Signed)
° °  Subjective:    Patient ID: [name] [name], male    DOB: 04-Jan-1956, 65 y.o.   MRN: [id number]  HPI 65 year old Male with history of cardiac murmur recently had echocardiogram in February showing mild mitral regurgitation and mild aortic stenosis.Marland Kitchen  He is basically asymptomatic with these.  He has a history of diverticulitis but no recent episodes until traveling recently.  He ate a large wedge salad with steak in [place] recently and symptoms started.  He has left lower quadrant abdominal pain.  Denies fever or shaking chills nausea or vomiting. He started Cipro and Flagyl but will be out soon.  Does not feel that recent glipizide 10 mg daily generic preparation is doing as well to control his glucose as his previous generic glipizide preparation of 5 mg daily.  Explained that infection could raise glucose.  Accu-Chek today is 189 fasting.  He had a cinnamon roll this morning.  We can ask pharmacy to see if they can change 10 mg glipizide to  another manufacturer.  Had planned a trip to [place] in a couple of days but neither of [place] is  Sure if this is a good idea with acute diverticulitis.  He will see how he is feeling tomorrow.  CBC with differential drawn and results show normal white blood cell count is 7400, hemoglobin 16.3 g and MCV 93.6.  He started Cipro a number of days ago when he first felt symptoms but currently is not on antibiotics.  Remote history of diverticulitis last episode on file December 2019.  History of irritable bowel syndrome treated with Xanax.  History of hypertension.  History of right hip arthroplasty by Dr. Ninfa Linden May 2020.  Last colonoscopy was 2005 showing diverticulosis.  Referral will be made to  GI.    Review of Systems see above-no nausea or vomiting.     Objective:   Physical Exam Blood pressure 100/80 pulse 74 temperature 98 degrees orally pulse oximetry 95% weight 227 pounds height 6 feet 0 inches BMI 30.79  Skin warm and dry.   Chest is clear to auscultation.  Abdomen is soft nondistended without hepatosplenomegaly or masses.  He has left lower quadrant tenderness without rebound tenderness today.Cor: RRR II/VI SEM.       Assessment & Plan:  Acute left lower quadrant abdominal pain-likely has recurrent diverticulitis.  Start on Cipro 500 mg twice daily for 10 days and Flagyl 500 mg 3 times a day for 7 days. Given one gram IM Rocephin.Past due for colonoscopy.  Health maintenance-referral for colonoscopy once diverticulitis is cleared up.  Will need at least 2 to 3 weeks to recover.  Type 2 diabetes mellitus-see if they will change brand of glipizide 10 mg to another manufacturer.  Accu-Chek elevated today at 180.  CBC with differential and hemoglobin A1c pending.  Addendum: White blood cell count is 7400.  History of aortic stenosis and mitral regurgitation followed by Dr. Johnsie Cancel.  Health maintenance - due for repeat colonoscopy  Plan: See above regarding treatment with Cipro and Flagyl.  Needs to take it easy.  Stay with clear liquids and advance diet slowly.  Referral for colonoscopy once diverticulitis has improved significantly.  Patient will continue to monitor Accu-Cheks and watch diet.   Addendum Feb 09, 2021: feeling some better. Need for colonoscopy discussed. Labs reviewed with patient. Says pharmacy looking for another brand for 10 mg Glipizide.Has CPE appointment late May.

## 2021-02-08 NOTE — Telephone Encounter (Signed)
Scheduled

## 2021-02-08 NOTE — Patient Instructions (Addendum)
Ask pharmacy to give him another brand of Glipizide 10 mg daily as he thinks this generic brand is not well absorbed. One gram IM Rocephin given.  Cipro 500 mg twice daily for 10 days.  Flagyl 500 mg 3 times a day for 7 days.  We will need colonoscopy once diverticulitis is cleared up in a few weeks.  Watch Accu-Cheks.  Past due for colonoscopy

## 2021-02-08 NOTE — Telephone Encounter (Signed)
Please call patient. Cipro is on back order at his pharmacy. I need another pharmacy. They want me to substitute something else and I don't want to do that. Let him know this.

## 2021-02-09 LAB — CBC WITH DIFFERENTIAL/PLATELET
Absolute Monocytes: 570 cells/uL (ref 200–950)
Basophils Absolute: 30 cells/uL (ref 0–200)
Basophils Relative: 0.4 %
Eosinophils Absolute: 89 cells/uL (ref 15–500)
Eosinophils Relative: 1.2 %
HCT: 45.7 % (ref 38.5–50.0)
Hemoglobin: 16.3 g/dL (ref 13.2–17.1)
Lymphs Abs: 2790 cells/uL (ref 850–3900)
MCH: 33.4 pg — ABNORMAL HIGH (ref 27.0–33.0)
MCHC: 35.7 g/dL (ref 32.0–36.0)
MCV: 93.6 fL (ref 80.0–100.0)
MPV: 9.1 fL (ref 7.5–12.5)
Monocytes Relative: 7.7 %
Neutro Abs: 3922 cells/uL (ref 1500–7800)
Neutrophils Relative %: 53 %
Platelets: 271 10*3/uL (ref 140–400)
RBC: 4.88 10*6/uL (ref 4.20–5.80)
RDW: 12.1 % (ref 11.0–15.0)
Total Lymphocyte: 37.7 %
WBC: 7.4 10*3/uL (ref 3.8–10.8)

## 2021-02-09 LAB — HEMOGLOBIN A1C
Hgb A1c MFr Bld: 6.6 % of total Hgb — ABNORMAL HIGH (ref <5.7)
Mean Plasma Glucose: 143 mg/dL
eAG (mmol/L): 7.9 mmol/L

## 2021-02-16 ENCOUNTER — Telehealth: Payer: Self-pay | Admitting: Internal Medicine

## 2021-02-16 NOTE — Telephone Encounter (Signed)
Ok to arrange.

## 2021-02-16 NOTE — Telephone Encounter (Signed)
[name] [name] [phone]  [name] called to say he is having a Cardiac CT 03/11/2021 and he needs BMP labs done within 30 days of that and he would like to come and have that done here if possible.

## 2021-02-17 NOTE — Telephone Encounter (Signed)
Scheduled

## 2021-02-19 ENCOUNTER — Other Ambulatory Visit: Payer: Medicare Other | Admitting: Internal Medicine

## 2021-02-19 ENCOUNTER — Other Ambulatory Visit: Payer: Self-pay | Admitting: Internal Medicine

## 2021-02-19 ENCOUNTER — Other Ambulatory Visit: Payer: Self-pay

## 2021-02-19 DIAGNOSIS — R0600 Dyspnea, unspecified: Secondary | ICD-10-CM

## 2021-02-19 DIAGNOSIS — R079 Chest pain, unspecified: Secondary | ICD-10-CM

## 2021-02-19 MED ORDER — ALPRAZOLAM 0.5 MG PO TABS: ORAL_TABLET | ORAL | 5 refills | Status: DC

## 2021-02-19 MED ORDER — METRONIDAZOLE 500 MG PO TABS: 500.0000 mg | ORAL_TABLET | Freq: Three times a day (TID) | ORAL | 0 refills | Status: DC

## 2021-02-19 NOTE — Telephone Encounter (Signed)
Refills pended. We sent CIPRO but not flagyl.

## 2021-02-19 NOTE — Telephone Encounter (Signed)
[name] [name] [phone]   [name] would like for you to send below medication in he is out and he wants to be sure and have some when he is traveling.   metroNIDAZOLE (FLAGYL) 500 MG tablet  He also needs   ALPRAZolam [name]) 0.5 MG tablet   [id number] [name], [place] - [place] Phone:  [phone]  Fax:  [phone]

## 2021-02-19 NOTE — Addendum Note (Signed)
Addended by: Mady Haagensen on: 02/19/2021 03:15 PM   Modules accepted: Orders

## 2021-02-19 NOTE — Telephone Encounter (Signed)
Pens and I will sign. I thought he had refills please check

## 2021-02-20 LAB — BASIC METABOLIC PANEL
BUN: 15 mg/dL (ref 7–25)
CO2: 29 mmol/L (ref 20–32)
Calcium: 9.9 mg/dL (ref 8.6–10.3)
Chloride: 102 mmol/L (ref 98–110)
Creat: 1 mg/dL (ref 0.70–1.25)
Glucose, Bld: 141 mg/dL — ABNORMAL HIGH (ref 65–139)
Potassium: 4.4 mmol/L (ref 3.5–5.3)
Sodium: 140 mmol/L (ref 135–146)

## 2021-02-21 ENCOUNTER — Other Ambulatory Visit: Payer: Self-pay | Admitting: Internal Medicine

## 2021-03-08 ENCOUNTER — Other Ambulatory Visit: Payer: Self-pay

## 2021-03-08 ENCOUNTER — Ambulatory Visit (HOSPITAL_COMMUNITY): Payer: Medicare Other

## 2021-03-08 ENCOUNTER — Telehealth: Payer: Self-pay | Admitting: Internal Medicine

## 2021-03-08 ENCOUNTER — Other Ambulatory Visit: Payer: Self-pay | Admitting: Internal Medicine

## 2021-03-08 ENCOUNTER — Ambulatory Visit (INDEPENDENT_AMBULATORY_CARE_PROVIDER_SITE_OTHER): Payer: Medicare Other | Admitting: Internal Medicine

## 2021-03-08 ENCOUNTER — Encounter: Payer: Self-pay | Admitting: Internal Medicine

## 2021-03-08 VITALS — HR 78 | Temp 98.0°F

## 2021-03-08 DIAGNOSIS — J22 Unspecified acute lower respiratory infection: Secondary | ICD-10-CM | POA: Diagnosis not present

## 2021-03-08 DIAGNOSIS — E119 Type 2 diabetes mellitus without complications: Secondary | ICD-10-CM | POA: Diagnosis not present

## 2021-03-08 MED ORDER — AZITHROMYCIN 250 MG PO TABS: ORAL_TABLET | ORAL | 0 refills | Status: DC

## 2021-03-08 MED ORDER — PREDNISONE 10 MG PO TABS: ORAL_TABLET | ORAL | 0 refills | Status: DC

## 2021-03-08 MED ORDER — HYDROCODONE-HOMATROPINE 5-1.5 MG/5ML PO SYRP: 5.0000 mL | ORAL_SOLUTION | Freq: Three times a day (TID) | ORAL | 0 refills | Status: DC | PRN

## 2021-03-08 MED ORDER — GLIPIZIDE 5 MG PO TABS: ORAL_TABLET | ORAL | 3 refills | Status: DC

## 2021-03-08 NOTE — Telephone Encounter (Signed)
Car visit

## 2021-03-08 NOTE — Patient Instructions (Signed)
Use inhaler for wheezing.  Take prednisone in tapering course as directed starting with 6 tablets day 1 and decreasing by 10 mg daily i.e. 6-5-4-3-2-1 taper.  Zithromax Z-PAK 2 tabs day 1 followed by 1 tab days 2 through 5.  Hycodan 1 teaspoon every 8 hours as needed for cough.  Glipizide 5 mg to take 2 tabs twice daily for type 2 diabetes mellitus.  Rest and drink fluids.  Monitor Accu-Cheks.  Respiratory virus panel obtained.  Cancel coronary CT which has been ordered for later this week and reschedule when better.

## 2021-03-08 NOTE — Progress Notes (Signed)
   Subjective:    Patient ID: [name] [name], male    DOB: 01-31-1956, 65 y.o.   MRN: [id number]  HPI Patient recently traveled to [place] to a horse show. 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 Worker had a severe respiratory infection. Patient then drove back to [place] to the horse show and came down with a severe respiratory infection which has persisted since he returned home several days ago. Has been resting at home. Has cough and congestion. Has prior hx of Covid-19 and has had had at least 2 Covid vaccines.Had flu vaccine in November 2021.  He has DM and recently we increased Glipizide to 10 mg daily but pharmacy did not have 10 mg dosage. Has been taking 2 x 5 mg tabs and accuchecks have improved. Pharmacy still does not have 10 mg dose so he says insurance company will pay for 2x 5 mg dose of Glipizide. Will send in Rx today.  Looks fatigued and is coughing.   He has a history of mild aortic stenosis, history of recurrent diverticulitis, hypertriglyceridemia, essential hypertension, mild mitral regurgitation.    Review of Systems no vomiting. Looks hydrated. Has been wheezing- advised to use inhaler.     Objective:   Physical Exam  Vital signs reviewed Sounds nasally congested. TMs clear. Neck supple. Chest is clear without rales or wheezing.  Respiratory virus panel obtained from nostril.     Assessment & Plan:  Acute lower respiratory infection  Type 2 diabetes mellitus- improved accuchecks with Glipizide 10 mg twice a day.  Plan: Send a new prescription for glipizide 5 mg to be taken 2 tabs twice daily.  Continue to monitor Accu-Cheks.  Zithromax Z-PAK 2 tabs day 1 followed by 1 tab days 2 through 5.  Hycodan 1 teaspoon every 8 hours as needed for cough.  Take prednisone in tapering course starting with 6 tablets day 1 and decreasing by 10 mg daily i.e. 6-5-4-3-2-1 taper.  Okay to postpone appointment for coronary calcium testing  since he is ill.  Respiratory virus panel obtained.

## 2021-03-08 NOTE — Telephone Encounter (Signed)
Scheduled

## 2021-03-08 NOTE — Telephone Encounter (Signed)
[name] [name] [phone]  [place] called to say he got sick last week on Monday or Tuesday while at a horse show, Sore throat cough and head congestion, he went on Tuesday or Wednesday and got a COVID test and It was negative. He is back and is coughing up green and yellow stuff and it seems to be in his chest, lot of chest congestion worried about pneumonia. Has had COVID vaccines and booster.

## 2021-03-10 ENCOUNTER — Encounter (HOSPITAL_COMMUNITY): Payer: Self-pay

## 2021-03-10 ENCOUNTER — Telehealth (HOSPITAL_COMMUNITY): Payer: Self-pay | Admitting: *Deleted

## 2021-03-10 ENCOUNTER — Other Ambulatory Visit: Payer: Self-pay

## 2021-03-10 LAB — RESPIRATORY VIRUS PANEL
Adenovirus B: NOT DETECTED
HUMAN PARAINFLU VIRUS 1: NOT DETECTED
HUMAN PARAINFLU VIRUS 2: NOT DETECTED
HUMAN PARAINFLU VIRUS 3: NOT DETECTED
INFLUENZA A SUBTYPE H1: NOT DETECTED
INFLUENZA A SUBTYPE H3: NOT DETECTED
Influenza A: NOT DETECTED
Influenza B: NOT DETECTED
Metapneumovirus: NOT DETECTED
Respiratory Syncytial Virus A: NOT DETECTED
Respiratory Syncytial Virus B: NOT DETECTED
Rhinovirus: DETECTED — AB

## 2021-03-10 MED ORDER — METOPROLOL TARTRATE 100 MG PO TABS: ORAL_TABLET | ORAL | 0 refills | Status: DC

## 2021-03-10 NOTE — Progress Notes (Signed)
Will place order for metoprolol 100 mg by mouth to take 2 hours prior to CT.

## 2021-03-10 NOTE — Telephone Encounter (Signed)
Reaching out to patient to offer assistance regarding upcoming cardiac imaging study; pt verbalizes understanding of appt date/time but would like to reschedule his appointment.  Pt states he currently has a bad cough and was advised by his PCP to not come in.  Pt would call back to re-schedule when he knew his schedule better.  Pt verified that he had his 50mg  benadryl and 3 tablets of 50mg  prednisone.  Pt stated that Dr. Johnsie Cancel had mentioned giving him a one-time pill for a heart rate lowering medication for the test but he doesn't have it in his possession.  Pt informed that we would reach out to Dr. Kyla Balzarine office to see.   Gordy Clement RN Navigator Cardiac Imaging [facility] [phone] office [phone] cell

## 2021-03-11 ENCOUNTER — Ambulatory Visit (HOSPITAL_COMMUNITY): Admission: RE | Admit: 2021-03-11 | Payer: Medicare Other | Source: Ambulatory Visit

## 2021-03-24 ENCOUNTER — Telehealth (HOSPITAL_COMMUNITY): Payer: Self-pay | Admitting: Emergency Medicine

## 2021-03-24 NOTE — Telephone Encounter (Signed)
Reaching out to patient to offer assistance regarding upcoming cardiac imaging study; pt verbalizes understanding of appt date/time, parking situation and where to check in, pre-test NPO status and medications ordered, and verified current allergies; name and call back number provided for further questions should they arise Marchia Bond RN Navigator Cardiac Imaging [facility] [phone] office [phone] cell   13 hr prep reviewed and given specific directions on when to take it ( 1:30a, 6:30a, 1:30p + benadryl)  Clarise Cruz

## 2021-03-25 ENCOUNTER — Other Ambulatory Visit: Payer: Self-pay

## 2021-03-25 ENCOUNTER — Ambulatory Visit (HOSPITAL_COMMUNITY)
Admission: RE | Admit: 2021-03-25 | Discharge: 2021-03-25 | Disposition: A | Discharge status: Home / Self Care | Payer: Medicare Other | Source: Ambulatory Visit | Attending: Cardiovascular Disease | Admitting: Cardiovascular Disease

## 2021-03-25 DIAGNOSIS — R0600 Dyspnea, unspecified: Secondary | ICD-10-CM | POA: Diagnosis not present

## 2021-03-25 DIAGNOSIS — R9431 Abnormal electrocardiogram [ECG] [EKG]: Secondary | ICD-10-CM | POA: Insufficient documentation

## 2021-03-25 DIAGNOSIS — R079 Chest pain, unspecified: Secondary | ICD-10-CM | POA: Insufficient documentation

## 2021-03-25 DIAGNOSIS — R011 Cardiac murmur, unspecified: Secondary | ICD-10-CM | POA: Insufficient documentation

## 2021-03-25 DIAGNOSIS — R0609 Other forms of dyspnea: Secondary | ICD-10-CM

## 2021-03-25 DIAGNOSIS — I2510 Atherosclerotic heart disease of native coronary artery without angina pectoris: Secondary | ICD-10-CM | POA: Diagnosis not present

## 2021-03-25 LAB — POCT I-STAT CREATININE: Creatinine, Ser: 0.9 mg/dL (ref 0.61–1.24)

## 2021-03-25 MED ORDER — METOPROLOL TARTRATE 5 MG/5ML IV SOLN
5.0000 mg | INTRAVENOUS | Status: DC | PRN
  Administered 2021-03-25: 5 mg via INTRAVENOUS

## 2021-03-25 MED ORDER — NITROGLYCERIN 0.4 MG SL SUBL: 0.8000 mg | SUBLINGUAL_TABLET | Freq: Once | SUBLINGUAL | Status: DC

## 2021-03-25 MED ORDER — METOPROLOL TARTRATE 5 MG/5ML IV SOLN
INTRAVENOUS | Status: AC
  Filled 2021-03-25: qty 5

## 2021-03-25 MED ORDER — IOHEXOL 350 MG/ML SOLN
80.0000 mL | Freq: Once | INTRAVENOUS | Status: AC | PRN
  Administered 2021-03-25: 80 mL via INTRAVENOUS

## 2021-03-25 MED ORDER — NITROGLYCERIN 0.4 MG SL SUBL
SUBLINGUAL_TABLET | SUBLINGUAL | Status: AC
  Filled 2021-03-25: qty 2

## 2021-03-26 ENCOUNTER — Telehealth: Payer: Self-pay

## 2021-03-26 ENCOUNTER — Ambulatory Visit (HOSPITAL_COMMUNITY)
Admission: RE | Admit: 2021-03-26 | Discharge: 2021-03-26 | Disposition: A | Discharge status: Home / Self Care | Payer: Medicare Other | Source: Ambulatory Visit | Attending: Cardiovascular Disease | Admitting: Cardiovascular Disease

## 2021-03-26 ENCOUNTER — Telehealth: Payer: Self-pay | Admitting: Internal Medicine

## 2021-03-26 DIAGNOSIS — R011 Cardiac murmur, unspecified: Secondary | ICD-10-CM | POA: Diagnosis present

## 2021-03-26 DIAGNOSIS — R931 Abnormal findings on diagnostic imaging of heart and coronary circulation: Secondary | ICD-10-CM | POA: Insufficient documentation

## 2021-03-26 DIAGNOSIS — R0600 Dyspnea, unspecified: Secondary | ICD-10-CM | POA: Insufficient documentation

## 2021-03-26 DIAGNOSIS — R9431 Abnormal electrocardiogram [ECG] [EKG]: Secondary | ICD-10-CM | POA: Diagnosis present

## 2021-03-26 DIAGNOSIS — R0609 Other forms of dyspnea: Secondary | ICD-10-CM

## 2021-03-26 DIAGNOSIS — R079 Chest pain, unspecified: Secondary | ICD-10-CM | POA: Diagnosis present

## 2021-03-26 DIAGNOSIS — I2510 Atherosclerotic heart disease of native coronary artery without angina pectoris: Secondary | ICD-10-CM | POA: Diagnosis not present

## 2021-03-26 MED ORDER — ISOSORBIDE MONONITRATE ER 30 MG PO TB24: 15.0000 mg | ORAL_TABLET | Freq: Every day | ORAL | 3 refills | Status: DC

## 2021-03-26 MED ORDER — METOPROLOL SUCCINATE ER 25 MG PO TB24: 12.5000 mg | ORAL_TABLET | Freq: Every day | ORAL | 3 refills | Status: DC

## 2021-03-26 NOTE — Telephone Encounter (Signed)
Pt called and requested call back at [phone] to discuss CT scan

## 2021-03-26 NOTE — Telephone Encounter (Signed)
Josue Hector, MD Michaelyn Barter, RN FFR CT positive but only in distal LAD start imdur 15 mg and toprol 12.5 mg daily f/u with me to discuss   Called patient with results. Will send in prescriptions to patient's pharmacy of choice. Patient was able to make appointment on 04/12/21. Patient could not do 04/02/21 due to out of town trip. Patient wanted to make sure this was okay and if not he will cancel the trip and be seen sooner. Will forward to Dr Johnsie Cancel for advisement.

## 2021-03-26 NOTE — Telephone Encounter (Signed)
-----   Message from Josue Hector, MD sent at 03/26/2021  9:54 AM EDT ----- Had CAD may be obstructive in LAD waiting on FFR Can make f/u for him to see me

## 2021-03-26 NOTE — Telephone Encounter (Signed)
Spoke with patient and agree with Dr. Kyla Balzarine plans for follow up.

## 2021-03-30 NOTE — H&P (View-Only) (Signed)
CARDIOLOGY CONSULT NOTE     Virtual Visit via Video Note   This visit type was conducted due to national recommendations for restrictions regarding the COVID-19 Pandemic (e.g. social distancing) in an effort to limit this patient's exposure and mitigate transmission in our community.  Due to her co-morbid illnesses, this patient is at least at moderate risk for complications without adequate follow up.  This format is felt to be most appropriate for this patient at this time.  All issues noted in this document were discussed and addressed.  A limited physical exam was performed with this format.  Please refer to the patient's chart for her consent to telehealth for [facility]   Patient Location Home Physician Location Office  Patient ID: ADIB WAHBA MRN: [id number] DOB/AGE: [birth year] 65 y.o.  Admit date: (Not on file) Referring Physician: [place] Primary Physician: Elby Showers, MD Primary Cardiologist: New Reason for Consultation: CAD Risk    HPI:  70 y.o. referred by Dr Renold Genta 01/05/21 to assess CAD risk. History of HLD started on statin for LDL 142 in setting of type 2 DM with A1c 6.3 He has horses and works a barn Weight up as he is struggling with an achilles problem and has had a right hip replacement  He has significant anxiety and uses xanax I saw him back in 2012 and he had a normal ETT 01/03/12 Long standing history of murmur no w/u since child   COVID July 2021   He is active with more exertional dyspnea. Right foot and achilles has limited him and weight is up Also post right  THR. 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 May be too small/distal   He is going to horse show with son in [place] and will be back next Wednesday I told him I thought it was safe to go Will arrange cath next Thursday Risks including stroke discussed Will be premedicated for contrast allergy Had no issues with CTA Will get labs in am day of cath   Showed him pictures of his FFR He and wife understand and in agreement with plan     ROS All other systems reviewed and negative except as noted above  Past Medical History:  Diagnosis Date  . Anxiety    mild  . Arthritis    Rt hip  . Cancer ([place]) 04/2019   Skin Rt hand. Dr. Ronnald Ramp  . Diabetes mellitus without complication ([place])   . GERD (gastroesophageal reflux disease)    Tums or omeprozole  . History of IBS   . History of kidney stones    30 years ago  . Hypertension 2009   Meds controlled    Family History  Problem Relation Age of Onset  . Cancer Father  Social History   Socioeconomic History  . Marital status: Married    Spouse name: Not on file  . Number of children: Not on file  . Years of education: Not on file  . Highest education level: Not on file  Occupational History  . Not on file  Tobacco Use  . Smoking status: Never Smoker  . Smokeless tobacco: Never Used  Vaping Use  . Vaping Use: Never used  Substance and Sexual Activity  . Alcohol use: Yes    Alcohol/week: 2.0 - 3.0 standard drinks    Types: 2 - 3 Shots of liquor per week  . Drug use: No  . Sexual activity: Yes  Other Topics Concern  . Not on file  Social History Narrative  . Not on file   Social Determinants of Health   Financial  Resource Strain: Not on file  Food Insecurity: Not on file  Transportation Needs: Not on file  Physical Activity: Not on file  Stress: Not on file  Social Connections: Not on file  Intimate Partner Violence: Not on file    Past Surgical History:  Procedure Laterality Date  . COLONOSCOPY     has IBS  . renal stone    . TOTAL HIP ARTHROPLASTY Right 05/10/2019   Procedure: RIGHT TOTAL HIP ARTHROPLASTY ANTERIOR APPROACH;  Surgeon: Mcarthur Rossetti, MD;  Location: [facility];  Service: Orthopedics;  Laterality: Right;      Current Outpatient Medications:  .  Accu-Chek FastClix Lancets MISC, USE TO TEST BLOOD GLUCOSE BEFORE BREAKFAST AND SUPPER, Disp: 102 each, Rfl: prn .  ACCU-CHEK GUIDE test strip, USE TO TEST BLOOD GLUCOSE BEFORE BREAKFAST AND SUPPER, Disp: 100 strip, Rfl: PRN .  ALPRAZolam (XANAX) 0.5 MG tablet, TAKE 1 TABLET BY MOUTH THREE TIMES A DAY, Disp: 90 tablet, Rfl: 5 .  azithromycin (ZITHROMAX) 250 MG tablet, 2 tabs day 1 followed by one tab days 2-5, Disp: 6 tablet, Rfl: 0 .  diphenhydrAMINE (BENADRYL) 50 MG tablet, Take one tablet 1 hour prior to CT., Disp: 1 tablet, Rfl: 0 .  glipiZIDE (GLUCOTROL) 5 MG tablet, 2 tabs by mouth before breakfast and supper., Disp: 120 tablet, Rfl: 3 .  hydrochlorothiazide (HYDRODIURIL) 25 MG tablet, TAKE 1 TABLET BY MOUTH EVERY DAY WITH LOSARTAN, Disp: 90 tablet, Rfl: 1 .  HYDROcodone-homatropine (HYCODAN) 5-1.5 MG/5ML syrup, Take 5 mLs by mouth every 8 (eight) hours as needed for cough., Disp: 120 mL, Rfl: 0 .  isosorbide mononitrate (IMDUR) 30 MG 24 hr tablet, Take 0.5 tablets (15 mg total) by mouth daily., Disp: 45 tablet, Rfl: 3 .  metoprolol succinate (TOPROL XL) 25 MG 24 hr tablet, Take 0.5 tablets (12.5 mg total) by mouth daily., Disp: 45 tablet, Rfl: 3 .  olmesartan (BENICAR) 20 MG tablet, TAKE 1 TABLET BY MOUTH EVERY DAY, Disp: 90 tablet, Rfl: 0 .  predniSONE (DELTASONE) 10 MG tablet, Take in tapering course as directed 6-5-4-3-2-1  for wheezing, Disp: 21 tablet, Rfl: 0 .  rosuvastatin (CRESTOR) 5 MG tablet, TAKE 1 TABLET BY MOUTH EVERY DAY, Disp: 90 tablet, Rfl: 1    Physical Exam: There were no vitals taken for this visit.   No distress No JVP elevation  No tachypnea    Labs:   Lab Results  Component Value Date   WBC 7.4 02/08/2021   HGB 16.3 02/08/2021   HCT 45.7 02/08/2021   MCV 93.6 02/08/2021   PLT 271 02/08/2021    Recent Labs  Lab 03/25/21 1434  CREATININE  0.90   No results found for: CKTOTAL, CKMB, CKMBINDEX, TROPONINI  Lab Results  Component Value Date   CHOL 212 (H) 11/09/2020   CHOL 173 05/11/2020   CHOL 201 (H) 05/07/2019   Lab Results  Component Value Date   HDL 37 (L) 11/09/2020   HDL 44 05/11/2020   HDL 45 05/07/2019   Lab Results  Component Value Date   LDLCALC 142 (H) 11/09/2020   LDLCALC 102 (H) 05/11/2020   LDLCALC 127 (H) 05/07/2019   Lab Results  Component Value Date   TRIG 189 (H) 11/09/2020   TRIG 171 (H) 05/11/2020   TRIG 170 (H) 05/07/2019   Lab Results  Component Value Date   CHOLHDL 5.7 (H) 11/09/2020   CHOLHDL 3.9 05/11/2020   CHOLHDL 4.5 05/07/2019   No results found for: LDLDIRECT    Radiology: CT CORONARY MORPH W/CTA COR W/SCORE W/CA W/CM &/OR WO/CM  Addendum Date: 03/25/2021   ADDENDUM REPORT: 03/25/2021 17:12 HISTORY: 65 yo male with chest pain, nonspecific Dyspnea on exertion (DOE) murmur EXAM: Cardiac/Coronary CTA TECHNIQUE: The patient was scanned on a [employer]. PROTOCOL: A 120 kV prospective scan was triggered in the descending thoracic aorta at 111 HU's. Axial non-contrast 3 mm slices were carried out through the heart. The data set was analyzed on a dedicated work station and scored using the [place]. Gantry rotation speed was 250 msecs and collimation was .6 mm. Beta blockade and 0.8 mg of sl NTG was given. The 3D data set was reconstructed in 5% intervals of the 67-82 % of the R-R cycle. Diastolic phases were analyzed on  a dedicated work station using MPR, MIP and VRT modes. The patient received 56mL OMNIPAQUE IOHEXOL 350 MG/ML SOLN of contrast. FINDINGS: Quality: Excellent, HR 65 Coronary calcium score: The patient's coronary artery calcium score is 587, which places the patient in the 83rd percentile. Coronary arteries: Normal coronary origins.  Right dominance. Right Coronary Artery: Dominant. Minimal mixed proximal and mid-vessel 1-24% stenosis (CADRADS1). Left Main Coronary Artery: Normal. Bifurcates into the LAD and LCx arteries. Left Anterior Descending Coronary Artery: Diffuse proximal to mid-vessel mixed mild to moderate stenoses (CADRADS3). Tortuous D1 with minimal mixed proximal 1-24% stenosis (CADRADS1) and a small D2 vessel without significant disease. Left Circumflex Artery: AV groove vessel - gives off a single OM1 branch. There is no significant stenosis. Aorta: Normal size, 29 mm at the mid ascending aorta (level of the PA bifurcation) measured double oblique. Aortic atherosclerosis. No dissection. Aortic Valve: Trileaflet.  Calcifications noted. Other findings: Normal pulmonary vein drainage into the left atrium. Normal left atrial appendage without a thrombus. Normal size of the pulmonary artery. IMPRESSION: 1. Mild to moderate mixed stenoses, primarily of the proximal LAD with heavy calcification, CADRADS = 3. Suspect non-obstructive, but will send CT FFR and report separately. 2. Coronary calcium score of 587. This was 83rd percentile for age and sex matched control. 3. Normal coronary origin with right dominance. 4. Trileaflet aortic valve with mild leaflet calcifications. Electronically Signed   By: Pixie Casino M.D.   On: 03/25/2021 17:12   Result Date: 03/25/2021 EXAM: OVER-READ INTERPRETATION  CT CHEST The following report is an over-read performed by radiologist Dr. Abigail Miyamoto of [facility], [place] on 03/25/2021. This over-read does not include interpretation of cardiac or coronary anatomy or  pathology. The coronary CTA interpretation by the cardiologist is attached. COMPARISON:  02/11/2016 chest radiograph. FINDINGS: Vascular: Normal aortic caliber. No central pulmonary embolism, on this non-dedicated study.  Mediastinum/Nodes: No imaged thoracic adenopathy. Lungs/Pleura: No pleural fluid.  Clear imaged lungs. Upper Abdomen: Normal imaged portions of the liver, spleen, stomach. Musculoskeletal: No acute osseous abnormality. IMPRESSION: No acute findings in the imaged extracardiac chest. Electronically Signed: By: Abigail Miyamoto M.D. On: 03/25/2021 16:40   CT CORONARY FRACTIONAL FLOW RESERVE DATA PREP  Result Date: 03/26/2021 EXAM: CT FFR ANALYSIS CLINICAL DATA:  Chest pain, nonspecific Dyspnea on exertion (DOE) FINDINGS: FFRct analysis was performed on the original cardiac CT angiogram dataset. Diagrammatic representation of the FFRct analysis is provided in a separate PDF document in PACS. This dictation was created using the PDF document and an interactive 3D model of the results. 3D model is not available in the EMR/PACS. Normal FFR range is >0.80. 1. Left Main: No significant stenosis. FFR = 1.00 2. LAD: Significant stenosis. Proximal FFR = 0.99, Mid FFR = 0.91, Distal FFR = 0.69 3. LCX: No significant stenosis. Proximal FFR = 0.99, Distal FFR = 0.96 4. RCA: No significant stenosis. Proximal FFR = 0.99, Mid FFR = 0.95, Distal FFR = 0.94 IMPRESSION: 1. CT FFR analysis demonstrates significant stenosis of the mid to distal LAD with a discrete drop in FFR to 0.69 (from 0.88). Clinical correlation with symptoms is advised. Electronically Signed   By: Pixie Casino M.D.   On: 03/26/2021 12:46    EKG: 05/15/20 NSR non specific ST changes    ASSESSMENT AND PLAN:   1. CAD : hemodynamically significant mid/distal LAD lesion by FFR ? Anginal equivalent dyspnea in diabetic Arranging cath next Thursday / Friday Lab called orders written labs morning of procedure all risks discussed willing to proceed.   2. DM:: continue glipizide  3. HLD:  F/u labs Dr Renold Genta on statin now 4. Ortho:  F/u for achilles tendinitis using meloxicam 5. Anxiety PRN xanax 6. AS:  Mild by TTE 02/02/21 mean gradient 10 peak 23 mmHg DVI 0.49 F/U echo in February 2023   F/U post cath  Time:  Spent reviewing cardiac CTA/FFR CT echo direct patient interview and arranging cath with orders 30 minutes   Signed: Jenkins Rouge 03/30/2021, 5:26 PM

## 2021-03-30 NOTE — Progress Notes (Signed)
CARDIOLOGY CONSULT NOTE     Virtual Visit via Video Note   This visit type was conducted due to national recommendations for restrictions regarding the COVID-19 Pandemic (e.g. social distancing) in an effort to limit this patient's exposure and mitigate transmission in our community.  Due to her co-morbid illnesses, this patient is at least at moderate risk for complications without adequate follow up.  This format is felt to be most appropriate for this patient at this time.  All issues noted in this document were discussed and addressed.  A limited physical exam was performed with this format.  Please refer to the patient's chart for her consent to telehealth for [facility].   Patient Location Home Physician Location Office  Patient ID: [name] [name] MRN: [id number] DOB/AGE: Jan 30, 1956 65 y.o.  Admit date: (Not on file) Referring Physician: [place] Primary Physician: Elby Showers, MD Primary Cardiologist: New Reason for Consultation: CAD Risk    HPI:  65 y.o. referred by Dr Renold Genta 01/05/21 to assess CAD risk. History of HLD started on statin for LDL 142 in setting of type 2 DM with A1c 6.3 He has horses and works a barn Weight up as he is struggling with an achilles problem and has had a right hip replacement  He has significant anxiety and uses xanax I saw him back in 2012 and he had a normal ETT 01/03/12 Long standing history of murmur no w/u since child   COVID July 2021   He is active with more exertional dyspnea. Right foot and achilles has limited him and weight is up Also post right  THR. 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 May be too small/distal   He is going to horse show with son in [place] and will be back next Wednesday I told him I thought it was safe to go Will arrange cath next Thursday Risks including stroke discussed Will be premedicated for contrast allergy Had no issues with CTA Will get labs in am day of cath   Showed him pictures of his FFR He and wife understand and in agreement with plan     ROS All other systems reviewed and negative except as noted above  Past Medical History:  Diagnosis Date  . Anxiety    mild  . Arthritis    Rt hip  . Cancer ([place]) 04/2019   Skin Rt hand. Dr. Ronnald Ramp  . Diabetes mellitus without complication ([place])   . GERD (gastroesophageal reflux disease)    Tums or omeprozole  . History of IBS   . History of kidney stones    30 years ago  . Hypertension 2009   Meds controlled    Family History  Problem Relation Age of Onset  . Cancer Father  Social History   Socioeconomic History  . Marital status: Married    Spouse name: Not on file  . Number of children: Not on file  . Years of education: Not on file  . Highest education level: Not on file  Occupational History  . Not on file  Tobacco Use  . Smoking status: Never Smoker  . Smokeless tobacco: Never Used  Vaping Use  . Vaping Use: Never used  Substance and Sexual Activity  . Alcohol use: Yes    Alcohol/week: 2.0 - 3.0 standard drinks    Types: 2 - 3 Shots of liquor per week  . Drug use: No  . Sexual activity: Yes  Other Topics Concern  . Not on file  Social History Narrative  . Not on file   Social Determinants of Health   Financial  Resource Strain: Not on file  Food Insecurity: Not on file  Transportation Needs: Not on file  Physical Activity: Not on file  Stress: Not on file  Social Connections: Not on file  Intimate Partner Violence: Not on file    Past Surgical History:  Procedure Laterality Date  . COLONOSCOPY     has IBS  . renal stone    . TOTAL HIP ARTHROPLASTY Right 05/10/2019   Procedure: RIGHT TOTAL HIP ARTHROPLASTY ANTERIOR APPROACH;  Surgeon: Mcarthur Rossetti, MD;  Location: [facility];  Service: Orthopedics;  Laterality: Right;      Current Outpatient Medications:  .  Accu-Chek FastClix Lancets MISC, USE TO TEST BLOOD GLUCOSE BEFORE BREAKFAST AND SUPPER, Disp: 102 each, Rfl: prn .  ACCU-CHEK GUIDE test strip, USE TO TEST BLOOD GLUCOSE BEFORE BREAKFAST AND SUPPER, Disp: 100 strip, Rfl: PRN .  ALPRAZolam (XANAX) 0.5 MG tablet, TAKE 1 TABLET BY MOUTH THREE TIMES A DAY, Disp: 90 tablet, Rfl: 5 .  azithromycin (ZITHROMAX) 250 MG tablet, 2 tabs day 1 followed by one tab days 2-5, Disp: 6 tablet, Rfl: 0 .  diphenhydrAMINE (BENADRYL) 50 MG tablet, Take one tablet 1 hour prior to CT., Disp: 1 tablet, Rfl: 0 .  glipiZIDE (GLUCOTROL) 5 MG tablet, 2 tabs by mouth before breakfast and supper., Disp: 120 tablet, Rfl: 3 .  hydrochlorothiazide (HYDRODIURIL) 25 MG tablet, TAKE 1 TABLET BY MOUTH EVERY DAY WITH LOSARTAN, Disp: 90 tablet, Rfl: 1 .  HYDROcodone-homatropine (HYCODAN) 5-1.5 MG/5ML syrup, Take 5 mLs by mouth every 8 (eight) hours as needed for cough., Disp: 120 mL, Rfl: 0 .  isosorbide mononitrate (IMDUR) 30 MG 24 hr tablet, Take 0.5 tablets (15 mg total) by mouth daily., Disp: 45 tablet, Rfl: 3 .  metoprolol succinate (TOPROL XL) 25 MG 24 hr tablet, Take 0.5 tablets (12.5 mg total) by mouth daily., Disp: 45 tablet, Rfl: 3 .  olmesartan (BENICAR) 20 MG tablet, TAKE 1 TABLET BY MOUTH EVERY DAY, Disp: 90 tablet, Rfl: 0 .  predniSONE (DELTASONE) 10 MG tablet, Take in tapering course as directed 6-5-4-3-2-1  for wheezing, Disp: 21 tablet, Rfl: 0 .  rosuvastatin (CRESTOR) 5 MG tablet, TAKE 1 TABLET BY MOUTH EVERY DAY, Disp: 90 tablet, Rfl: 1    Physical Exam: There were no vitals taken for this visit.   No distress No JVP elevation  No tachypnea    Labs:   Lab Results  Component Value Date   WBC 7.4 02/08/2021   HGB 16.3 02/08/2021   HCT 45.7 02/08/2021   MCV 93.6 02/08/2021   PLT 271 02/08/2021    Recent Labs  Lab 03/25/21 1434  CREATININE  0.90   No results found for: CKTOTAL, CKMB, CKMBINDEX, TROPONINI  Lab Results  Component Value Date   CHOL 212 (H) 11/09/2020   CHOL 173 05/11/2020   CHOL 201 (H) 05/07/2019   Lab Results  Component Value Date   HDL 37 (L) 11/09/2020   HDL 44 05/11/2020   HDL 45 05/07/2019   Lab Results  Component Value Date   LDLCALC 142 (H) 11/09/2020   LDLCALC 102 (H) 05/11/2020   LDLCALC 127 (H) 05/07/2019   Lab Results  Component Value Date   TRIG 189 (H) 11/09/2020   TRIG 171 (H) 05/11/2020   TRIG 170 (H) 05/07/2019   Lab Results  Component Value Date   CHOLHDL 5.7 (H) 11/09/2020   CHOLHDL 3.9 05/11/2020   CHOLHDL 4.5 05/07/2019   No results found for: LDLDIRECT    Radiology: CT CORONARY MORPH W/CTA COR W/SCORE W/CA W/CM &/OR WO/CM  Addendum Date: 03/25/2021   ADDENDUM REPORT: 03/25/2021 17:12 HISTORY: 65 yo male with chest pain, nonspecific Dyspnea on exertion (DOE) murmur EXAM: Cardiac/Coronary CTA TECHNIQUE: The patient was scanned on a [employer]. PROTOCOL: A 120 kV prospective scan was triggered in the descending thoracic aorta at 111 HU's. Axial non-contrast 3 mm slices were carried out through the heart. The data set was analyzed on a dedicated work station and scored using the [place]. Gantry rotation speed was 250 msecs and collimation was .6 mm. Beta blockade and 0.8 mg of sl NTG was given. The 3D data set was reconstructed in 5% intervals of the 67-82 % of the R-R cycle. Diastolic phases were analyzed on  a dedicated work station using MPR, MIP and VRT modes. The patient received 85mL OMNIPAQUE IOHEXOL 350 MG/ML SOLN of contrast. FINDINGS: Quality: Excellent, HR 65 Coronary calcium score: The patient's coronary artery calcium score is 587, which places the patient in the 83rd percentile. Coronary arteries: Normal coronary origins.  Right dominance. Right Coronary Artery: Dominant. Minimal mixed proximal and mid-vessel 1-24% stenosis (CADRADS1). Left Main Coronary Artery: Normal. Bifurcates into the LAD and LCx arteries. Left Anterior Descending Coronary Artery: Diffuse proximal to mid-vessel mixed mild to moderate stenoses (CADRADS3). Tortuous D1 with minimal mixed proximal 1-24% stenosis (CADRADS1) and a small D2 vessel without significant disease. Left Circumflex Artery: AV groove vessel - gives off a single OM1 branch. There is no significant stenosis. Aorta: Normal size, 29 mm at the mid ascending aorta (level of the PA bifurcation) measured double oblique. Aortic atherosclerosis. No dissection. Aortic Valve: Trileaflet.  Calcifications noted. Other findings: Normal pulmonary vein drainage into the left atrium. Normal left atrial appendage without a thrombus. Normal size of the pulmonary artery. IMPRESSION: 1. Mild to moderate mixed stenoses, primarily of the proximal LAD with heavy calcification, CADRADS = 3. Suspect non-obstructive, but will send CT FFR and report separately. 2. Coronary calcium score of 587. This was 83rd percentile for age and sex matched control. 3. Normal coronary origin with right dominance. 4. Trileaflet aortic valve with mild leaflet calcifications. Electronically Signed   By: Pixie Casino M.D.   On: 03/25/2021 17:12   Result Date: 03/25/2021 EXAM: OVER-READ INTERPRETATION  CT CHEST The following report is an over-read performed by radiologist Dr. Abigail Miyamoto of [facility], [place] on 03/25/2021. This over-read does not include interpretation of cardiac or coronary anatomy or  pathology. The coronary CTA interpretation by the cardiologist is attached. COMPARISON:  02/11/2016 chest radiograph. FINDINGS: Vascular: Normal aortic caliber. No central pulmonary embolism, on this non-dedicated study.  Mediastinum/Nodes: No imaged thoracic adenopathy. Lungs/Pleura: No pleural fluid.  Clear imaged lungs. Upper Abdomen: Normal imaged portions of the liver, spleen, stomach. Musculoskeletal: No acute osseous abnormality. IMPRESSION: No acute findings in the imaged extracardiac chest. Electronically Signed: By: Abigail Miyamoto M.D. On: 03/25/2021 16:40   CT CORONARY FRACTIONAL FLOW RESERVE DATA PREP  Result Date: 03/26/2021 EXAM: CT FFR ANALYSIS CLINICAL DATA:  Chest pain, nonspecific Dyspnea on exertion (DOE) FINDINGS: FFRct analysis was performed on the original cardiac CT angiogram dataset. Diagrammatic representation of the FFRct analysis is provided in a separate PDF document in PACS. This dictation was created using the PDF document and an interactive 3D model of the results. 3D model is not available in the EMR/PACS. Normal FFR range is >0.80. 1. Left Main: No significant stenosis. FFR = 1.00 2. LAD: Significant stenosis. Proximal FFR = 0.99, Mid FFR = 0.91, Distal FFR = 0.69 3. LCX: No significant stenosis. Proximal FFR = 0.99, Distal FFR = 0.96 4. RCA: No significant stenosis. Proximal FFR = 0.99, Mid FFR = 0.95, Distal FFR = 0.94 IMPRESSION: 1. CT FFR analysis demonstrates significant stenosis of the mid to distal LAD with a discrete drop in FFR to 0.69 (from 0.88). Clinical correlation with symptoms is advised. Electronically Signed   By: Pixie Casino M.D.   On: 03/26/2021 12:46    EKG: 05/15/20 NSR non specific ST changes    ASSESSMENT AND PLAN:   1. CAD : hemodynamically significant mid/distal LAD lesion by FFR ? Anginal equivalent dyspnea in diabetic Arranging cath next Thursday / Friday Lab called orders written labs morning of procedure all risks discussed willing to proceed.   2. DM:: continue glipizide  3. HLD:  F/u labs Dr Renold Genta on statin now 4. Ortho:  F/u for achilles tendinitis using meloxicam 5. Anxiety PRN xanax 6. AS:  Mild by TTE 02/02/21 mean gradient 10 peak 23 mmHg DVI 0.49 F/U echo in February 2023   F/U post cath  Time:  Spent reviewing cardiac CTA/FFR CT echo direct patient interview and arranging cath with orders 30 minutes   Signed: Jenkins Rouge 03/30/2021, 5:26 PM

## 2021-03-31 ENCOUNTER — Other Ambulatory Visit: Payer: Self-pay

## 2021-03-31 ENCOUNTER — Telehealth (INDEPENDENT_AMBULATORY_CARE_PROVIDER_SITE_OTHER): Payer: Medicare Other | Admitting: Cardiovascular Disease

## 2021-03-31 VITALS — BP 124/89 | HR 65 | Ht 72.0 in | Wt 216.0 lb

## 2021-03-31 DIAGNOSIS — I350 Nonrheumatic aortic (valve) stenosis: Secondary | ICD-10-CM | POA: Diagnosis not present

## 2021-03-31 DIAGNOSIS — E782 Mixed hyperlipidemia: Secondary | ICD-10-CM | POA: Diagnosis not present

## 2021-03-31 DIAGNOSIS — I2510 Atherosclerotic heart disease of native coronary artery without angina pectoris: Secondary | ICD-10-CM | POA: Diagnosis not present

## 2021-03-31 MED ORDER — PREDNISONE 50 MG PO TABS: ORAL_TABLET | ORAL | 0 refills | Status: DC

## 2021-03-31 MED ORDER — DIPHENHYDRAMINE HCL 50 MG PO TABS: ORAL_TABLET | ORAL | 0 refills | Status: DC

## 2021-03-31 NOTE — Patient Instructions (Addendum)
Medication Instructions:  *If you need a refill on your cardiac medications before your next appointment, please call your pharmacy*  Lab Work: Your physician recommends that you return for lab work next week for BMET and CBC.  If you have labs (blood work) drawn today and your tests are completely normal, you will receive your results only by: Marland Kitchen MyChart Message (if you have MyChart) OR . A paper copy in the mail If you have any lab test that is abnormal or we need to change your treatment, we will call you to review the results.  Testing/Procedures: Your physician has requested that you have a cardiac catheterization. Cardiac catheterization is used to diagnose and/or treat various heart conditions. Doctors may recommend this procedure for a number of different reasons. The most common reason is to evaluate chest pain. Chest pain can be a symptom of coronary artery disease (CAD), and cardiac catheterization can show whether plaque is narrowing or blocking your heart's arteries. This procedure is also used to evaluate the valves, as well as measure the blood flow and oxygen levels in different parts of your heart. For further information please visit [email or web address]. Please follow instruction sheet, as given.  Follow-Up: At [facility], you and your health needs are our priority.  As part of our continuing mission to provide you with exceptional heart care, we have created designated Provider Care Teams.  These Care Teams include your primary Cardiologist (physician) and Advanced Practice Providers (APPs -  Physician Assistants and Nurse Practitioners) who all work together to provide you with the care you need, when you need it.  We recommend signing up for the patient portal called "MyChart".  Sign up information is provided on this After Visit Summary.  MyChart is used to connect with patients for Virtual Visits (Telemedicine).  Patients are able to view lab/test results, encounter notes,  upcoming appointments, etc.  Non-urgent messages can be sent to your provider as well.   To learn more about what you can do with MyChart, go to [email or web address].    Your next appointment:   3 weeks  The format for your next appointment:   In Person  Provider:   You may see Dr. Johnsie Cancel or one of the following Advanced Practice Providers on your designated Care Team:    Kathyrn Drown, NP    Other [facility] OFFICE Branch, [place] [address] Dept: [phone] Loc: [place]  03/31/2021  You are scheduled for a Cardiac Catheterization on Monday, April 18 with Dr. Daneen Schick.  1. Please arrive at the [facility] (Main Entrance A) at [facility]: [address] [place], [place] [address] at 8:30 AM (This time is two hours before your procedure to ensure your preparation). Free valet parking service is available.   Special note: Every effort is made to have your procedure done on time. Please understand that emergencies sometimes delay scheduled procedures.  2. Diet: Do not eat solid foods after midnight.    3. Labs: You will need to have blood drawn on Thursday, April 14 at [facility] at [facility]. [address]  Open: 7:30am - 5pm    Phone: [phone]. You do not need to be fasting.  Due to recent COVID-19 restrictions implemented by our local and state authorities and in an effort to keep both patients and staff as  safe as possible, our hospital system requires COVID-19 testing prior to certain scheduled hospital procedures.  Please go to [place]. [place], [place] [address] on 04/08/21 at 2:45 pm  .  This is a drive up testing site.  You will not need to exit your vehicle.  You will not be billed at the time of testing but may receive a bill later depending on your insurance. You must agree to  self-quarantine from the time of your testing until the procedure date on 04/12/21.  This should included staying home with ONLY the people you live with.  Avoid take-out, grocery store shopping or leaving the house for any non-emergent reason.  Failure to have your COVID-19 test done on the date and time you have been scheduled will result in cancellation of your procedure.  Please call our office at [phone] if you have any questions.  4. Medication instructions in preparation for your procedure:   Contrast Allergy: Yes, Please take Prednisone 50mg  by mouth at: Thirteen hours prior to cath 10:00pm on Sunday Seven hours prior to cath 3:00am on Monday And prior to leaving home please take last dose of Prednisone 50mg  and Benadryl 50mg  by mouth.  Stop taking, Benicar (Olmesartan) Monday, April 18,, HTCZ (Hydrochlorothiazide) Monday, April 18,  Do not take Diabetes Med glipizide on the day of the procedure and HOLD 48 HOURS AFTER THE PROCEDURE.  On the morning of your procedure, take your Aspirin and any morning medicines NOT listed above.  You may use sips of water.  5. Plan for one night stay--bring personal belongings. 6. Bring a current list of your medications and current insurance cards. 7. You MUST have a responsible person to drive you home. 8. Someone MUST be with you the first 24 hours after you arrive home or your discharge will be delayed. 9. Please wear clothes that are easy to get on and off and wear slip-on shoes.  Thank you for allowing [place] to care for you!   -- [facility]

## 2021-04-01 ENCOUNTER — Other Ambulatory Visit: Payer: Self-pay | Admitting: Cardiovascular Disease

## 2021-04-01 MED ORDER — SODIUM CHLORIDE 0.9% FLUSH: 3.0000 mL | Freq: Two times a day (BID) | INTRAVENOUS | Status: DC

## 2021-04-05 NOTE — Telephone Encounter (Signed)
Called patient about changing appointment for testing. Informed patient that Wednesday would be too far out for testing, so patient will keep appointments on Thursday.

## 2021-04-08 ENCOUNTER — Other Ambulatory Visit (HOSPITAL_COMMUNITY)
Admission: RE | Admit: 2021-04-08 | Discharge: 2021-04-08 | Disposition: A | Discharge status: Home / Self Care | Payer: Medicare Other | Source: Ambulatory Visit | Attending: Interventional Cardiology | Admitting: Interventional Cardiology

## 2021-04-08 ENCOUNTER — Other Ambulatory Visit: Payer: Medicare Other | Admitting: *Deleted

## 2021-04-08 ENCOUNTER — Telehealth: Payer: Self-pay | Admitting: *Deleted

## 2021-04-08 ENCOUNTER — Other Ambulatory Visit: Payer: Self-pay

## 2021-04-08 DIAGNOSIS — I2510 Atherosclerotic heart disease of native coronary artery without angina pectoris: Secondary | ICD-10-CM

## 2021-04-08 DIAGNOSIS — E782 Mixed hyperlipidemia: Secondary | ICD-10-CM

## 2021-04-08 DIAGNOSIS — Z20822 Contact with and (suspected) exposure to covid-19: Secondary | ICD-10-CM | POA: Diagnosis not present

## 2021-04-08 DIAGNOSIS — Z01812 Encounter for preprocedural laboratory examination: Secondary | ICD-10-CM | POA: Insufficient documentation

## 2021-04-08 DIAGNOSIS — I350 Nonrheumatic aortic (valve) stenosis: Secondary | ICD-10-CM

## 2021-04-08 LAB — SARS CORONAVIRUS 2 (TAT 6-24 HRS): SARS Coronavirus 2: NEGATIVE

## 2021-04-08 NOTE — Telephone Encounter (Signed)
Pt contacted pre-catheterization scheduled at [facility] for:  Verified arrival time and place: [facility]) at:   No solid food after midnight prior to cath, clear liquids until 5 AM day of procedure. CONTRAST ALLERGY: 13 hour Prednisone and Benadryl Prep reviewed with patient: 04/11/21 Prednisone 50 mg 9:30 PM 04/12/21 Prednisone 50 mg 3:30 AM 04/12/21 Prednisone 50 mg and Benadryl 50 mg just prior to leaving home for hospital AM of procedure. Do not drive  Hold: HCTZ-AM of procedure Glipizide-AM of procedure  Except hold medications AM meds can be  taken pre-cath with sips of water including: ASA 81 mg Prednisone 50 mg Benadryl 50 mg  Confirmed patient has responsible adult to drive home post procedure and be with patient first 24 hours after arriving home: yes  You are allowed ONE visitor in the waiting room during the time you are at the hospital for your procedure. Both you and your visitor must wear a mask once you enter the hospital.   Reviewed procedure/mask/visitor instructions with patient.

## 2021-04-09 LAB — CBC WITH DIFFERENTIAL/PLATELET
Basophils Absolute: 0 10*3/uL (ref 0.0–0.2)
Basos: 1 %
EOS (ABSOLUTE): 0.2 10*3/uL (ref 0.0–0.4)
Eos: 3 %
Hematocrit: 38.1 % (ref 37.5–51.0)
Hemoglobin: 13.5 g/dL (ref 13.0–17.7)
Immature Grans (Abs): 0 10*3/uL (ref 0.0–0.1)
Immature Granulocytes: 0 %
Lymphocytes Absolute: 3.1 10*3/uL (ref 0.7–3.1)
Lymphs: 52 %
MCH: 33.8 pg — ABNORMAL HIGH (ref 26.6–33.0)
MCHC: 35.4 g/dL (ref 31.5–35.7)
MCV: 96 fL (ref 79–97)
Monocytes Absolute: 0.3 10*3/uL (ref 0.1–0.9)
Monocytes: 6 %
Neutrophils Absolute: 2.2 10*3/uL (ref 1.4–7.0)
Neutrophils: 38 %
Platelets: 159 10*3/uL (ref 150–450)
RBC: 3.99 x10E6/uL — ABNORMAL LOW (ref 4.14–5.80)
RDW: 14 % (ref 11.6–15.4)
WBC: 5.8 10*3/uL (ref 3.4–10.8)

## 2021-04-09 LAB — BASIC METABOLIC PANEL
BUN/Creatinine Ratio: 15 (ref 10–24)
BUN: 16 mg/dL (ref 8–27)
CO2: 26 mmol/L (ref 20–29)
Calcium: 9.4 mg/dL (ref 8.6–10.2)
Chloride: 100 mmol/L (ref 96–106)
Creatinine, Ser: 1.04 mg/dL (ref 0.76–1.27)
Glucose: 133 mg/dL — ABNORMAL HIGH (ref 65–99)
Potassium: 3.9 mmol/L (ref 3.5–5.2)
Sodium: 139 mmol/L (ref 134–144)
eGFR: 79 mL/min/{1.73_m2} (ref >59)

## 2021-04-12 ENCOUNTER — Ambulatory Visit (HOSPITAL_COMMUNITY)
Admission: RE | Admit: 2021-04-12 | Discharge: 2021-04-12 | Disposition: A | Discharge status: Home / Self Care | Payer: Medicare Other | Attending: Interventional Cardiology | Admitting: Interventional Cardiology

## 2021-04-12 ENCOUNTER — Ambulatory Visit: Payer: Medicare Other | Admitting: Cardiovascular Disease

## 2021-04-12 ENCOUNTER — Other Ambulatory Visit: Payer: Self-pay

## 2021-04-12 ENCOUNTER — Encounter (HOSPITAL_COMMUNITY)
Admission: RE | Disposition: A | Discharge status: Home / Self Care | Payer: Self-pay | Source: Home / Self Care | Attending: Interventional Cardiology

## 2021-04-12 DIAGNOSIS — Z79899 Other long term (current) drug therapy: Secondary | ICD-10-CM | POA: Diagnosis not present

## 2021-04-12 DIAGNOSIS — I10 Essential (primary) hypertension: Secondary | ICD-10-CM | POA: Diagnosis present

## 2021-04-12 DIAGNOSIS — R0609 Other forms of dyspnea: Secondary | ICD-10-CM | POA: Diagnosis not present

## 2021-04-12 DIAGNOSIS — I2510 Atherosclerotic heart disease of native coronary artery without angina pectoris: Secondary | ICD-10-CM | POA: Diagnosis not present

## 2021-04-12 DIAGNOSIS — I25118 Atherosclerotic heart disease of native coronary artery with other forms of angina pectoris: Secondary | ICD-10-CM | POA: Diagnosis present

## 2021-04-12 DIAGNOSIS — E785 Hyperlipidemia, unspecified: Secondary | ICD-10-CM | POA: Insufficient documentation

## 2021-04-12 DIAGNOSIS — E119 Type 2 diabetes mellitus without complications: Secondary | ICD-10-CM | POA: Diagnosis not present

## 2021-04-12 DIAGNOSIS — Z7984 Long term (current) use of oral hypoglycemic drugs: Secondary | ICD-10-CM | POA: Diagnosis not present

## 2021-04-12 DIAGNOSIS — F419 Anxiety disorder, unspecified: Secondary | ICD-10-CM | POA: Insufficient documentation

## 2021-04-12 HISTORY — PX: LEFT HEART CATH AND CORONARY ANGIOGRAPHY: CATH118249

## 2021-04-12 LAB — BASIC METABOLIC PANEL
Anion gap: 9 (ref 5–15)
BUN: 15 mg/dL (ref 8–23)
CO2: 26 mmol/L (ref 22–32)
Calcium: 9.6 mg/dL (ref 8.9–10.3)
Chloride: 103 mmol/L (ref 98–111)
Creatinine, Ser: 0.99 mg/dL (ref 0.61–1.24)
GFR, Estimated: >60 mL/min (ref >60)
Glucose, Bld: 217 mg/dL — ABNORMAL HIGH (ref 70–99)
Potassium: 3.9 mmol/L (ref 3.5–5.1)
Sodium: 138 mmol/L (ref 135–145)

## 2021-04-12 LAB — CBC
HCT: 43.4 % (ref 39.0–52.0)
Hemoglobin: 15.4 g/dL (ref 13.0–17.0)
MCH: 32.6 pg (ref 26.0–34.0)
MCHC: 35.5 g/dL (ref 30.0–36.0)
MCV: 91.8 fL (ref 80.0–100.0)
Platelets: 166 10*3/uL (ref 150–400)
RBC: 4.73 MIL/uL (ref 4.22–5.81)
RDW: 13.4 % (ref 11.5–15.5)
WBC: 7 10*3/uL (ref 4.0–10.5)
nRBC: 0 % (ref 0.0–0.2)

## 2021-04-12 LAB — GLUCOSE, CAPILLARY: Glucose-Capillary: 226 mg/dL — ABNORMAL HIGH (ref 70–99)

## 2021-04-12 SURGERY — LEFT HEART CATH AND CORONARY ANGIOGRAPHY
Anesthesia: LOCAL

## 2021-04-12 MED ORDER — IOHEXOL 350 MG/ML SOLN
INTRAVENOUS | Status: DC | PRN
  Administered 2021-04-12: 80 mL

## 2021-04-12 MED ORDER — OXYCODONE HCL 5 MG PO TABS: 5.0000 mg | ORAL_TABLET | ORAL | Status: DC | PRN

## 2021-04-12 MED ORDER — MIDAZOLAM HCL 2 MG/2ML IJ SOLN
INTRAMUSCULAR | Status: DC | PRN
  Administered 2021-04-12 (×2): 1 mg via INTRAVENOUS

## 2021-04-12 MED ORDER — HEPARIN (PORCINE) IN NACL 1000-0.9 UT/500ML-% IV SOLN
INTRAVENOUS | Status: AC
  Filled 2021-04-12: qty 1000

## 2021-04-12 MED ORDER — LABETALOL HCL 5 MG/ML IV SOLN: 10.0000 mg | INTRAVENOUS | Status: DC | PRN

## 2021-04-12 MED ORDER — HEPARIN SODIUM (PORCINE) 1000 UNIT/ML IJ SOLN
INTRAMUSCULAR | Status: AC
  Filled 2021-04-12: qty 1

## 2021-04-12 MED ORDER — SODIUM CHLORIDE 0.9 % WEIGHT BASED INFUSION
3.0000 mL/kg/h | INTRAVENOUS | Status: DC
  Administered 2021-04-12: 3 mL/kg/h via INTRAVENOUS

## 2021-04-12 MED ORDER — VERAPAMIL HCL 2.5 MG/ML IV SOLN
INTRAVENOUS | Status: DC | PRN
  Administered 2021-04-12: 10 mL via INTRA_ARTERIAL

## 2021-04-12 MED ORDER — LIDOCAINE HCL (PF) 1 % IJ SOLN
INTRAMUSCULAR | Status: AC
  Filled 2021-04-12: qty 30

## 2021-04-12 MED ORDER — ONDANSETRON HCL 4 MG/2ML IJ SOLN: 4.0000 mg | Freq: Four times a day (QID) | INTRAMUSCULAR | Status: DC | PRN

## 2021-04-12 MED ORDER — ASPIRIN 81 MG PO CHEW: 81.0000 mg | CHEWABLE_TABLET | ORAL | Status: DC

## 2021-04-12 MED ORDER — DIPHENHYDRAMINE HCL 50 MG/ML IJ SOLN: 50.0000 mg | Freq: Once | INTRAMUSCULAR | Status: DC

## 2021-04-12 MED ORDER — HYDRALAZINE HCL 20 MG/ML IJ SOLN: 10.0000 mg | INTRAMUSCULAR | Status: DC | PRN

## 2021-04-12 MED ORDER — SODIUM CHLORIDE 0.9% FLUSH: 3.0000 mL | INTRAVENOUS | Status: DC | PRN

## 2021-04-12 MED ORDER — HEPARIN SODIUM (PORCINE) 1000 UNIT/ML IJ SOLN
INTRAMUSCULAR | Status: DC | PRN
  Administered 2021-04-12: 5000 [IU] via INTRAVENOUS

## 2021-04-12 MED ORDER — HEPARIN (PORCINE) IN NACL 1000-0.9 UT/500ML-% IV SOLN
INTRAVENOUS | Status: DC | PRN
  Administered 2021-04-12 (×2): 500 mL

## 2021-04-12 MED ORDER — METHYLPREDNISOLONE SODIUM SUCC 125 MG IJ SOLR: 40.0000 mg | INTRAMUSCULAR | Status: DC

## 2021-04-12 MED ORDER — DIPHENHYDRAMINE HCL 25 MG PO CAPS: 50.0000 mg | ORAL_CAPSULE | Freq: Once | ORAL | Status: DC

## 2021-04-12 MED ORDER — MIDAZOLAM HCL 2 MG/2ML IJ SOLN
INTRAMUSCULAR | Status: AC
  Filled 2021-04-12: qty 2

## 2021-04-12 MED ORDER — DIPHENHYDRAMINE HCL 50 MG/ML IJ SOLN
INTRAMUSCULAR | Status: AC
  Filled 2021-04-12: qty 1

## 2021-04-12 MED ORDER — ASPIRIN 81 MG PO CHEW: 81.0000 mg | CHEWABLE_TABLET | Freq: Every day | ORAL | Status: DC

## 2021-04-12 MED ORDER — SODIUM CHLORIDE 0.9% FLUSH
3.0000 mL | INTRAVENOUS | Status: DC | PRN
Start: 2021-04-12 — End: 2021-04-12

## 2021-04-12 MED ORDER — SODIUM CHLORIDE 0.9 % IV SOLN: 250.0000 mL | INTRAVENOUS | Status: DC | PRN

## 2021-04-12 MED ORDER — SODIUM CHLORIDE 0.9 % WEIGHT BASED INFUSION: 1.0000 mL/kg/h | INTRAVENOUS | Status: DC

## 2021-04-12 MED ORDER — METHYLPREDNISOLONE SODIUM SUCC 125 MG IJ SOLR
INTRAMUSCULAR | Status: AC
  Filled 2021-04-12: qty 2

## 2021-04-12 MED ORDER — LIDOCAINE HCL (PF) 1 % IJ SOLN
INTRAMUSCULAR | Status: DC | PRN
  Administered 2021-04-12: 10 mL

## 2021-04-12 MED ORDER — FENTANYL CITRATE (PF) 100 MCG/2ML IJ SOLN
INTRAMUSCULAR | Status: DC | PRN
  Administered 2021-04-12: 25 ug via INTRAVENOUS
  Administered 2021-04-12: 50 ug via INTRAVENOUS

## 2021-04-12 MED ORDER — VERAPAMIL HCL 2.5 MG/ML IV SOLN
INTRAVENOUS | Status: AC
  Filled 2021-04-12: qty 2

## 2021-04-12 MED ORDER — SODIUM CHLORIDE 0.9 % IV SOLN: INTRAVENOUS | Status: DC

## 2021-04-12 MED ORDER — SODIUM CHLORIDE 0.9% FLUSH: 3.0000 mL | Freq: Two times a day (BID) | INTRAVENOUS | Status: DC

## 2021-04-12 MED ORDER — ACETAMINOPHEN 325 MG PO TABS: 650.0000 mg | ORAL_TABLET | ORAL | Status: DC | PRN

## 2021-04-12 MED ORDER — FENTANYL CITRATE (PF) 100 MCG/2ML IJ SOLN
INTRAMUSCULAR | Status: AC
  Filled 2021-04-12: qty 2

## 2021-04-12 SURGICAL SUPPLY — 10 items
CATH 5FR JL3.5 JR4 ANG PIG MP (CATHETERS) ×2 IMPLANT
DEVICE RAD COMP TR BAND LRG (VASCULAR PRODUCTS) ×2 IMPLANT
GLIDESHEATH SLEND A-KIT 6F 22G (SHEATH) ×1 IMPLANT
GUIDEWIRE INQWIRE 1.5J.035X260 (WIRE) IMPLANT
INQWIRE 1.5J .035X260CM (WIRE) ×2
KIT HEART LEFT (KITS) ×2 IMPLANT
PACK CARDIAC CATHETERIZATION (CUSTOM PROCEDURE TRAY) ×2 IMPLANT
SHEATH PROBE COVER 6X72 (BAG) ×1 IMPLANT
TRANSDUCER W/STOPCOCK (MISCELLANEOUS) ×2 IMPLANT
TUBING CIL FLEX 10 FLL-RA (TUBING) ×4 IMPLANT

## 2021-04-12 NOTE — Interval H&P Note (Signed)
Cath Lab Visit (complete for each Cath Lab visit)  Clinical Evaluation Leading to the Procedure:   ACS: No.  Non-ACS:    Anginal Classification: CCS II  Anti-ischemic medical therapy: Minimal Therapy (1 class of medications)  Non-Invasive Test Results: No non-invasive testing performed  Prior CABG: No previous CABG      History and Physical Interval Note:  04/12/2021 10:50 AM  [name] [name]  has presented today for surgery, with the diagnosis of abnormal cardiac ct.  The various methods of treatment have been discussed with the patient and family. After consideration of risks, benefits and other options for treatment, the patient has consented to  Procedure(s): LEFT HEART CATH AND CORONARY ANGIOGRAPHY (N/A) as a surgical intervention.  The patient's history has been reviewed, patient examined, no change in status, stable for surgery.  I have reviewed the patient's chart and labs.  Questions were answered to the patient's satisfaction.     Belva Crome III

## 2021-04-12 NOTE — CV Procedure (Signed)
   Right dominant coronary anatomy  Luminal irregularities in LAD  Overall widely patent coronary arteries without obstructive disease.

## 2021-04-12 NOTE — Progress Notes (Signed)
TR BAND REMOVAL  LOCATION:    right radial  DEFLATED PER PROTOCOL:    Yes.    TIME BAND OFF / DRESSING APPLIED:    1400   SITE UPON ARRIVAL:    Level 0  SITE AFTER BAND REMOVAL:    Level 0  CIRCULATION SENSATION AND MOVEMENT:    Within Normal Limits   Yes.    COMMENTS:   Patient c/o numbess when site pressed, no hematoma, able to move all fingers.

## 2021-04-12 NOTE — Discharge Instructions (Signed)
DRINK PLENTY OF FLUIDS OVER THE NEXT 2-3 DAYS.  Radial Site Care  This sheet gives you information about how to care for yourself after your procedure. Your health care provider may also give you more specific instructions. If you have problems or questions, contact your health care provider. What can I expect after the procedure? After the procedure, it is common to have:  Bruising and tenderness at the catheter insertion area. Follow these instructions at home: Medicines  Take over-the-counter and prescription medicines only as told by your health care provider. Insertion site care  Follow instructions from your health care provider about how to take care of your insertion site. Make sure you: ? Wash your hands with soap and water before you change your bandage (dressing). If soap and water are not available, use hand sanitizer. ? Change your dressing as told by your health care provider. ? Leave stitches (sutures), skin glue, or adhesive strips in place. These skin closures may need to stay in place for 2 weeks or longer. If adhesive strip edges start to loosen and curl up, you may trim the loose edges. Do not remove adhesive strips completely unless your health care provider tells you to do that.  Check your insertion site every day for signs of infection. Check for: ? Redness, swelling, or pain. ? Fluid or blood. ? Pus or a bad smell. ? Warmth.  Do not take baths, swim, or use a hot tub until your health care provider approves.  You may shower 24-48 hours after the procedure, or as directed by your health care provider. ? Remove the dressing and gently wash the site with plain soap and water. ? Pat the area dry with a clean towel. ? Do not rub the site. That could cause bleeding.  Do not apply powder or lotion to the site. Activity  For 24 hours after the procedure, or as directed by your health care provider: ? Do not flex or bend the affected arm. ? Do not push or pull  heavy objects with the affected arm. ? Do not drive yourself home from the hospital or clinic. You may drive 24 hours after the procedure unless your health care provider tells you not to. ? Do not operate machinery or power tools.  Do not lift anything that is heavier than 10 lb (4.5 kg), or the limit that you are told, until your health care provider says that it is safe.  Ask your health care provider when it is okay to: ? Return to work or school. ? Resume usual physical activities or sports. ? Resume sexual activity.   General instructions  If the catheter site starts to bleed, raise your arm and put firm pressure on the site. If the bleeding does not stop, get help right away. This is a medical emergency.  If you went home on the same day as your procedure, a responsible adult should be with you for the first 24 hours after you arrive home.  Keep all follow-up visits as told by your health care provider. This is important. Contact a health care provider if:  You have a fever.  You have redness, swelling, or yellow drainage around your insertion site. Get help right away if:  You have unusual pain at the radial site.  The catheter insertion area swells very fast.  The insertion area is bleeding, and the bleeding does not stop when you hold steady pressure on the area.  Your arm or hand becomes pale,   cool, tingly, or numb. These symptoms may represent a serious problem that is an emergency. Do not wait to see if the symptoms will go away. Get medical help right away. Call your local emergency services (911 in the [place] Do not drive yourself to the hospital. Summary  After the procedure, it is common to have bruising and tenderness at the site.  Follow instructions from your health care provider about how to take care of your radial site wound. Check the wound every day for signs of infection.  Do not lift anything that is heavier than 10 lb (4.5 kg), or the limit that you  are told, until your health care provider says that it is safe. This information is not intended to replace advice given to you by your health care provider. Make sure you discuss any questions you have with your health care provider. Document Revised: 01/17/2018 Document Reviewed: 01/17/2018 Elsevier Patient Education  2021 Elsevier Inc.  

## 2021-04-12 NOTE — Progress Notes (Signed)
Notified Reino Bellis, NP that patient is c/o of numbness in right wrist and thumb area.  She will come assess.

## 2021-04-12 NOTE — Progress Notes (Signed)
    Called to bedside to evaluate radial cath site. Patient complained of numbness distal to stick site extending into the thumb.   TR Band removed prior to assessment No hematoma Cap refill <3 secs No Bruit noted Radial/ulner pulses noted  Spoke with staff present during case. Informed he received increased amount of local analgesia at puncture site as he was a difficult stick. No hematoma present. He was observed in SS and reassessed to find the numbness had improved only noted just below stick site. Instructed to monitor, call back if persist, hematoma develops or numbness extends into hand.   SignedReino Bellis, NP-C 04/12/2021, 4:22 PM Pager: [phone]

## 2021-04-13 ENCOUNTER — Encounter (HOSPITAL_COMMUNITY): Payer: Self-pay | Admitting: Interventional Cardiology

## 2021-05-03 ENCOUNTER — Ambulatory Visit: Payer: Medicare Other | Admitting: Cardiology

## 2021-05-17 ENCOUNTER — Other Ambulatory Visit: Payer: Medicare Other | Admitting: Internal Medicine

## 2021-05-17 ENCOUNTER — Other Ambulatory Visit: Payer: Self-pay | Admitting: Internal Medicine

## 2021-05-17 ENCOUNTER — Other Ambulatory Visit: Payer: Self-pay

## 2021-05-17 DIAGNOSIS — E119 Type 2 diabetes mellitus without complications: Secondary | ICD-10-CM

## 2021-05-17 DIAGNOSIS — K580 Irritable bowel syndrome with diarrhea: Secondary | ICD-10-CM

## 2021-05-17 DIAGNOSIS — E1169 Type 2 diabetes mellitus with other specified complication: Secondary | ICD-10-CM

## 2021-05-17 DIAGNOSIS — Z0000 Encounter for general adult medical examination without abnormal findings: Secondary | ICD-10-CM

## 2021-05-17 DIAGNOSIS — I350 Nonrheumatic aortic (valve) stenosis: Secondary | ICD-10-CM

## 2021-05-17 DIAGNOSIS — I10 Essential (primary) hypertension: Secondary | ICD-10-CM

## 2021-05-17 DIAGNOSIS — Z8719 Personal history of other diseases of the digestive system: Secondary | ICD-10-CM

## 2021-05-17 DIAGNOSIS — I340 Nonrheumatic mitral (valve) insufficiency: Secondary | ICD-10-CM

## 2021-05-18 ENCOUNTER — Ambulatory Visit (INDEPENDENT_AMBULATORY_CARE_PROVIDER_SITE_OTHER): Payer: Medicare Other | Admitting: Internal Medicine

## 2021-05-18 ENCOUNTER — Encounter: Payer: Self-pay | Admitting: Internal Medicine

## 2021-05-18 VITALS — BP 120/80 | HR 60 | Ht 72.0 in | Wt 226.0 lb

## 2021-05-18 DIAGNOSIS — Z6830 Body mass index (BMI) 30.0-30.9, adult: Secondary | ICD-10-CM

## 2021-05-18 DIAGNOSIS — E119 Type 2 diabetes mellitus without complications: Secondary | ICD-10-CM

## 2021-05-18 DIAGNOSIS — Z23 Encounter for immunization: Secondary | ICD-10-CM

## 2021-05-18 DIAGNOSIS — Z8719 Personal history of other diseases of the digestive system: Secondary | ICD-10-CM | POA: Diagnosis not present

## 2021-05-18 DIAGNOSIS — I10 Essential (primary) hypertension: Secondary | ICD-10-CM

## 2021-05-18 DIAGNOSIS — I2510 Atherosclerotic heart disease of native coronary artery without angina pectoris: Secondary | ICD-10-CM | POA: Diagnosis not present

## 2021-05-18 DIAGNOSIS — E1169 Type 2 diabetes mellitus with other specified complication: Secondary | ICD-10-CM

## 2021-05-18 DIAGNOSIS — Z0000 Encounter for general adult medical examination without abnormal findings: Secondary | ICD-10-CM

## 2021-05-18 DIAGNOSIS — Z8659 Personal history of other mental and behavioral disorders: Secondary | ICD-10-CM

## 2021-05-18 DIAGNOSIS — K580 Irritable bowel syndrome with diarrhea: Secondary | ICD-10-CM

## 2021-05-18 DIAGNOSIS — E782 Mixed hyperlipidemia: Secondary | ICD-10-CM

## 2021-05-18 DIAGNOSIS — Z96641 Presence of right artificial hip joint: Secondary | ICD-10-CM

## 2021-05-18 DIAGNOSIS — I340 Nonrheumatic mitral (valve) insufficiency: Secondary | ICD-10-CM

## 2021-05-18 DIAGNOSIS — I350 Nonrheumatic aortic (valve) stenosis: Secondary | ICD-10-CM

## 2021-05-18 LAB — CBC WITH DIFFERENTIAL/PLATELET
Absolute Monocytes: 300 cells/uL (ref 200–950)
Basophils Absolute: 40 cells/uL (ref 0–200)
Basophils Relative: 1 %
Eosinophils Absolute: 92 cells/uL (ref 15–500)
Eosinophils Relative: 2.3 %
HCT: 42.3 % (ref 38.5–50.0)
Hemoglobin: 14.5 g/dL (ref 13.2–17.1)
Lymphs Abs: 2080 cells/uL (ref 850–3900)
MCH: 32.4 pg (ref 27.0–33.0)
MCHC: 34.3 g/dL (ref 32.0–36.0)
MCV: 94.4 fL (ref 80.0–100.0)
MPV: 9.7 fL (ref 7.5–12.5)
Monocytes Relative: 7.5 %
Neutro Abs: 1488 cells/uL — ABNORMAL LOW (ref 1500–7800)
Neutrophils Relative %: 37.2 %
Platelets: 159 10*3/uL (ref 140–400)
RBC: 4.48 10*6/uL (ref 4.20–5.80)
RDW: 14 % (ref 11.0–15.0)
Total Lymphocyte: 52 %
WBC: 4 10*3/uL (ref 3.8–10.8)

## 2021-05-18 LAB — LIPID PANEL
Cholesterol: 136 mg/dL (ref <200)
HDL: 48 mg/dL (ref >=40)
LDL Cholesterol (Calc): 67 mg/dL (calc)
Non-HDL Cholesterol (Calc): 88 mg/dL (calc) (ref <130)
Total CHOL/HDL Ratio: 2.8 (calc) (ref <5.0)
Triglycerides: 124 mg/dL (ref <150)

## 2021-05-18 LAB — PSA: PSA: 1.51 ng/mL (ref <=4.00)

## 2021-05-18 LAB — HEMOGLOBIN A1C
Hgb A1c MFr Bld: 5.9 % of total Hgb — ABNORMAL HIGH (ref <5.7)
Mean Plasma Glucose: 123 mg/dL
eAG (mmol/L): 6.8 mmol/L

## 2021-05-18 LAB — COMPLETE METABOLIC PANEL WITH GFR
AG Ratio: 1.9 (calc) (ref 1.0–2.5)
ALT: 29 U/L (ref 9–46)
AST: 24 U/L (ref 10–35)
Albumin: 4.6 g/dL (ref 3.6–5.1)
Alkaline phosphatase (APISO): 41 U/L (ref 35–144)
BUN: 20 mg/dL (ref 7–25)
CO2: 29 mmol/L (ref 20–32)
Calcium: 9 mg/dL (ref 8.6–10.3)
Chloride: 102 mmol/L (ref 98–110)
Creat: 0.89 mg/dL (ref 0.70–1.25)
GFR, Est African American: 103 mL/min/{1.73_m2} (ref >=60)
GFR, Est Non African American: 89 mL/min/{1.73_m2} (ref >=60)
Globulin: 2.4 g/dL (calc) (ref 1.9–3.7)
Glucose, Bld: 117 mg/dL — ABNORMAL HIGH (ref 65–99)
Potassium: 3.7 mmol/L (ref 3.5–5.3)
Sodium: 139 mmol/L (ref 135–146)
Total Bilirubin: 0.7 mg/dL (ref 0.2–1.2)
Total Protein: 7 g/dL (ref 6.1–8.1)

## 2021-05-18 LAB — POCT URINALYSIS DIPSTICK
Appearance: NEGATIVE
Bilirubin, UA: NEGATIVE
Blood, UA: NEGATIVE
Glucose, UA: NEGATIVE
Ketones, UA: NEGATIVE
Leukocytes, UA: NEGATIVE
Nitrite, UA: NEGATIVE
Odor: NEGATIVE
Protein, UA: NEGATIVE
Spec Grav, UA: 1.02 (ref 1.010–1.025)
Urobilinogen, UA: 0.2 E.U./dL
pH, UA: 6.5 (ref 5.0–8.0)

## 2021-05-18 NOTE — Progress Notes (Signed)
Subjective:    Patient ID: [name] [name], male    DOB: Aug 03, 1956, 65 y.o.   MRN: [id number]  HPI 65 year old Male for health maintenance exam and Medicare wellness visit.  History of  hypertension and type 2 diabetes mellitus recently diagnosed with coronary artery disease with coronary calcium score 587.  Right coronary artery showed a 1 to 24% stenosis.  Heavy calcification was noted in the LAD.  Aortic atherosclerosis present.  Patient was subsequently started on Imdur and Toprol.  History of cardiac murmur and TTE February 2022 showed mild aortic stenosis with mean gradient 55mmHg with normal ejection fraction.  Had noticed some chest tightness with some shortness of breath.  Underwent cardiac catheterization to see if the lesion in the mid to distal LAD was of concern.  Patient had this procedure in April by Dr. Tamala Julian.  Patient had right dominant coronary anatomy.  Had luminal irregularities in the LAD and widely patent coronary arteries without obstructive disease.  Lipid panel is now normal.  Glucose fasting is 117.  Hemoglobin A1c improved from 6.6 in February to 5.9%.  He had a total right hip arthroplasty in May 2020 by Dr. Ninfa Linden.  History of recurrent bouts of diverticulitis.  History of irritable bowel syndrome controlled with generic Xanax up to 3 times daily.  Last colonoscopy was in 2005.  Patient reminded that this is past due.  Findings included diverticulosis.  In the fall 2019 he suffered an injury to the right median nerve and sprained his left wrist when the horse he was riding jumped suddenly injuring his hands.  It took some time for him to improve.  He saw Dr. Leanora Cover.  Social history: Non-smoker.  Social alcohol consumption.  Married.  Wife retired from [employer].  1 adult son from previous marriage.  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  Family history: Father died with  complications of prostate cancer.  Mother died with complications of dementia.  1 brother.  Patient had Cardiolite study October 2003 which was negative.  Tetanus immunization given today.  Had pneumococcal 23 vaccine in 2012.  Gets annual flu vaccine.  Review of Systems no new complaints     Objective:   Physical Exam Blood pressure 120/80 pulse 60 pulse oximetry 97% weight 226 pounds height 6 feet 0 inches BMI 30.65  Skin: Warm and dry.  Nodes none.  TMs clear.  Neck supple.  No carotid bruits.  Chest clear to auscultation.  Cardiac exam: Regular rate and rhythm without ectopy.  Abdomen soft nondistended without hepatosplenomegaly masses or tenderness.  Prostate exam is normal.  Neuro: No focal deficits.  No lower extremity pitting edema.  Affect thought and judgment are normal.       Assessment & Plan:  History of recurrent bouts of diverticulitis and history of diverticulosis.  Colonoscopy is overdue and he was reminded about this.  Type 2 diabetes mellitus-stable  Coronary artery disease-followed by cardiologist.  Needs to reduce risk factors and keep diabetes under good control as well as lipids.  Essential hypertension-stable on current regimen  Health maintenance-tetanus update given today  History of irritable bowel syndrome treated with Xanax daily  History of right hip arthroplasty  Mixed hyperlipidemia  Elevated BMI 30.6  Plan: Needs to have colonoscopy.  Needs COVID up-to-date.  Needs to modify risk factors for coronary artery disease and keep blood pressure and diabetes under good control.  Continue Xanax for irritable bowel syndrome.  Follow-up in 6 months.  Needs to lose weight.   Subjective:   Patient presents for Medicare Annual/Subsequent preventive examination.  Review Past Medical/Family/Social: See above   Risk Factors  Current exercise habits: Horseback riding and working around farm Dietary issues discussed: yes  Cardiac risk factors:  Diabetes and hyperlipidemia  Depression Screen  (Note: if answer to either of the following is "Yes", a more complete depression screening is indicated)   Over the past two weeks, have you felt down, depressed or hopeless? No  Over the past two weeks, have you felt little interest or pleasure in doing things? No Have you lost interest or pleasure in daily life? No Do you often feel hopeless? No Do you cry easily over simple problems? No   Activities of Daily Living  In your present state of health, do you have any difficulty performing the following activities?:   Driving? No  Managing money? No  Feeding yourself? No  Getting from bed to chair? No  Climbing a flight of stairs? No  Preparing food and eating?: No  Bathing or showering? No  Getting dressed: No  Getting to the toilet? No  Using the toilet:No  Moving around from place to place: No  In the past year have you fallen or had a near fall?:No  Are you sexually active?  Yes Do you have more than one partner? No   Hearing Difficulties: Yes Do you often ask people to speak up or repeat themselves?  Yes Do you experience ringing or noises in your ears? No  Do you have difficulty understanding soft or whispered voices? No  Do you feel that you have a problem with memory?  Sometimes Do you often misplace items? No    Home Safety:  Do you have a smoke alarm at your residence? Yes Do you have grab bars in the bathroom?  No Do you have throw rugs in your house?  Yes   Cognitive Testing  Alert? Yes Normal Appearance?Yes  Oriented to person? Yes Place? Yes  Time? Yes  Recall of three objects? Yes  Can perform simple calculations? Yes  Displays appropriate judgment?Yes  Can read the correct time from a watch face?Yes   List the Names of Other Physician/Practitioners you currently use:  See referral list for the physicians patient is currently seeing.  [facility] cardiology   Review of Systems: See above   Objective:      General appearance: Appears stated age and mildly obese  Head: Normocephalic, without obvious abnormality, atraumatic  Eyes: conj clear, EOMi PEERLA  Ears: normal TM's and external ear canals both ears  Nose: Nares normal. Septum midline. Mucosa normal. No drainage or sinus tenderness.  Throat: lips, mucosa, and tongue normal; teeth and gums normal  Neck: no adenopathy, no carotid bruit, no JVD, supple, symmetrical, trachea midline and thyroid not enlarged, symmetric, no tenderness/mass/nodules  No CVA tenderness.  Lungs: clear to auscultation bilaterally  Breasts: normal appearance, no masses or tenderness Heart: regular rate and rhythm, S1, S2 normal, no murmur, click, rub or gallop  Abdomen: soft, non-tender; bowel sounds normal; no masses, no organomegaly  Musculoskeletal: ROM normal in all joints, no crepitus, no deformity, Normal muscle strengthen. Back  is symmetric, no curvature. Skin: Skin color, texture, turgor normal. No rashes or lesions  Lymph nodes: Cervical, supraclavicular, and axillary nodes normal.  Neurologic: CN 2 -12 Normal, Normal symmetric reflexes. Normal coordination and gait  Psych: Alert &  Oriented x 3, Mood appear stable.    Assessment:    Annual wellness medicare exam   Plan:    During the course of the visit the patient was educated and counseled about appropriate screening and preventive services including:   Needs colonoscopy     Patient Instructions (the written plan) was given to the patient.  Medicare Attestation  I have personally reviewed:  The patient's medical and social history  Their use of alcohol, tobacco or illicit drugs  Their current medications and supplements  The patient's functional ability including ADLs,fall risks, home safety risks, cognitive, and hearing and visual impairment  Diet and physical activities  Evidence for depression or mood disorders  The patient's weight, height, BMI, and visual acuity have been  recorded in the chart. I have made referrals, counseling, and provided education to the patient based on review of the above and I have provided the patient with a written personalized care plan for preventive services.

## 2021-06-10 ENCOUNTER — Other Ambulatory Visit: Payer: Self-pay | Admitting: Internal Medicine

## 2021-06-10 MED ORDER — GLIPIZIDE 10 MG PO TABS
10.0000 mg | ORAL_TABLET | Freq: Every day | ORAL | 1 refills | Status: DC
Start: 2021-06-10 — End: 2021-11-09

## 2021-07-03 NOTE — Patient Instructions (Signed)
Continue with aggressive management of coronary artery disease with watching diet, exercise, blood pressure and diabetic control.  Return in 6 months.  Need to have colonoscopy.  Tetanus immunization given today.

## 2021-07-21 ENCOUNTER — Ambulatory Visit (INDEPENDENT_AMBULATORY_CARE_PROVIDER_SITE_OTHER): Payer: Medicare Other | Admitting: Internal Medicine

## 2021-07-21 ENCOUNTER — Encounter: Payer: Self-pay | Admitting: Internal Medicine

## 2021-07-21 ENCOUNTER — Other Ambulatory Visit: Payer: Self-pay

## 2021-07-21 VITALS — Temp 98.6°F | Ht 72.0 in | Wt 231.0 lb

## 2021-07-21 DIAGNOSIS — E119 Type 2 diabetes mellitus without complications: Secondary | ICD-10-CM | POA: Diagnosis not present

## 2021-07-21 DIAGNOSIS — I10 Essential (primary) hypertension: Secondary | ICD-10-CM

## 2021-07-21 DIAGNOSIS — I2510 Atherosclerotic heart disease of native coronary artery without angina pectoris: Secondary | ICD-10-CM

## 2021-07-21 DIAGNOSIS — H6692 Otitis media, unspecified, left ear: Secondary | ICD-10-CM

## 2021-07-21 DIAGNOSIS — N529 Male erectile dysfunction, unspecified: Secondary | ICD-10-CM | POA: Diagnosis not present

## 2021-07-21 DIAGNOSIS — Z6831 Body mass index (BMI) 31.0-31.9, adult: Secondary | ICD-10-CM

## 2021-07-21 MED ORDER — LEVOFLOXACIN 500 MG PO TABS: 500.0000 mg | ORAL_TABLET | Freq: Every day | ORAL | 1 refills | Status: AC

## 2021-07-21 MED ORDER — TADALAFIL 20 MG PO TABS: 10.0000 mg | ORAL_TABLET | ORAL | 11 refills | Status: DC | PRN

## 2021-07-21 NOTE — Progress Notes (Signed)
   Subjective:    Patient ID: [name] [name], male    DOB: [birth year], 65 y.o.   MRN: [id number]  HPI 65 year old Male has had left ear pain for several days. No recent URI. No sore throat, fever, chills, myalgias, dysgeusia.  Tested at home and was negative for COVID-19.  Did have recent airplane travel.  No issues with right ear.  No sore throat.  No discolored sputum.  No cough.  Patient has a history of hypertension and type 2 diabetes mellitus.  Coronary artery disease followed by Dr. Johnsie Cancel.  Also, patient is asking if some medication he is taking can be contributing to erectile dysfunction.  Could make combination of antihypertensive medications and beta-blocker.  I have prescribed Cialis 20 mg to take 1/2 to 1 tablet 1 hour before intercourse.   Review of Systems no shortness of breath or chest pain     Objective:   Physical Exam Afebrile. NAD , weight 231 pounds Pharynx slightly injected without exudate.  Right TM clear.  Right external canal is clear.  Left external canal was clear.  Left TM injected and full.  Neck is supple.      Assessment & Plan:  Acute left otitis media  Erectile dysfunction  Essential hypertension-stable  Hyperlipidemia treated with statin  Type 2 diabetes mellitus  Coronary artery disease followed by Dr. Johnsie Cancel  Weight gain of 5 pounds since May 2022  Plan: Levaquin 500 mg daily for 7 days with 1 refill.  Cialis 20 mg tablets to take 1/2 to 1 tablet 1 hour before intercourse as needed. Watch diet and exercise

## 2021-07-21 NOTE — Patient Instructions (Signed)
Take Levaquin 500 mg daily for 7 days.  Cialis 20 mg to take 1/2 to 1 tablet 1 hour before intercourse as needed.  Continue to watch diet closely and get regular exercise.

## 2021-07-22 NOTE — Progress Notes (Signed)
Cardiology Office Note   Date:  07/26/2021   ID:  [name] [name], DOB 24-Jun-1956, MRN [id number]  PCP:  [name] Showers, [name]  Cardiologist:  Dr.   Laurel [name] Complaint  Patient presents with   Follow-up      History of Present Illness: [name] [name] is a 65 y.o. male who presents for follow up, seen for [name] [name].   [name] [name] has a hx of DM2, HLD, anxiety and longstanding murmur   He underwent an ETT 01/13/2012 that was normal.   He was recently seen in follow up with [name] [name] at which time he had c/o worsening exertional dyspnea with come chest tightness. Plan was to schedule a CCTA for further evaluation of his symptoms. CTA 03/25/21 showed mild to moderate mixed stenoses, primarily of the proximal LAD with heavy calcification. Imaging sent for [facility] analysis that demonstrated significant stenosis of the mid to distal LAD with a discrete drop in FFR to 0.69. He subsequently underwent LHC that showed widely patent coronary vessels with LVEF at 55% and LVEDP at 73mHg which could have explained his DOE. He has been on HCTZ.    He was seen in follow up with his PCP for acute left otitis media and was placed on Levaquin '500mg'$  QD for 7 days.   Today he is here alone and reports that he has been doing well since his cath. Given no coronary disease we discussed discontinuing his nitrate. He has has been having issues with ED and was prescribed Cialis. He reports that he had no issues prior to starting metoprolol succinate therefore we trial off the medication. He continues to have SOB with exertion but thinks this is related to the heat and his weight. He has had a 20lb weight gain in the last two months. Denies chest pain, LE edema, palpitations, dizziness, or syncope.   Past Medical History:  Diagnosis Date   Anxiety    mild   Arthritis    Rt hip   [place]   Skin Rt hand. Dr. JRonnald [name]  Diabetes mellitus without complication ([place]    GERD (gastroesophageal reflux disease)     Tums or omeprozole   History of IBS    History of kidney stones    30 years ago   Hypertension 2009   Meds controlled    Past Surgical History:  Procedure Laterality Date   COLONOSCOPY     has IBS   LEFT HEART CATH AND CORONARY ANGIOGRAPHY N/A 04/12/2021   Procedure: LEFT HEART CATH AND CORONARY ANGIOGRAPHY;  Surgeon: [name] [name];  Location: [name];  Service: Cardiovascular;  Laterality: N/A;   renal stone     TOTAL HIP ARTHROPLASTY Right 05/10/2019   Procedure: RIGHT TOTAL HIP ARTHROPLASTY ANTERIOR APPROACH;  Surgeon: [name] Rossetti [name];  Location: [facility];  Service: Orthopedics;  Laterality: Right;     Current Outpatient Medications  Medication Sig Dispense Refill   Accu-Chek FastClix Lancets MISC USE TO TEST BLOOD GLUCOSE BEFORE BREAKFAST AND SUPPER 102 each prn   ACCU-CHEK GUIDE test strip USE TO TEST BLOOD GLUCOSE BEFORE BREAKFAST AND SUPPER 100 strip PRN   ALPRAZolam (XANAX) 0.5 MG tablet TAKE 1 TABLET BY MOUTH THREE TIMES A DAY 90 tablet 5   aspirin EC 81 MG tablet Take 81 mg by mouth daily. Swallow whole.     glipiZIDE (GLUCOTROL) 10 MG tablet Take 1 tablet (10 mg total) by mouth daily before breakfast.  90 tablet 1   hydrochlorothiazide (HYDRODIURIL) 25 MG tablet TAKE 1 TABLET BY MOUTH EVERY DAY WITH LOSARTAN 90 tablet 1   levofloxacin (LEVAQUIN) 500 MG tablet Take 1 tablet (500 mg total) by mouth daily for 7 days. 7 tablet 1   olmesartan (BENICAR) 20 MG tablet Take 20 mg by mouth daily.     rosuvastatin (CRESTOR) 5 MG tablet TAKE 1 TABLET BY MOUTH EVERY DAY (Patient taking differently: Take 5 mg by mouth daily.) 90 tablet 1   tadalafil (CIALIS) 20 MG tablet Take 0.5-1 tablets (10-20 mg total) by mouth every other day as needed for erectile dysfunction. 10 tablet 11   diphenhydrAMINE (BENADRYL) 50 MG tablet Take one tablet by mouth when leaving home prior to procedure, and take with last dose of Prednisone 1 tablet 0   No current  facility-administered medications for this visit.    Allergies:   Iohexol and Shellfish allergy    Social History:  The patient  reports that he has never smoked. He has never used smokeless tobacco. He reports current alcohol use of about 2.0 - 3.0 standard drinks of alcohol per week. He reports that he does not use drugs.   Family History:  The patient's family history includes Cancer in his father.    ROS:  Please see the history of present illness. Otherwise, review of systems are positive for none.   All other systems are reviewed and negative.    PHYSICAL EXAM: VS:  BP 124/78   Pulse 73   Ht '6\' 1"'$  (1.854 m)   Wt 231 lb 12.8 oz (105.1 kg)   SpO2 97%   BMI 30.58 kg/m  , BMI Body mass index is 30.58 kg/m.  General: Well developed, well nourished, NAD Neck: Negative for carotid bruits. No JVD Lungs:Clear to ausculation bilaterally.  Breathing is unlabored. Cardiovascular: RRR with S1 S2. No murmurs Extremities: No edema. Neuro: Alert and oriented. No focal deficits. No facial asymmetry. MAE spontaneously. Psych: Responds to questions appropriately with normal affect.    EKG:  EKG is not ordered today.   Recent Labs: 05/17/2021: ALT 29; BUN 20; Creat 0.89; Hemoglobin 14.5; Platelets 159; Potassium 3.7; Sodium 139    Lipid Panel    Component Value Date/Time   CHOL 136 05/17/2021 1143   TRIG 124 05/17/2021 1143   HDL 48 05/17/2021 1143   CHOLHDL 2.8 05/17/2021 1143   VLDL 38 (H) 08/16/2016 1147   LDLCALC 67 05/17/2021 1143      Wt Readings from Last 3 Encounters:  07/26/21 231 lb 12.8 oz (105.1 kg)  07/21/21 231 lb (104.8 kg)  05/18/21 226 lb (102.5 kg)    Other studies Reviewed: Additional studies/ records that were reviewed today include: . Review of the above records demonstrates:   CTA 03/25/21:  IMPRESSION: 1. Mild to moderate mixed stenoses, primarily of the proximal LAD with heavy calcification, CADRADS = 3. Suspect non-obstructive, but will send  CT FFR and report separately.   2. Coronary calcium score of 587. This was 83rd percentile for age and sex matched control.   3. Normal coronary origin with right dominance.   4. Trileaflet aortic valve with mild leaflet calcifications.   ASSESSMENT AND PLAN:  1. DOE: -Minimal CAD on cath>>continues to have DOE however feel this may be related to his weight and/or heat/humidity  -Discussed weight management technics -Imdur d/c>>will d/c Toprol given complaints of ED wound the time he was started on beta blocker>>hold for now and follow symptoms  -  BP stable   2. DM2: -Follows closely with PCP   3. HLD: -Continue current regimen  -Lipids per PCP   4. Mild aortic stenosis: -Per echocardiogram 02/02/21 with a mean gradient at 10, peak at 23.  -Plan is for annual echo>scheduled for 01/2022  5. ED: -Reports symptoms onset around the time he started BB.  -Hold Toprol and follow symptoms -No longer on Imdur>>discussed contraindication to use both at once.    Current medicines are reviewed at length with the patient today.  The patient does not have concerns regarding medicines.  The following changes have been made:  d/c Toprol, Imdur  Labs/ tests ordered today include: None  No orders of the defined types were placed in this encounter.    Disposition:   FU with [name] [name] in 6 months  Signed, Kathyrn Drown, NP  07/26/2021 12:02 PM    [address], [place], [place]  [address] Phone: [phone]; Fax: [phone]

## 2021-07-26 ENCOUNTER — Other Ambulatory Visit: Payer: Self-pay

## 2021-07-26 ENCOUNTER — Encounter: Payer: Self-pay | Admitting: Cardiology

## 2021-07-26 ENCOUNTER — Ambulatory Visit (INDEPENDENT_AMBULATORY_CARE_PROVIDER_SITE_OTHER): Payer: Medicare Other | Admitting: Cardiology

## 2021-07-26 VITALS — BP 124/78 | HR 73 | Ht 73.0 in | Wt 231.8 lb

## 2021-07-26 DIAGNOSIS — I10 Essential (primary) hypertension: Secondary | ICD-10-CM

## 2021-07-26 DIAGNOSIS — E782 Mixed hyperlipidemia: Secondary | ICD-10-CM | POA: Diagnosis not present

## 2021-07-26 DIAGNOSIS — I350 Nonrheumatic aortic (valve) stenosis: Secondary | ICD-10-CM | POA: Diagnosis not present

## 2021-07-26 DIAGNOSIS — R0600 Dyspnea, unspecified: Secondary | ICD-10-CM | POA: Diagnosis not present

## 2021-07-26 DIAGNOSIS — R0609 Other forms of dyspnea: Secondary | ICD-10-CM

## 2021-07-26 DIAGNOSIS — N529 Male erectile dysfunction, unspecified: Secondary | ICD-10-CM

## 2021-07-26 NOTE — Patient Instructions (Signed)
Medication Instructions:  Your physician has recommended you make the following change in your medication:  STOP TOPROL  STOP IMDUR  *If you need a refill on your cardiac medications before your next appointment, please call your pharmacy*   Lab Work: NONE If you have labs (blood work) drawn today and your tests are completely normal, you will receive your results only by: [place] (if you have MyChart) OR A paper copy in the mail If you have any lab test that is abnormal or we need to change your treatment, we will call you to review the results.   Testing/Procedures: NONE   Follow-Up: At [facility], you and your health needs are our priority.  As part of our continuing mission to provide you with exceptional heart care, we have created designated Provider Care Teams.  These Care Teams include your primary Cardiologist (physician) and Advanced Practice Providers (APPs -  Physician Assistants and Nurse Practitioners) who all work together to provide you with the care you need, when you need it.  We recommend signing up for the patient portal called "MyChart".  Sign up information is provided on this After Visit Summary.  MyChart is used to connect with patients for Virtual Visits (Telemedicine).  Patients are able to view lab/test results, encounter notes, upcoming appointments, etc.  Non-urgent messages can be sent to your provider as well.   To learn more about what you can do with MyChart, go to [email or web address].    Your next appointment:   4-6 month(s)  The format for your next appointment:   In Person  Provider:   Jenkins Rouge, MD

## 2021-08-14 ENCOUNTER — Other Ambulatory Visit: Payer: Self-pay | Admitting: Internal Medicine

## 2021-08-23 ENCOUNTER — Other Ambulatory Visit: Payer: Self-pay

## 2021-08-23 MED ORDER — OLMESARTAN MEDOXOMIL 20 MG PO TABS: 20.0000 mg | ORAL_TABLET | Freq: Every day | ORAL | 3 refills | Status: DC

## 2021-08-23 MED ORDER — ALPRAZOLAM 0.5 MG PO TABS: ORAL_TABLET | ORAL | 5 refills | Status: DC

## 2021-08-23 NOTE — Telephone Encounter (Signed)
Last refill 02/19/21

## 2021-08-24 ENCOUNTER — Encounter: Payer: Self-pay | Admitting: Internal Medicine

## 2021-08-31 ENCOUNTER — Telehealth: Payer: Self-pay

## 2021-08-31 NOTE — Telephone Encounter (Signed)
Patient called complaining of tingling and burning on the left side of his face.  Denies any smile changes.  This started yesterday.  Please advise.

## 2021-10-30 ENCOUNTER — Other Ambulatory Visit: Payer: Self-pay | Admitting: Internal Medicine

## 2021-11-01 ENCOUNTER — Other Ambulatory Visit: Payer: Self-pay | Admitting: Internal Medicine

## 2021-11-08 ENCOUNTER — Other Ambulatory Visit: Payer: Medicare Other | Admitting: Internal Medicine

## 2021-11-08 ENCOUNTER — Other Ambulatory Visit: Payer: Self-pay

## 2021-11-08 DIAGNOSIS — E119 Type 2 diabetes mellitus without complications: Secondary | ICD-10-CM

## 2021-11-08 DIAGNOSIS — E1169 Type 2 diabetes mellitus with other specified complication: Secondary | ICD-10-CM

## 2021-11-08 DIAGNOSIS — E782 Mixed hyperlipidemia: Secondary | ICD-10-CM

## 2021-11-09 ENCOUNTER — Ambulatory Visit
Admission: RE | Admit: 2021-11-09 | Discharge: 2021-11-09 | Disposition: A | Discharge status: Home / Self Care | Payer: Medicare Other | Source: Ambulatory Visit | Attending: Internal Medicine | Admitting: Internal Medicine

## 2021-11-09 ENCOUNTER — Encounter: Payer: Self-pay | Admitting: Internal Medicine

## 2021-11-09 ENCOUNTER — Ambulatory Visit (INDEPENDENT_AMBULATORY_CARE_PROVIDER_SITE_OTHER): Payer: Medicare Other | Admitting: Internal Medicine

## 2021-11-09 VITALS — BP 128/70 | HR 82 | Temp 98.2°F | Ht 73.0 in | Wt 232.0 lb

## 2021-11-09 DIAGNOSIS — M79672 Pain in left foot: Secondary | ICD-10-CM

## 2021-11-09 DIAGNOSIS — E1169 Type 2 diabetes mellitus with other specified complication: Secondary | ICD-10-CM

## 2021-11-09 DIAGNOSIS — I10 Essential (primary) hypertension: Secondary | ICD-10-CM

## 2021-11-09 DIAGNOSIS — K580 Irritable bowel syndrome with diarrhea: Secondary | ICD-10-CM

## 2021-11-09 DIAGNOSIS — R0781 Pleurodynia: Secondary | ICD-10-CM

## 2021-11-09 DIAGNOSIS — Z8659 Personal history of other mental and behavioral disorders: Secondary | ICD-10-CM

## 2021-11-09 DIAGNOSIS — Z1211 Encounter for screening for malignant neoplasm of colon: Secondary | ICD-10-CM | POA: Diagnosis not present

## 2021-11-09 DIAGNOSIS — Z23 Encounter for immunization: Secondary | ICD-10-CM | POA: Diagnosis not present

## 2021-11-09 DIAGNOSIS — I2510 Atherosclerotic heart disease of native coronary artery without angina pectoris: Secondary | ICD-10-CM

## 2021-11-09 DIAGNOSIS — N529 Male erectile dysfunction, unspecified: Secondary | ICD-10-CM

## 2021-11-09 DIAGNOSIS — M79671 Pain in right foot: Secondary | ICD-10-CM

## 2021-11-09 DIAGNOSIS — E782 Mixed hyperlipidemia: Secondary | ICD-10-CM

## 2021-11-09 LAB — LIPID PANEL
Cholesterol: 137 mg/dL (ref <200)
HDL: 49 mg/dL (ref >=40)
LDL Cholesterol (Calc): 64 mg/dL (calc)
Non-HDL Cholesterol (Calc): 88 mg/dL (calc) (ref <130)
Total CHOL/HDL Ratio: 2.8 (calc) (ref <5.0)
Triglycerides: 159 mg/dL — ABNORMAL HIGH (ref <150)

## 2021-11-09 LAB — COMPLETE METABOLIC PANEL WITH GFR
AG Ratio: 1.9 (calc) (ref 1.0–2.5)
ALT: 47 U/L — ABNORMAL HIGH (ref 9–46)
AST: 34 U/L (ref 10–35)
Albumin: 4.7 g/dL (ref 3.6–5.1)
Alkaline phosphatase (APISO): 47 U/L (ref 35–144)
BUN: 16 mg/dL (ref 7–25)
CO2: 28 mmol/L (ref 20–32)
Calcium: 9.3 mg/dL (ref 8.6–10.3)
Chloride: 101 mmol/L (ref 98–110)
Creat: 0.98 mg/dL (ref 0.70–1.35)
Globulin: 2.5 g/dL (calc) (ref 1.9–3.7)
Glucose, Bld: 139 mg/dL — ABNORMAL HIGH (ref 65–99)
Potassium: 4 mmol/L (ref 3.5–5.3)
Sodium: 139 mmol/L (ref 135–146)
Total Bilirubin: 0.8 mg/dL (ref 0.2–1.2)
Total Protein: 7.2 g/dL (ref 6.1–8.1)
eGFR: 85 mL/min/{1.73_m2} (ref >=60)

## 2021-11-09 LAB — HEMOGLOBIN A1C
Hgb A1c MFr Bld: 6.2 % of total Hgb — ABNORMAL HIGH (ref <5.7)
Mean Plasma Glucose: 131 mg/dL
eAG (mmol/L): 7.3 mmol/L

## 2021-11-09 MED ORDER — SILDENAFIL CITRATE 100 MG PO TABS: 50.0000 mg | ORAL_TABLET | Freq: Every day | ORAL | 11 refills | Status: DC | PRN

## 2021-11-09 MED ORDER — SITAGLIPTIN PHOSPHATE 50 MG PO TABS: 50.0000 mg | ORAL_TABLET | Freq: Every day | ORAL | 0 refills | Status: DC

## 2021-11-09 MED ORDER — HYDROCODONE-ACETAMINOPHEN 5-325 MG PO TABS
ORAL_TABLET | ORAL | 0 refills | Status: DC
Start: 2021-11-09 — End: 2022-01-25

## 2021-11-09 MED ORDER — BUPROPION HCL ER (XL) 150 MG PO TB24: 150.0000 mg | ORAL_TABLET | Freq: Every day | ORAL | 1 refills | Status: DC

## 2021-11-09 NOTE — Progress Notes (Signed)
   Subjective:    Patient ID: [name] [name], male    DOB: 1956-03-16, 65 y.o.   MRN: [id number]  HPI 65 year old male seen for follow up on Diabetes mellitus, HTN and hyperlipidemia.  Hemoglobin A1c is 6.2% and previously was 5.9% in May, lipid panel is normal except for triglycerides of 159 which is much better than November 2021 and he is on statin therapy.  He may not be getting enough exercise and he may not be watching his diet quite as strictly as he should.  His fasting glucose is 139, BUN and creatinine are normal.  SGOT is normal.  SGPT very slightly elevated at 47 normal being between 9 and 46.    Review of Systems He has been traveling some for work.  He is having severe right rib pain.  Denies any trauma or injury to that area.  Will have rib detail films.  Have given him a small quantity of hydrocodone APAP to take sparingly for right rib cage pain.     Objective:   Physical Exam Blood pressure 128/70 pulse 82 temperature 98.2 pulse oximetry 95% weight 232 pounds height 6 feet 1 inches BMI 30.61.  Would like to see his BMI more like 25-27.  He was encouraged to watch his diet and get more exercise on a regular basis.  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 Skin is warm and dry.  No carotid bruits.  No thyromegaly.  Chest is clear to auscultation. Cardiac exam: Regular rate and rhythm without ectopy.  No lower extremity pitting edema.  He is tender along right rib cage area and lateral aspect     Assessment & Plan:  Musculoskeletal pain right lateral rib cage area-to have x-ray to rule out lytic lesion or fracture.  He will take hydrocodone APAP sparingly for pain.  Type 2 diabetes mellitus-try Januvia 50 mg daily.  He has been having issues getting glipizide from his pharmacy.  Hypertension continue with Benicar 20 mg daily  Anxiety takes Xanax 0.5 mg up to 3 times daily if needed  History of  depression treated with Wellbutrin 150 mg daily  Erectile dysfunction treated with Viagra and this was refilled  Having some foot issues and recommended Dr. Andreas Blower for consultation regarding orthotics  Plan: Return in early January for follow-up on diabetes mellitus.  Flu vaccine given.  X-rays are negative for occult fracture of rib cage.  Referral for colonoscopy

## 2021-11-09 NOTE — Patient Instructions (Signed)
Stop Glipizide.  Take Januvia 50 mg daily instead.  Recommend Dr. Andreas Blower for consultation regarding orthotics.  Flu vaccine given.  Have right rib detail films.  May take hydrocodone APAP 5/325 sparingly for right rib cage pain.  Continue to work with diet and exercise efforts

## 2021-12-29 ENCOUNTER — Other Ambulatory Visit: Payer: Self-pay | Admitting: Internal Medicine

## 2021-12-30 ENCOUNTER — Other Ambulatory Visit: Payer: Self-pay

## 2021-12-30 ENCOUNTER — Other Ambulatory Visit: Payer: Medicare Other | Admitting: Internal Medicine

## 2021-12-30 DIAGNOSIS — E119 Type 2 diabetes mellitus without complications: Secondary | ICD-10-CM

## 2021-12-30 NOTE — Progress Notes (Signed)
Efer

## 2021-12-31 ENCOUNTER — Encounter: Payer: Self-pay | Admitting: Internal Medicine

## 2021-12-31 ENCOUNTER — Ambulatory Visit (INDEPENDENT_AMBULATORY_CARE_PROVIDER_SITE_OTHER): Payer: Medicare Other | Admitting: Internal Medicine

## 2021-12-31 VITALS — BP 138/82 | HR 72 | Temp 98.1°F | Ht 73.0 in | Wt 221.0 lb

## 2021-12-31 DIAGNOSIS — Z8719 Personal history of other diseases of the digestive system: Secondary | ICD-10-CM

## 2021-12-31 DIAGNOSIS — K580 Irritable bowel syndrome with diarrhea: Secondary | ICD-10-CM

## 2021-12-31 DIAGNOSIS — E1169 Type 2 diabetes mellitus with other specified complication: Secondary | ICD-10-CM | POA: Diagnosis not present

## 2021-12-31 DIAGNOSIS — I10 Essential (primary) hypertension: Secondary | ICD-10-CM

## 2021-12-31 DIAGNOSIS — E782 Mixed hyperlipidemia: Secondary | ICD-10-CM

## 2021-12-31 DIAGNOSIS — M79671 Pain in right foot: Secondary | ICD-10-CM

## 2021-12-31 DIAGNOSIS — Z8659 Personal history of other mental and behavioral disorders: Secondary | ICD-10-CM

## 2021-12-31 LAB — HEMOGLOBIN A1C
Hgb A1c MFr Bld: 5.9 % of total Hgb — ABNORMAL HIGH (ref <5.7)
Mean Plasma Glucose: 123 mg/dL
eAG (mmol/L): 6.8 mmol/L

## 2021-12-31 NOTE — Patient Instructions (Addendum)
He has decided to follow up with  GI.Last colonoscopy 2005. No recent bouts of diverticultitis. Referral to be placed.   Has foot pain and will see Dr. Oneida Alar soon regarding orthotics. Uric acid checked today to check for possible gout.   Diabetes improved with diet. Says Januvia is expensive. I thought it had gone generic. Refer to Endocrinology for optimal management.  He has chronic left shoulder pain and will see Orthopedist.  Has lost weight and is watching diet better.  Medicare wellness due late May or early June.

## 2021-12-31 NOTE — Progress Notes (Signed)
° °  Subjective:    Patient ID: [name] [name], male    DOB: September 26, 1956, 66 y.o.   MRN: [id number]  HPI 66 year old  Male here for follow up. Is now on Januvia. Says 3 month supply was $200 but AIC improved from 6.2 to 5.9 %. Has been watching diet and has lost 11 pounds since November.   Has appt to see Dr. Oneida Alar about chronic foor pain and ? Need for orthotics. Uric acid checked today.   Has chronic left shoulder pain and will see ortho soon. 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    Review of Systems see above- says he has remnants of URI with protracted cough. Can place on steroids but that will increase glucose temporarily.     Objective:   Physical Exam  Was 226 pounds in May, Was 232 pounds in November, and now 221 pounds  Chest is clear to auscultation.  Cardiac exam regular rate and rhythm.     Assessment & Plan:  Type 2 diabetes mellitus improved with Januvia but patient says it is expensive.  Referral to endocrinologist to optimize diabetic management with history of heart disease.  Right foot pain-appointment to see Dr. Andreas Blower regarding foot pain and possible orthotics.  Serum uric acid checked today.  Left shoulder pain-needs to see orthopedist  Essential hypertension-stable on current regimen  Obesity-has lost 11 pounds since November.  Continue to watch diet exercise and alcohol intake.  Coronary disease-has follow-up with Dr.Nishan soon.  Is on Crestor 5 mg daily.  Lipid panel in November was stable except for triglycerides of 159 but marked improvement since November 2021.  Medicare wellness due late May or early June

## 2022-01-01 LAB — URIC ACID: Uric Acid, Serum: 6.2 mg/dL (ref 4.0–8.0)

## 2022-01-03 ENCOUNTER — Encounter: Payer: Self-pay | Admitting: Physician Assistant

## 2022-01-03 ENCOUNTER — Ambulatory Visit (INDEPENDENT_AMBULATORY_CARE_PROVIDER_SITE_OTHER): Payer: Medicare Other

## 2022-01-03 ENCOUNTER — Ambulatory Visit (INDEPENDENT_AMBULATORY_CARE_PROVIDER_SITE_OTHER): Payer: Medicare Other | Admitting: Physician Assistant

## 2022-01-03 ENCOUNTER — Other Ambulatory Visit: Payer: Self-pay

## 2022-01-03 DIAGNOSIS — M7542 Impingement syndrome of left shoulder: Secondary | ICD-10-CM

## 2022-01-03 MED ORDER — LIDOCAINE HCL 1 % IJ SOLN
3.0000 mL | INTRAMUSCULAR | Status: AC | PRN
  Administered 2022-01-03: 3 mL

## 2022-01-03 MED ORDER — METHYLPREDNISOLONE ACETATE 40 MG/ML IJ SUSP
40.0000 mg | INTRAMUSCULAR | Status: AC | PRN
  Administered 2022-01-03: 40 mg via INTRA_ARTICULAR

## 2022-01-03 NOTE — Progress Notes (Signed)
Office Visit Note   Patient: [name] [name]           Date of Birth: 1956-09-18           MRN: [id number] Visit Date: 01/03/2022              Requested by: Elby Showers, MD [address] [place],  [place] [address] PCP: Elby Showers, MD   Assessment & Plan: Visit Diagnoses:  1. Shoulder impingement, left     Plan: Patient shown [place], forward flexion and pendulum exercises.  He also has a gym at home and can see external and internal internal rotation exercises with a light weight using the left arm.  See him back in 2 weeks see how he is doing overall.  He canceled the appointment if he is doing well.  Questions were encouraged and answered at length.  Follow-Up Instructions: Return in about 2 weeks (around 01/17/2022).   Orders:  Orders Placed This Encounter  Procedures   Large Joint Inj   XR Shoulder Left   No orders of the defined types were placed in this encounter.     Procedures: Large Joint Inj: L subacromial bursa on 01/03/2022 11:14 AM Indications: pain Details: 22 G 1.5 in needle, superior approach  Arthrogram: No  Medications: 3 mL lidocaine 1 %; 40 mg methylPREDNISolone acetate 40 MG/ML Outcome: tolerated well, no immediate complications Procedure, treatment alternatives, risks and benefits explained, specific risks discussed. Consent was given by the patient. Immediately prior to procedure a time out was called to verify the correct patient, procedure, equipment, support staff and site/side marked as required. Patient was prepped and draped in the usual sterile fashion.      Clinical Data: No additional findings.   Subjective: Chief Complaint  Patient presents with   Left Shoulder - Pain    HPI Mr. Eckstein 66 year old male well-known to Dr. Ninfa Linden service comes in today with left shoulder pain.  States pains been ongoing for the past 6 months.  No acute injury.  History of left shoulder injury some 6 years ago.  Denies any radicular  symptoms down the left arm.  He is tried Advil.  Pain upon the shoulder girdle mostly the dorsal aspect.  He is diabetic last hemoglobin A1c was 5.7. Review of Systems Negative for fevers chills or recent vaccines.  Objective: Vital Signs: There were no vitals taken for this visit.  Physical Exam General well-developed well-nourished male no acute distress mood and affect appropriate Psych: Alert and oriented x3 Ortho Exam Bilateral shoulders 5/5 strength of external/internal rotation against resistance.  Positive impingement testing left shoulder only.  Empty can test negative bilaterally.  Liftoff test is negative bilaterally.  Full overhead activity both shoulders without significant pain. Specialty Comments:  No specialty comments available.  Imaging: XR Shoulder Left  Result Date: 01/03/2022 Left shoulder 3 views: Slight AC joint arthritic changes.  Humeral head is well located.  Glenohumeral joints well-maintained.  No significant arthritic changes otherwise.  No acute fractures.    PMFS History: Patient Active Problem List   Diagnosis Date Noted   CAD (coronary artery disease), native coronary artery 04/12/2021   Status post total replacement of right hip 05/10/2019   Unilateral primary osteoarthritis, right hip 01/24/2019   GERD (gastroesophageal reflux disease) 09/16/2013   HTN (hypertension) 06/07/2011   Irritable bowel 06/07/2011   DIVERTICULITIS-COLON 11/27/2008   Past Medical History:  Diagnosis Date   Anxiety    mild   Arthritis  Rt hip   Cancer ([place]) 04/2019   Skin Rt hand. Dr. Ronnald Ramp   Diabetes mellitus without complication ([place])    GERD (gastroesophageal reflux disease)    Tums or omeprozole   History of IBS    History of kidney stones    30 years ago   Hypertension 2009   Meds controlled    Family History  Problem Relation Age of Onset   Cancer Father     Past Surgical History:  Procedure Laterality Date   COLONOSCOPY     has IBS   LEFT HEART  CATH AND CORONARY ANGIOGRAPHY N/A 04/12/2021   Procedure: LEFT HEART CATH AND CORONARY ANGIOGRAPHY;  Surgeon: Belva Crome, MD;  Location: [facility];  Service: Cardiovascular;  Laterality: N/A;   renal stone     TOTAL HIP ARTHROPLASTY Right 05/10/2019   Procedure: RIGHT TOTAL HIP ARTHROPLASTY ANTERIOR APPROACH;  Surgeon: Mcarthur Rossetti, MD;  Location: [facility];  Service: Orthopedics;  Laterality: Right;   Social History   Occupational History   Not on file  Tobacco Use   Smoking status: Never   Smokeless tobacco: Never  Vaping Use   Vaping Use: Never used  Substance and Sexual Activity   Alcohol use: Yes    Alcohol/week: 2.0 - 3.0 standard drinks    Types: 2 - 3 Shots of liquor per week   Drug use: No   Sexual activity: Yes

## 2022-01-04 ENCOUNTER — Ambulatory Visit (HOSPITAL_COMMUNITY): Payer: Medicare Other

## 2022-01-06 ENCOUNTER — Ambulatory Visit: Payer: Medicare Other | Admitting: Sports Medicine

## 2022-01-12 ENCOUNTER — Ambulatory Visit: Payer: Medicare Other | Admitting: Cardiovascular Disease

## 2022-01-25 ENCOUNTER — Other Ambulatory Visit: Payer: Self-pay

## 2022-01-25 ENCOUNTER — Ambulatory Visit (AMBULATORY_SURGERY_CENTER): Payer: Medicare Other | Admitting: *Deleted

## 2022-01-25 VITALS — Ht 72.0 in | Wt 213.0 lb

## 2022-01-25 DIAGNOSIS — Z1211 Encounter for screening for malignant neoplasm of colon: Secondary | ICD-10-CM

## 2022-01-25 MED ORDER — CLENPIQ 10-3.5-12 MG-GM -GM/160ML PO SOLN: 1.0000 | ORAL | 0 refills | Status: DC

## 2022-01-25 NOTE — Progress Notes (Signed)
No egg or soy allergy known to patient  No issues known to pt with past sedation with any surgeries or procedures Patient denies ever being told they had issues or difficulty with intubation  No FH of Malignant Hyperthermia Pt is not on diet pills Pt is not on  home 02  Pt is not on blood thinners  Pt denies issues with constipation  No A fib or A flutter  Pt is fully vaccinated  for Covid   NO PA's for preps discussed with pt In PV today  Discussed with pt there will be an out-of-pocket cost for prep and that varies from $0 to 70 +  dollars - pt verbalized understanding   Due to the COVID-19 pandemic we are asking patients to follow certain guidelines in PV and the [place] [facility]   Pt aware of COVID protocols and [facility] guidelines   PV completed over the phone. Pt verified name, DOB, address and insurance during PV today.  Pt mailed instruction packet with copy of consent form to read and not return, and instructions.  Pt encouraged to call with questions or issues.  If pt has My chart, procedure instructions sent via My Chart  Emailed instructions to [email or web address] Pt had a terrible reaction to Miralax Gatorade - caused severe abdominal cramping, pt had to go to the ED for Morphine- Suprep, Plenvu and Golytely are contraindicated for pt due to Polyethylene glycol - gave CLenpiq- pt will call if not covered by insurance

## 2022-01-26 ENCOUNTER — Ambulatory Visit (INDEPENDENT_AMBULATORY_CARE_PROVIDER_SITE_OTHER): Payer: Medicare Other

## 2022-01-26 DIAGNOSIS — I340 Nonrheumatic mitral (valve) insufficiency: Secondary | ICD-10-CM | POA: Diagnosis not present

## 2022-01-26 DIAGNOSIS — I350 Nonrheumatic aortic (valve) stenosis: Secondary | ICD-10-CM | POA: Diagnosis not present

## 2022-01-26 LAB — ECHOCARDIOGRAM COMPLETE
AR max vel: 1.62 cm2
AV Area VTI: 1.74 cm2
AV Area mean vel: 1.47 cm2
AV Mean grad: 12.6 mmHg
AV Peak grad: 22.3 mmHg
Ao pk vel: 2.36 m/s
Area-P 1/2: 3.42 cm2
Calc EF: 63.6 %
MV VTI: 2.28 cm2
S' Lateral: 3.5 cm
Single Plane A2C EF: 63.6 %
Single Plane A4C EF: 64.4 %

## 2022-01-28 ENCOUNTER — Other Ambulatory Visit: Payer: Self-pay | Admitting: Internal Medicine

## 2022-02-01 ENCOUNTER — Ambulatory Visit (INDEPENDENT_AMBULATORY_CARE_PROVIDER_SITE_OTHER): Payer: Medicare Other | Admitting: Sports Medicine

## 2022-02-01 VITALS — BP 138/80 | Ht 72.0 in | Wt 213.0 lb

## 2022-02-01 DIAGNOSIS — M216X1 Other acquired deformities of right foot: Secondary | ICD-10-CM

## 2022-02-01 DIAGNOSIS — M7741 Metatarsalgia, right foot: Secondary | ICD-10-CM | POA: Diagnosis not present

## 2022-02-01 NOTE — Progress Notes (Signed)
PCP: Elby Showers, MD  Subjective:   HPI: Patient is a 66 y.o. male here for right plantar forefoot pain x years.   Patient presents with right foot pain on the plantar aspect underneath the second metatarsal.  He states years ago he was seen by podiatrist and they tried a cortisone injection which did not provide him any relief.  He denies any injury, the pain was insidious and has become slightly worse.  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  He has noticed a difference with the type of shoe wear he wears.  He does have a pair of sketcher shoes which provide good cushion and this gives him good relief.  He does a lot of work in his boots which do not have his greater sole support.  He denies any redness, erythema or swelling.  He has no dorsal foot pain, only localized to the plantar aspect of the second metatarsal area.  He has tried different over-the-counter orthotics which have only provided temporary relief.  Past Medical History:  Diagnosis Date   Anxiety    mild- past  hx   Arthritis    Rt hip   Cancer ([place]) 04/2019   Skin Rt hand. Dr. Ronnald Ramp   Diabetes mellitus without complication ([place])    GERD (gastroesophageal reflux disease)    Tums or omeprozole   Heart murmur    History of IBS    History of kidney stones    30 years ago   Hypertension 2009   Meds controlled    Current Outpatient Medications on File Prior to Visit  Medication Sig Dispense Refill   Accu-Chek FastClix Lancets MISC USE TO TEST BLOOD GLUCOSE BEFORE BREAKFAST AND SUPPER 102 each prn   ACCU-CHEK GUIDE test strip USE TO TEST BLOOD GLUCOSE BEFORE BREAKFAST AND SUPPER 100 strip PRN   ALPRAZolam (XANAX) 0.5 MG tablet TAKE 1 TABLET BY MOUTH THREE TIMES A DAY (Patient taking differently: Take 0.5 mg by mouth at bedtime. TAKE 1 TABLET BY MOUTH THREE TIMES A DAY) 90 tablet 5   aspirin EC 81 MG tablet Take 81 mg  by mouth daily. Swallow whole.     buPROPion (WELLBUTRIN XL) 150 MG 24 hr tablet Take 1 tablet (150 mg total) by mouth daily. 90 tablet 1   hydrochlorothiazide (HYDRODIURIL) 25 MG tablet TAKE ONE TABLET BY MOUTH ONE TIME DAILY WITH LOSARTAN 90 tablet 3   JANUVIA 50 MG tablet TAKE ONE TABLET BY MOUTH ONE TIME DAILY 90 tablet 1   olmesartan (BENICAR) 20 MG tablet Take 1 tablet (20 mg total) by mouth daily. 90 tablet 3   rosuvastatin (CRESTOR) 5 MG tablet TAKE ONE TABLET BY MOUTH ONE TIME DAILY 90 tablet 0   sildenafil (VIAGRA) 100 MG tablet Take 0.5-1 tablets (50-100 mg total) by mouth daily as needed for erectile dysfunction. 5 tablet 11   Sod Picosulfate-Mag Ox-Cit Acd (CLENPIQ) 10-3.5-12 MG-GM -GM/160ML SOLN Take 1 kit by mouth as directed. NO PA's - 320 mL 0   tadalafil (CIALIS) 20 MG tablet SMARTSIG:0.5-1 Tablet(s) By Mouth Every Other Day PRN     [DISCONTINUED] glipiZIDE (GLUCOTROL) 10 MG tablet Take 1 tablet (10 mg total) by mouth daily before breakfast. 90 tablet 1   No current facility-administered medications on file prior to visit.    Past Surgical History:  Procedure Laterality Date   COLONOSCOPY  2005  has IBS   LEFT HEART CATH AND CORONARY ANGIOGRAPHY N/A 04/12/2021   Procedure: LEFT HEART CATH AND CORONARY ANGIOGRAPHY;  Surgeon: Belva Crome, MD;  Location: [facility];  Service: Cardiovascular;  Laterality: N/A;   renal stone     TOTAL HIP ARTHROPLASTY Right 05/10/2019   Procedure: RIGHT TOTAL HIP ARTHROPLASTY ANTERIOR APPROACH;  Surgeon: Mcarthur Rossetti, MD;  Location: [facility];  Service: Orthopedics;  Laterality: Right;    Allergies  Allergen Reactions   Iohexol      Code: RASH, Desc: PER MARY SILER (TECH @ [facility])STATES PT HAD REACTION TO CONTRAST MANY YRS PRIOR. 01/05/09/RM, Onset Date: [id number]    Miralax [Polyethylene Glycol] Other (See Comments)    Caused severe abdominal pain with bowel prep ( 238 grams )    Shellfish Allergy Nausea And Vomiting     BP 138/80    Ht 6' (1.829 m)    Wt 213 lb (96.6 kg)    BMI 28.89 kg/m   [facility] Exercise 02/01/2022  Frequency of aerobic exercise (# of days/week) 6  Average time in minutes 30  Frequency of strengthening activities (# of days/week) 1    No flowsheet data found.      Objective:  Physical Exam:  Gen: Well-appearing, in no acute distress; non-toxic CV: Regular Rate. Well-perfused. Warm.  Resp: Breathing unlabored on room air; no wheezing. Psych: Fluid speech in conversation; appropriate affect; normal thought process Neuro: Sensation intact throughout. No gross coordination deficits.  MSK:  - Bilateral feet: Well-maintained longitudinal arch bilaterally; + loss of transverse arch of the right foot with splaying of toes 2-3 - Right foot: + TTP over the metatarsal and the plantar aspect of the second toe.  There is no erythema, redness or swelling.  There is a small degree of plantar callus formation over the second metatarsal.  He does have loss of the normal transverse arch as the foot is quite flattened with standing.  No plantar fascia TTP.  Range of motion about the ankle and foot full and intact.  Neurovascular intact distally.  Negative Morton's click.   Assessment & Plan:  1.  Metatarsalgia, right foot 2.  Loss of plantar arch, right foot  -Provided patient with green sports insoles with placement of medium metatarsal pad on the right insole. Patient found this to be comfortable with ambulating. -Did provide patient with a second set of green sports insoles per his request, we did give him a catalog so that he can reorder these and/or the metatarsal pads to switch out for all of his shoes. -Discussed importance of supportive shoe wear with supportive arch/insole support.  Avoid barefoot walking on hard surfaces Note after placing of MT pad he felt that this unloaded some of pressure on RT forefoot -Follow-up as needed  Elba Barman, DO PGY-4, Sports  Medicine Fellow [place]  I observed and examined the patient with the [facility] resident and agree with assessment and plan.  Note reviewed and modified by me. Ila Mcgill, MD

## 2022-02-01 NOTE — Assessment & Plan Note (Signed)
With sports insoles and a medium MT pad his gait was more comfortable He felt unloading of pressure  Will use these several months If working no f/U If not consider custom orthotic

## 2022-02-07 ENCOUNTER — Ambulatory Visit (AMBULATORY_SURGERY_CENTER): Payer: Medicare Other | Admitting: Internal Medicine

## 2022-02-07 ENCOUNTER — Encounter: Payer: Self-pay | Admitting: Internal Medicine

## 2022-02-07 VITALS — BP 131/74 | HR 57 | Temp 98.9°F | Resp 12 | Ht 72.0 in | Wt 213.0 lb

## 2022-02-07 DIAGNOSIS — Z1211 Encounter for screening for malignant neoplasm of colon: Secondary | ICD-10-CM | POA: Diagnosis not present

## 2022-02-07 DIAGNOSIS — D124 Benign neoplasm of descending colon: Secondary | ICD-10-CM

## 2022-02-07 DIAGNOSIS — D120 Benign neoplasm of cecum: Secondary | ICD-10-CM | POA: Diagnosis not present

## 2022-02-07 DIAGNOSIS — D122 Benign neoplasm of ascending colon: Secondary | ICD-10-CM

## 2022-02-07 MED ORDER — HYDROCORTISONE (PERIANAL) 2.5 % EX CREA: 1.0000 "application " | TOPICAL_CREAM | Freq: Two times a day (BID) | CUTANEOUS | 1 refills | Status: AC

## 2022-02-07 MED ORDER — SODIUM CHLORIDE 0.9 % IV SOLN: 500.0000 mL | Freq: Once | INTRAVENOUS | Status: DC

## 2022-02-07 NOTE — Progress Notes (Signed)
Sedate, gd SR, tolerated procedure well, VSS, report to RN 

## 2022-02-07 NOTE — Op Note (Signed)
[place] Patient Name: [name] [name] Procedure Date: 02/07/2022 8:45 AM MRN: [id number] Endoscopist: Sonny Masters "[name] [name] ,  Age: 66 Referring MD:  Date of Birth: Nov 29, 1956 Gender: Male Account #: [id number] Procedure:                Colonoscopy Indications:              Screening for colorectal malignant neoplasm Medicines:                Monitored Anesthesia Care Procedure:                Pre-Anesthesia Assessment:                           - Prior to the procedure, a History and Physical                            was performed, and patient medications and                            allergies were reviewed. The patient's tolerance of                            previous anesthesia was also reviewed. The risks                            and benefits of the procedure and the sedation                            options and risks were discussed with the patient.                            All questions were answered, and informed consent                            was obtained. Prior Anticoagulants: The patient has                            taken no previous anticoagulant or antiplatelet                            agents. ASA Grade Assessment: II - A patient with                            mild systemic disease. After reviewing the risks                            and benefits, the patient was deemed in                            satisfactory condition to undergo the procedure.                           After obtaining informed consent, the colonoscope  was passed under direct vision. Throughout the                            procedure, the patient's blood pressure, pulse, and                            oxygen saturations were monitored continuously. The                            Olympus CF-HQ190L (Serial# [id number]) Colonoscope was                            introduced through the anus and advanced to the the                            terminal  ileum. The colonoscopy was performed                            without difficulty. The patient tolerated the                            procedure well. The quality of the bowel                            preparation was good. The terminal ileum, ileocecal                            valve, appendiceal orifice, and rectum were                            photographed. Scope In: 9:05:23 AM Scope Out: 9:26:24 AM Scope Withdrawal Time: 0 hours 17 minutes 19 seconds  Total Procedure Duration: 0 hours 21 minutes 1 second  Findings:                 The terminal ileum appeared normal.                           Multiple small and large-mouthed diverticula were                            found in the entire colon.                           Five sessile polyps were found in the descending                            colon, ascending colon and cecum. The polyps were 2                            to 5 mm in size. These polyps were removed with a                            cold snare. Resection and retrieval were complete.  A 2 mm polyp was found in the ascending colon. The                            polyp was sessile. The polyp was removed with a                            cold biopsy forceps. Resection and retrieval were                            complete.                           Non-bleeding internal hemorrhoids were found during                            retroflexion. Complications:            No immediate complications. Estimated Blood Loss:     Estimated blood loss was minimal. Impression:               - The examined portion of the ileum was normal.                           - Diverticulosis in the entire examined colon.                           - Five 2 to 5 mm polyps in the descending colon, in                            the ascending colon and in the cecum, removed with                            a cold snare. Resected and retrieved.                           -  One 2 mm polyp in the ascending colon, removed                            with a cold biopsy forceps. Resected and retrieved.                           - Non-bleeding internal hemorrhoids. Recommendation:           - Discharge patient to home (with escort).                           - Await pathology results.                           - The findings and recommendations were discussed                            with the patient. Sonny Masters "[name] [name]" [name],  02/07/2022 9:31:05 AM

## 2022-02-07 NOTE — Progress Notes (Signed)
Pt's states no medical or surgical changes since previsit or office visit.  VS CW  

## 2022-02-07 NOTE — Progress Notes (Signed)
GASTROENTEROLOGY PROCEDURE H&P NOTE   Primary Care Physician: Elby Showers, MD    Reason for Procedure:   Colon cancer screening  Plan:    Colonoscopy  Patient is appropriate for endoscopic procedure(s) in the ambulatory ([facility]) setting.  The nature of the procedure, as well as the risks, benefits, and alternatives were carefully and thoroughly reviewed with the patient. Ample time for discussion and questions allowed. The patient understood, was satisfied, and agreed to proceed.     HPI: [name] [name] is a 66 y.o. male who presents for colonoscopy for colon cancer screening. Has had diarrhea for the last 20+ years. Occasionally sees scant amount of rectal bleeding. Denies weight loss. Denies fam hx of colon cancer.  Past Medical History:  Diagnosis Date   Anxiety    mild- past  hx   Arthritis    Rt hip   Cancer ([place]) 04/2019   Skin Rt hand. Dr. Ronnald Ramp   Diabetes mellitus without complication ([place])    GERD (gastroesophageal reflux disease)    Tums or omeprozole   Heart murmur    History of IBS    History of kidney stones    30 years ago   Hypertension 2009   Meds controlled    Past Surgical History:  Procedure Laterality Date   COLONOSCOPY  2005   has IBS   LEFT HEART CATH AND CORONARY ANGIOGRAPHY N/A 04/12/2021   Procedure: LEFT HEART CATH AND CORONARY ANGIOGRAPHY;  Surgeon: Belva Crome, MD;  Location: [facility];  Service: Cardiovascular;  Laterality: N/A;   renal stone     TOTAL HIP ARTHROPLASTY Right 05/10/2019   Procedure: RIGHT TOTAL HIP ARTHROPLASTY ANTERIOR APPROACH;  Surgeon: Mcarthur Rossetti, MD;  Location: [facility];  Service: Orthopedics;  Laterality: Right;    Prior to Admission medications   Medication Sig Start Date End Date Taking? Authorizing Provider  ALPRAZolam (XANAX) 0.5 MG tablet TAKE 1 TABLET BY MOUTH THREE TIMES A DAY Patient taking differently: Take 0.5 mg by mouth at bedtime. TAKE 1 TABLET BY MOUTH THREE TIMES A DAY  08/23/21  Yes Elby Showers, MD  aspirin EC 81 MG tablet Take 81 mg by mouth daily. Swallow whole.   Yes [provider]  buPROPion (WELLBUTRIN XL) 150 MG 24 hr tablet Take 1 tablet (150 mg total) by mouth daily. 11/09/21  Yes Baxley, Cresenciano Lick, MD  hydrochlorothiazide (HYDRODIURIL) 25 MG tablet TAKE ONE TABLET BY MOUTH ONE TIME DAILY WITH LOSARTAN 12/29/21  Yes Baxley, Cresenciano Lick, MD  JANUVIA 50 MG tablet TAKE ONE TABLET BY MOUTH ONE TIME DAILY 01/28/22  Yes Baxley, Cresenciano Lick, MD  olmesartan (BENICAR) 20 MG tablet Take 1 tablet (20 mg total) by mouth daily. 08/23/21  Yes Baxley, Cresenciano Lick, MD  rosuvastatin (CRESTOR) 5 MG tablet TAKE ONE TABLET BY MOUTH ONE TIME DAILY 11/01/21  Yes Baxley, Cresenciano Lick, MD  Accu-Chek FastClix Lancets MISC USE TO TEST BLOOD GLUCOSE BEFORE BREAKFAST AND SUPPER 07/20/19   Elby Showers, MD  ACCU-CHEK GUIDE test strip USE TO TEST BLOOD GLUCOSE BEFORE BREAKFAST AND SUPPER 08/02/20   Elby Showers, MD  sildenafil (VIAGRA) 100 MG tablet Take 0.5-1 tablets (50-100 mg total) by mouth daily as needed for erectile dysfunction. 11/09/21   Elby Showers, MD  tadalafil (CIALIS) 20 MG tablet SMARTSIG:0.5-1 Tablet(s) By Mouth Every Other Day PRN 01/01/22   [provider]  glipiZIDE (GLUCOTROL) 10 MG tablet Take 1 tablet (10 mg total)  by mouth daily before breakfast. 06/10/21 11/09/21  Elby Showers, MD    Current Outpatient Medications  Medication Sig Dispense Refill   ALPRAZolam (XANAX) 0.5 MG tablet TAKE 1 TABLET BY MOUTH THREE TIMES A DAY (Patient taking differently: Take 0.5 mg by mouth at bedtime. TAKE 1 TABLET BY MOUTH THREE TIMES A DAY) 90 tablet 5   aspirin EC 81 MG tablet Take 81 mg by mouth daily. Swallow whole.     buPROPion (WELLBUTRIN XL) 150 MG 24 hr tablet Take 1 tablet (150 mg total) by mouth daily. 90 tablet 1   hydrochlorothiazide (HYDRODIURIL) 25 MG tablet TAKE ONE TABLET BY MOUTH ONE TIME DAILY WITH LOSARTAN 90 tablet 3   JANUVIA 50 MG tablet TAKE ONE TABLET BY MOUTH  ONE TIME DAILY 90 tablet 1   olmesartan (BENICAR) 20 MG tablet Take 1 tablet (20 mg total) by mouth daily. 90 tablet 3   rosuvastatin (CRESTOR) 5 MG tablet TAKE ONE TABLET BY MOUTH ONE TIME DAILY 90 tablet 0   Accu-Chek FastClix Lancets MISC USE TO TEST BLOOD GLUCOSE BEFORE BREAKFAST AND SUPPER 102 each prn   ACCU-CHEK GUIDE test strip USE TO TEST BLOOD GLUCOSE BEFORE BREAKFAST AND SUPPER 100 strip PRN   sildenafil (VIAGRA) 100 MG tablet Take 0.5-1 tablets (50-100 mg total) by mouth daily as needed for erectile dysfunction. 5 tablet 11   tadalafil (CIALIS) 20 MG tablet SMARTSIG:0.5-1 Tablet(s) By Mouth Every Other Day PRN     Current Facility-Administered Medications  Medication Dose Route Frequency Provider Last Rate Last Admin   0.9 %  sodium chloride infusion  500 mL Intravenous Once Sharyn Creamer, MD        Allergies as of 02/07/2022 - Review Complete 02/07/2022  Allergen Reaction Noted   Iohexol  01/05/2009   Miralax [polyethylene glycol] Other (See Comments) 01/25/2022   Shellfish allergy Nausea And Vomiting 03/25/2021    Family History  Problem Relation Age of Onset   Breast cancer Mother    Prostate cancer Father    Cancer Father    Colon cancer Neg Hx    Colon polyps Neg Hx    Esophageal cancer Neg Hx    Stomach cancer Neg Hx    Rectal cancer Neg Hx     Social History   Socioeconomic History   Marital status: Married    Spouse name: Not on file   Number of children: Not on file   Years of education: Not on file   Highest education level: Not on file  Occupational History   Not on file  Tobacco Use   Smoking status: Never   Smokeless tobacco: Never  Vaping Use   Vaping Use: Never used  Substance and Sexual Activity   Alcohol use: Yes    Alcohol/week: 2.0 - 3.0 standard drinks    Types: 2 - 3 Shots of liquor per week    Comment: occ   Drug use: No   Sexual activity: Yes  Other Topics Concern   Not on file  Social History Narrative   Not on file    Social Determinants of Health   Financial Resource Strain: Not on file  Food Insecurity: Not on file  Transportation Needs: Not on file  Physical Activity: Not on file  Stress: Not on file  Social Connections: Not on file  Intimate Partner Violence: Not on file    Physical Exam: Vital signs in last 24 hours: BP (!) 157/85    Pulse 69  Temp 98.9 F (37.2 C) (Temporal)    Ht 6' (1.829 m)    Wt 213 lb (96.6 kg)    SpO2 97%    BMI 28.89 kg/m  GEN: NAD EYE: Sclerae anicteric ENT: MMM CV: Non-tachycardic Pulm: No increased work of breathing GI: Soft, NT/ND NEURO:  Alert & Oriented   Christia Reading, MD [facility]  02/07/2022 8:45 AM

## 2022-02-07 NOTE — Patient Instructions (Signed)
Please read handouts provided. Continue present medications. Await pathology results.   YOU HAD AN ENDOSCOPIC PROCEDURE TODAY AT THE [facility]:   Refer to the procedure report that was given to you for any specific questions about what was found during the examination.  If the procedure report does not answer your questions, please call your gastroenterologist to clarify.  If you requested that your care partner not be given the details of your procedure findings, then the procedure report has been included in a sealed envelope for you to review at your convenience later.  YOU SHOULD EXPECT: Some feelings of bloating in the abdomen. Passage of more gas than usual.  Walking can help get rid of the air that was put into your GI tract during the procedure and reduce the bloating. If you had a lower endoscopy (such as a colonoscopy or flexible sigmoidoscopy) you may notice spotting of blood in your stool or on the toilet paper. If you underwent a bowel prep for your procedure, you may not have a normal bowel movement for a few days.  Please Note:  You might notice some irritation and congestion in your nose or some drainage.  This is from the oxygen used during your procedure.  There is no need for concern and it should clear up in a day or so.  SYMPTOMS TO REPORT IMMEDIATELY:  Following lower endoscopy (colonoscopy or flexible sigmoidoscopy):  Excessive amounts of blood in the stool  Significant tenderness or worsening of abdominal pains  Swelling of the abdomen that is new, acute  Fever of 100F or higher   For urgent or emergent issues, a gastroenterologist can be reached at any hour by calling [phone]. Do not use MyChart messaging for urgent concerns.    DIET:  We do recommend a small meal at first, but then you may proceed to your regular diet.  Drink plenty of fluids but you should avoid alcoholic beverages for 24 hours.  ACTIVITY:  You should plan to take it easy  for the rest of today and you should NOT DRIVE or use heavy machinery until tomorrow (because of the sedation medicines used during the test).    FOLLOW UP: Our staff will call the number listed on your records 48-72 hours following your procedure to check on you and address any questions or concerns that you may have regarding the information given to you following your procedure. If we do not reach you, we will leave a message.  We will attempt to reach you two times.  During this call, we will ask if you have developed any symptoms of COVID 19. If you develop any symptoms (ie: fever, flu-like symptoms, shortness of breath, cough etc.) before then, please call [phone].  If you test positive for Covid 19 in the 2 weeks post procedure, please call and report this information to us.    If any biopsies were taken you will be contacted by phone or by letter within the next 1-3 weeks.  Please call us at [phone] if you have not heard about the biopsies in 3 weeks.    SIGNATURES/CONFIDENTIALITY: You and/or your care partner have signed paperwork which will be entered into your electronic medical record.  These signatures attest to the fact that that the information above on your After Visit Summary has been reviewed and is understood.  Full responsibility of the confidentiality of this discharge information lies with you and/or your care-partner.  

## 2022-02-07 NOTE — Progress Notes (Signed)
Called to room to assist during endoscopic procedure.  Patient ID and intended procedure confirmed with present staff. Received instructions for my participation in the procedure from the performing physician.  

## 2022-02-08 ENCOUNTER — Other Ambulatory Visit: Payer: Self-pay

## 2022-02-08 DIAGNOSIS — M7542 Impingement syndrome of left shoulder: Secondary | ICD-10-CM

## 2022-02-09 ENCOUNTER — Encounter: Payer: Self-pay | Admitting: Internal Medicine

## 2022-02-09 ENCOUNTER — Telehealth: Payer: Self-pay

## 2022-02-09 NOTE — Telephone Encounter (Signed)
°  Follow up Call-  Call back number 02/07/2022  Post procedure Call Back phone  # [phone]  Permission to leave phone message Yes  Some recent data might be hidden     Patient questions:  Do you have a fever, pain , or abdominal swelling? No. Pain Score  0 *  Have you tolerated food without any problems? Yes.    Have you been able to return to your normal activities? Yes.    Do you have any questions about your discharge instructions: Diet   No. Medications  No. Follow up visit  No.  Do you have questions or concerns about your Care? No.  Actions: * If pain score is 4 or above: No action needed, pain <4.  Have you developed a fever since your procedure? no  2.   Have you had an respiratory symptoms (SOB or cough) since your procedure? no  3.   Have you tested positive for COVID 19 since your procedure no  4.   Have you had any family members/close contacts diagnosed with the COVID 19 since your procedure?  no   If yes to any of these questions please route to Joylene John, RN and Joella Prince, RN

## 2022-02-21 ENCOUNTER — Ambulatory Visit
Admission: RE | Admit: 2022-02-21 | Discharge: 2022-02-21 | Disposition: A | Discharge status: Home / Self Care | Payer: Medicare Other | Source: Ambulatory Visit | Attending: Physician Assistant | Admitting: Physician Assistant

## 2022-02-21 DIAGNOSIS — M7542 Impingement syndrome of left shoulder: Secondary | ICD-10-CM

## 2022-02-27 ENCOUNTER — Other Ambulatory Visit: Payer: Self-pay | Admitting: Internal Medicine

## 2022-03-08 NOTE — Telephone Encounter (Signed)
MRI completed. Please advise ?

## 2022-03-09 ENCOUNTER — Telehealth: Payer: Self-pay | Admitting: Physician Assistant

## 2022-03-09 DIAGNOSIS — M19019 Primary osteoarthritis, unspecified shoulder: Secondary | ICD-10-CM

## 2022-03-09 NOTE — Telephone Encounter (Signed)
Phone call with patient per his request to go over the MRI.  Discussed with him that MRI showed significant arthritis of the [facility] joint with significant edema.  Also there is a punctate tear involving the rotator cuff.  Mild glenohumeral arthritis.  Discussed with him that I feel based on the MRI that his pain is coming from the [facility] joint.  Recommended 1 of 2 treatments 1 would be for therapeutic and diagnostic purposes an injection in the [facility] joint.  Second option would be left shoulder arthroscopy with extensive debridement and distal clavicle resection.  He would like to try an injection in the [facility] joint.  He will call our office to schedule an appointment.  Questions were encouraged and answered. ?

## 2022-03-16 ENCOUNTER — Encounter: Payer: Self-pay | Admitting: Physician Assistant

## 2022-03-16 ENCOUNTER — Ambulatory Visit (INDEPENDENT_AMBULATORY_CARE_PROVIDER_SITE_OTHER): Payer: Medicare Other | Admitting: Physician Assistant

## 2022-03-16 DIAGNOSIS — M19012 Primary osteoarthritis, left shoulder: Secondary | ICD-10-CM

## 2022-03-16 MED ORDER — METHYLPREDNISOLONE ACETATE 40 MG/ML IJ SUSP
40.0000 mg | INTRAMUSCULAR | Status: AC | PRN
  Administered 2022-03-16: 40 mg via INTRA_ARTICULAR

## 2022-03-16 MED ORDER — LIDOCAINE HCL 1 % IJ SOLN
3.0000 mL | INTRAMUSCULAR | Status: AC | PRN
  Administered 2022-03-16: 3 mL

## 2022-03-16 NOTE — Progress Notes (Signed)
? ?  Procedure Note ? ?Patient: [name] [name]             ?Date of Birth: 24-Jan-1956           ?MRN: [id number]             ?Visit Date: 03/16/2022 ?HPI: [name] [name] comes in today for left acromioclavicular joint injection.  Again MRI showed mild to moderate degenerative changes AC joint.  Subchondral marrow edema and peripheral osteophytes.  MRI showed type II acromion.  Rotator cuff with subscapularis tendinosis with a possible punctate tear.  Glenohumeral joint with mild thinning.  I did call patient with the MRI results and discussed with him at that point like to try an [facility] joint injection for both diagnostic and hopefully therapeutic purposes.  He is agreeable to this. ? ?Physical exam: Left shoulder has overall good range of motion of the shoulder.  Has tenderness over the [facility] joint but no pain with abduction of the left arm across the chest. ? ?Procedures: ?Visit Diagnoses:  ?1. Arthritis of left acromioclavicular joint   ? ? ?Medium Joint Inj: L acromioclavicular on 03/16/2022 4:31 PM ?Medications: 3 mL lidocaine 1 %; 40 mg methylPREDNISolone acetate 40 MG/ML ? ? ?Plan: He will call us and let us know what type of response he had to the injection.  He may benefit from a distal clavicle resection if he has good relief from the shoulder pain.  Questions were encouraged and answered. ? ? ?

## 2022-04-12 DIAGNOSIS — M25812 Other specified joint disorders, left shoulder: Secondary | ICD-10-CM

## 2022-04-12 DIAGNOSIS — M19012 Primary osteoarthritis, left shoulder: Secondary | ICD-10-CM

## 2022-04-13 ENCOUNTER — Telehealth: Payer: Self-pay

## 2022-04-13 NOTE — Telephone Encounter (Signed)
[name] [name] called patient about his left shoulder.  He states that the acromioclavicular joint injection helped refer him back to baseline as far as his pain.  Said no new injury.  Discussed with him surgical options this includes shoulder arthroscopy with extensive debridement, subacromial decompression, and distal clavicle resection.  Did discuss with him that his MRI did show a small punctate tear of his rotator cuff and that we would evaluate this with most likely this would not be repaired however if we did find a cuff to be torn we would repair it longer there.  Risk benefits of surgery discussed with length with the patient.  He will call us and let us know when he would like to proceed with surgery.  Questions were encouraged and answered ?

## 2022-04-13 NOTE — Telephone Encounter (Signed)
Called patient about his left shoulder.  He states that the acromioclavicular joint injection helped refer him back to baseline as far as his pain.  Said no new injury.  Discussed with him surgical options this includes shoulder arthroscopy with extensive debridement, subacromial decompression, and distal clavicle resection.  Did discuss with him that his MRI did show a small punctate tear of his rotator cuff and that we would evaluate this with most likely this would not be repaired however if we did find a cuff to be torn we would repair it longer there.  Risk benefits of surgery discussed with length with the patient.  He will call us and let us know when he would like to proceed with surgery.  Questions were encouraged and answered. ?

## 2022-04-13 NOTE — Progress Notes (Signed)
?CARDIOLOGY CONSULT NOTE  ? ? ? ?Patient ID: ?[name] [name] ?MRN: [id number] ?DOB/AGE: 68-Aug-1957 68 y.o. ? ?Primary Physician: Elby Showers, MD ?Primary Cardiologist: Johnsie Cancel ?Reason for Consultation: CAD Risk ? ? ? ?HPI:  66 y.o. referred by Dr Renold Genta 01/05/21 to assess CAD risk. History of HLD started on statin for LDL 142 in setting of type 2 DM with A1c 6.3 He has horses and works a barn Weight up as he is struggling with an achilles problem and has had a right hip replacement  He has significant anxiety and uses xanax I saw him back in 2012 and he had a normal ETT 01/03/12 Long standing history of murmur no w/u since child  ? ?TTE 01/26/22 EF normal mild AS mean gradient 12.6 mmHg  ? ? 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 ?Delay in getting cardiac CTA done 03/25/21 ?Calcium score 587 83 rd percentile  ?Read by Dr Debara Pickett as CAD RADS 3 proximal/ mid LAD ?FFR CT positive in the mid/distal LAD at 0.69 ? ?F/U cath with no significant CAD ?? LVEDP elevated may explain some dyspnea ? ?He is active with more exertional dyspnea. Right foot and achilles has limited him and weight is up Also post right  THR. Some tightness in chest with dyspnea No radiation, palpitations or syncope Compliant with meds Went to horse show with son in [place]  ? 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 ?He is going to have arthroscopic left shoulder surgery this year ? ? ? ?ROS ?All other systems reviewed and negative except as noted above ? ?Past Medical History:  ?Diagnosis Date  ? Anxiety   ? mild- past  hx  ? Arthritis   ? Rt hip  ? Cancer ([place]) 04/2019  ? Skin Rt hand. Dr. Ronnald Ramp  ? Diabetes mellitus without complication ([place])   ? GERD (gastroesophageal reflux disease)   ? Tums or omeprozole  ? Heart murmur   ? History of IBS   ? History of kidney stones   ? 30 years ago  ? Hypertension 2009  ? Meds  controlled  ?  ?Family History  ?Problem Relation Age of Onset  ? Breast cancer Mother   ? Prostate cancer Father   ? Cancer Father   ? Colon cancer Neg Hx   ? Colon polyps Neg Hx   ? Esophageal cancer Neg Hx   ? Stomach cancer Neg Hx   ? Rectal cancer Neg Hx   ?  ?Social History  ? ?Socioeconomic History  ? Marital status: Married  ?  Spouse name: Not on file  ? Number of children: Not on file  ? Years of education: Not on file  ? Highest education level: Not on file  ?Occupational History  ? Not on file  ?Tobacco Use  ? Smoking status: Never  ? Smokeless tobacco: Never  ?Vaping Use  ? Vaping Use: Never used  ?Substance and Sexual Activity  ? Alcohol use: Yes  ?  Alcohol/week: 2.0 - 3.0 standard drinks  ?  Types: 2 - 3 Shots of liquor per week  ?  Comment: occ  ? Drug use: No  ? Sexual activity: Yes  ?Other Topics Concern  ? Not on file  ?Social History Narrative  ? Not on file  ? ?Social Determinants of Health  ? ?  Financial Resource Strain: Not on file  ?Food Insecurity: Not on file  ?Transportation Needs: Not on file  ?Physical Activity: Not on file  ?Stress: Not on file  ?Social Connections: Not on file  ?Intimate Partner Violence: Not on file  ?  ?Past Surgical History:  ?Procedure Laterality Date  ? COLONOSCOPY  2005  ? has IBS  ? LEFT HEART CATH AND CORONARY ANGIOGRAPHY N/A 04/12/2021  ? Procedure: LEFT HEART CATH AND CORONARY ANGIOGRAPHY;  Surgeon: Belva Crome, MD;  Location: [facility];  Service: Cardiovascular;  Laterality: N/A;  ? renal stone    ? TOTAL HIP ARTHROPLASTY Right 05/10/2019  ? Procedure: RIGHT TOTAL HIP ARTHROPLASTY ANTERIOR APPROACH;  Surgeon: Mcarthur Rossetti, MD;  Location: [facility];  Service: Orthopedics;  Laterality: Right;  ?  ? ? ?Current Outpatient Medications:  ?  Accu-Chek FastClix Lancets MISC, USE TO TEST BLOOD GLUCOSE BEFORE BREAKFAST AND SUPPER, Disp: 102 each, Rfl: prn ?  ACCU-CHEK GUIDE test strip, USE TO TEST BLOOD GLUCOSE BEFORE BREAKFAST AND SUPPER, Disp:  100 strip, Rfl: PRN ?  ALPRAZolam (XANAX) 0.5 MG tablet, TAKE ONE TABLET BY MOUTH THREE TIMES A DAY, Disp: 90 tablet, Rfl: 5 ?  aspirin EC 81 MG tablet, Take 81 mg by mouth daily. Swallow whole., Disp: , Rfl:  ?  buPROPion (WELLBUTRIN XL) 150 MG 24 hr tablet, Take 1 tablet (150 mg total) by mouth daily., Disp: 90 tablet, Rfl: 1 ?  hydrochlorothiazide (HYDRODIURIL) 25 MG tablet, TAKE ONE TABLET BY MOUTH ONE TIME DAILY WITH LOSARTAN, Disp: 90 tablet, Rfl: 3 ?  JANUVIA 50 MG tablet, TAKE ONE TABLET BY MOUTH ONE TIME DAILY, Disp: 90 tablet, Rfl: 1 ?  olmesartan (BENICAR) 20 MG tablet, Take 1 tablet (20 mg total) by mouth daily., Disp: 90 tablet, Rfl: 3 ?  rosuvastatin (CRESTOR) 5 MG tablet, TAKE ONE TABLET BY MOUTH ONE TIME DAILY, Disp: 90 tablet, Rfl: 0 ?  sildenafil (VIAGRA) 100 MG tablet, Take 0.5-1 tablets (50-100 mg total) by mouth daily as needed for erectile dysfunction., Disp: 5 tablet, Rfl: 11 ?  tadalafil (CIALIS) 20 MG tablet, SMARTSIG:0.5-1 Tablet(s) By Mouth Every Other Day PRN, Disp: , Rfl:  ? ? ? ?Physical Exam: ?Blood pressure (!) 146/78, pulse 70, height 6' (1.829 m), weight 221 lb (100.2 kg), SpO2 98 %.  ? ?Affect appropriate ?Healthy:  appears stated age ?HEENT: normal ?Neck supple with no adenopathy ?JVP normal no bruits no thyromegaly ?Lungs clear with no wheezing and good diaphragmatic motion ?Heart:  S1/S2 AS  murmur, no rub, gallop or click ?PMI normal ?Abdomen: benighn, BS positve, no tenderness, no AAA ?no bruit.  No HSM or HJR ?Distal pulses intact with no bruits ?No edema ?Neuro non-focal ?Skin warm and dry ?No muscular weakness ? ? ?Labs: ?  ?Lab Results  ?Component Value Date  ? WBC 4.0 05/17/2021  ? HGB 14.5 05/17/2021  ? HCT 42.3 05/17/2021  ? MCV 94.4 05/17/2021  ? PLT 159 05/17/2021  ?  ?No results for input(s): NA, K, CL, CO2, BUN, CREATININE, CALCIUM, PROT, BILITOT, ALKPHOS, ALT, AST, GLUCOSE in the last 168 hours. ? ?Invalid input(s): LABALBU ? ?No results found for: CKTOTAL, CKMB,  CKMBINDEX, TROPONINI  ?Lab Results  ?Component Value Date  ? CHOL 137 11/08/2021  ? CHOL 136 05/17/2021  ? CHOL 212 (H) 11/09/2020  ? ?Lab Results  ?Component Value Date  ? HDL 49 11/08/2021  ? HDL 48 05/17/2021  ? HDL 37 (L) 11/09/2020  ? ?  Lab Results  ?Component Value Date  ? [place] 64 11/08/2021  ? [place] 67 05/17/2021  ? LDLCALC 142 (H) 11/09/2020  ? ?Lab Results  ?Component Value Date  ? TRIG 159 (H) 11/08/2021  ? TRIG 124 05/17/2021  ? TRIG 189 (H) 11/09/2020  ? ?Lab Results  ?Component Value Date  ? CHOLHDL 2.8 11/08/2021  ? CHOLHDL 2.8 05/17/2021  ? CHOLHDL 5.7 (H) 11/09/2020  ? ?No results found for: LDLDIRECT  ?  ?Radiology: ?No results found. ? ? ?EKG: 05/15/20 NSR non specific ST changes  ? ? ?ASSESSMENT AND PLAN:  ? ?1. CAD :  seems that FFR false positive Calcium score elevated but no obstructive CAD by cath 04/12/21  continue medical Rx and risk factor modification  ?2. DM:: continue Januvia , Glipizide d/c A1c 5.9  ?3. HLD:  F/u labs Dr Renold Genta on statin now  LDL 64  ?4. Ortho:  Ok to have left shoulder surgery with Dr Pryor Montes group latter this year  ?5. Anxiety PRN xanax ?6. AS:  Mild by TTE2/1/23 mean gradient 12.6 peak 22.3 mmHg DVI 0.55 AVA 1.6 cm2 ?F/U echo in February 2024  ? ? ?F/U  in a year  ? ?Signed: ?Jenkins Rouge ?04/25/2022, 10:14 AM ? ? ?

## 2022-04-25 ENCOUNTER — Ambulatory Visit (INDEPENDENT_AMBULATORY_CARE_PROVIDER_SITE_OTHER): Payer: Medicare Other | Admitting: Cardiovascular Disease

## 2022-04-25 ENCOUNTER — Encounter: Payer: Self-pay | Admitting: Cardiovascular Disease

## 2022-04-25 VITALS — BP 146/78 | HR 70 | Ht 72.0 in | Wt 221.0 lb

## 2022-04-25 DIAGNOSIS — I350 Nonrheumatic aortic (valve) stenosis: Secondary | ICD-10-CM

## 2022-04-25 NOTE — Patient Instructions (Signed)
Medication Instructions:  ?Your physician recommends that you continue on your current medications as directed. Please refer to the Current Medication list given to you today. ? ?*If you need a refill on your cardiac medications before your next appointment, please call your pharmacy* ? ?Lab Work: ?If you have labs (blood work) drawn today and your tests are completely normal, you will receive your results only by: ?MyChart Message (if you have MyChart) OR ?A paper copy in the mail ?If you have any lab test that is abnormal or we need to change your treatment, we will call you to review the results. ? ?Testing/Procedures: ?Your physician has requested that you have an echocardiogram in February 2024. Echocardiography is a painless test that uses sound waves to create images of your heart. It provides your doctor with information about the size and shape of your heart and how well your heart?s chambers and valves are working. This procedure takes approximately one hour. There are no restrictions for this procedure. ? ?Follow-Up: ?At [facility], you and your health needs are our priority.  As part of our continuing mission to provide you with exceptional heart care, we have created designated Provider Care Teams.  These Care Teams include your primary Cardiologist (physician) and Advanced Practice Providers (APPs -  Physician Assistants and Nurse Practitioners) who all work together to provide you with the care you need, when you need it. ? ?We recommend signing up for the patient portal called "MyChart".  Sign up information is provided on this After Visit Summary.  MyChart is used to connect with patients for Virtual Visits (Telemedicine).  Patients are able to view lab/test results, encounter notes, upcoming appointments, etc.  Non-urgent messages can be sent to your provider as well.   ?To learn more about what you can do with MyChart, go to [email or web address].   ? ?Your next appointment:   ?1  year(s) ? ?The format for your next appointment:   ?In Person ? ?Provider:   ?Jenkins Rouge, MD { ? ?Important Information About Sugar ? ? ? ? ?  ?

## 2022-04-28 ENCOUNTER — Other Ambulatory Visit: Payer: Self-pay | Admitting: Internal Medicine

## 2022-05-28 ENCOUNTER — Other Ambulatory Visit: Payer: Self-pay | Admitting: Internal Medicine

## 2022-06-02 ENCOUNTER — Other Ambulatory Visit: Payer: Self-pay | Admitting: Internal Medicine

## 2022-07-07 ENCOUNTER — Encounter: Payer: Self-pay | Admitting: Internal Medicine

## 2022-07-07 ENCOUNTER — Ambulatory Visit (INDEPENDENT_AMBULATORY_CARE_PROVIDER_SITE_OTHER): Payer: Medicare Other | Admitting: Internal Medicine

## 2022-07-07 VITALS — BP 104/64 | HR 83 | Temp 97.3°F

## 2022-07-07 DIAGNOSIS — B349 Viral infection, unspecified: Secondary | ICD-10-CM

## 2022-07-07 MED ORDER — AZITHROMYCIN 250 MG PO TABS: ORAL_TABLET | ORAL | 0 refills | Status: DC

## 2022-07-07 MED ORDER — HYDROCODONE BIT-HOMATROP MBR 5-1.5 MG/5ML PO SOLN: 5.0000 mL | Freq: Three times a day (TID) | ORAL | 0 refills | Status: DC | PRN

## 2022-07-07 NOTE — Progress Notes (Signed)
   Subjective:    Patient ID: [name] [name], male    DOB: 1956-05-17, 66 y.o.   MRN: [id number]  HPI Patient's wife went to visit her mother out of state and after returning home, came down with a respiratory infection.  Patient has developed similar symptoms as his wife.  He has headache, sore throat.  He took COVID test yesterday that was negative.  He has cough and respiratory congestion as well as malaise and fatigue.  He is scheduled to travel to [place] to a cutting horse event in the near future.    Review of Systems patient has had cough, respiratory congestion, malaise, fatigue, headache and sore throat.  No nausea or vomiting.     Objective:   Physical Exam Currently afebrile.  Pulse 83 regular, blood pressure 104/64 pulse oximetry 96% on room air Skin warm and dry.  Pharynx clear without exudate.  TMs clear.  Neck supple.  Chest clear.      Assessment & Plan:  Acute viral syndrome-is tested negative for COVID-19.  He says he is feeling better than he did yesterday.  Plan: Since he is planning to travel to [place] ,I would like for him to have Zithromax Z-PAK 2 tabs day 1 followed by 1 tab days 2 through 5.  He was also prescribed Hycodan 1 teaspoon every 8 hours as needed for cough.  Stay well-hydrated.  Rest and stay out of the heat if possible.  Call if not improving in 48 hours or sooner if worse.

## 2022-07-08 DIAGNOSIS — E119 Type 2 diabetes mellitus without complications: Secondary | ICD-10-CM | POA: Insufficient documentation

## 2022-07-10 NOTE — Patient Instructions (Addendum)
Take Zithromax Z-PAK 2 tabs day 1 followed by 1 tab days 2 through 5.  Hycodan 1 teaspoon every 8 hours as needed for cough.  Stay well-hydrated.  Rest is much as possible and stay out of the heat if possible.  Call if not improving in 48 hours or sooner if worse.

## 2022-07-11 ENCOUNTER — Other Ambulatory Visit: Payer: Medicare Other

## 2022-07-11 DIAGNOSIS — I10 Essential (primary) hypertension: Secondary | ICD-10-CM

## 2022-07-11 DIAGNOSIS — N529 Male erectile dysfunction, unspecified: Secondary | ICD-10-CM

## 2022-07-11 DIAGNOSIS — E1169 Type 2 diabetes mellitus with other specified complication: Secondary | ICD-10-CM

## 2022-07-11 DIAGNOSIS — E119 Type 2 diabetes mellitus without complications: Secondary | ICD-10-CM

## 2022-07-11 DIAGNOSIS — Z125 Encounter for screening for malignant neoplasm of prostate: Secondary | ICD-10-CM

## 2022-07-11 DIAGNOSIS — R351 Nocturia: Secondary | ICD-10-CM

## 2022-07-12 ENCOUNTER — Ambulatory Visit (INDEPENDENT_AMBULATORY_CARE_PROVIDER_SITE_OTHER): Payer: Medicare Other | Admitting: Internal Medicine

## 2022-07-12 ENCOUNTER — Encounter: Payer: Self-pay | Admitting: Internal Medicine

## 2022-07-12 VITALS — BP 152/78 | HR 79 | Temp 97.9°F | Ht 70.0 in | Wt 218.0 lb

## 2022-07-12 DIAGNOSIS — R059 Cough, unspecified: Secondary | ICD-10-CM | POA: Diagnosis not present

## 2022-07-12 DIAGNOSIS — Z6831 Body mass index (BMI) 31.0-31.9, adult: Secondary | ICD-10-CM

## 2022-07-12 DIAGNOSIS — E1169 Type 2 diabetes mellitus with other specified complication: Secondary | ICD-10-CM

## 2022-07-12 DIAGNOSIS — I10 Essential (primary) hypertension: Secondary | ICD-10-CM

## 2022-07-12 DIAGNOSIS — Z8719 Personal history of other diseases of the digestive system: Secondary | ICD-10-CM

## 2022-07-12 DIAGNOSIS — Z8659 Personal history of other mental and behavioral disorders: Secondary | ICD-10-CM

## 2022-07-12 DIAGNOSIS — J069 Acute upper respiratory infection, unspecified: Secondary | ICD-10-CM

## 2022-07-12 DIAGNOSIS — Z0000 Encounter for general adult medical examination without abnormal findings: Secondary | ICD-10-CM | POA: Diagnosis not present

## 2022-07-12 DIAGNOSIS — Z86010 Personal history of colonic polyps: Secondary | ICD-10-CM | POA: Diagnosis not present

## 2022-07-12 DIAGNOSIS — K580 Irritable bowel syndrome with diarrhea: Secondary | ICD-10-CM

## 2022-07-12 DIAGNOSIS — E782 Mixed hyperlipidemia: Secondary | ICD-10-CM

## 2022-07-12 DIAGNOSIS — M19012 Primary osteoarthritis, left shoulder: Secondary | ICD-10-CM

## 2022-07-12 DIAGNOSIS — Z96641 Presence of right artificial hip joint: Secondary | ICD-10-CM

## 2022-07-12 DIAGNOSIS — E119 Type 2 diabetes mellitus without complications: Secondary | ICD-10-CM

## 2022-07-12 LAB — HEMOGLOBIN A1C
Hgb A1c MFr Bld: 5.8 % of total Hgb — ABNORMAL HIGH (ref <5.7)
Mean Plasma Glucose: 120 mg/dL
eAG (mmol/L): 6.6 mmol/L

## 2022-07-12 LAB — CBC WITH DIFFERENTIAL/PLATELET
Absolute Monocytes: 301 cells/uL (ref 200–950)
Basophils Absolute: 30 cells/uL (ref 0–200)
Basophils Relative: 0.7 %
Eosinophils Absolute: 120 cells/uL (ref 15–500)
Eosinophils Relative: 2.8 %
HCT: 42.1 % (ref 38.5–50.0)
Hemoglobin: 15 g/dL (ref 13.2–17.1)
Lymphs Abs: 2038 cells/uL (ref 850–3900)
MCH: 33.4 pg — ABNORMAL HIGH (ref 27.0–33.0)
MCHC: 35.6 g/dL (ref 32.0–36.0)
MCV: 93.8 fL (ref 80.0–100.0)
MPV: 9.2 fL (ref 7.5–12.5)
Monocytes Relative: 7 %
Neutro Abs: 1810 cells/uL (ref 1500–7800)
Neutrophils Relative %: 42.1 %
Platelets: 176 10*3/uL (ref 140–400)
RBC: 4.49 10*6/uL (ref 4.20–5.80)
RDW: 12.5 % (ref 11.0–15.0)
Total Lymphocyte: 47.4 %
WBC: 4.3 10*3/uL (ref 3.8–10.8)

## 2022-07-12 LAB — COMPLETE METABOLIC PANEL WITH GFR
AG Ratio: 1.7 (calc) (ref 1.0–2.5)
ALT: 29 U/L (ref 9–46)
AST: 26 U/L (ref 10–35)
Albumin: 4.5 g/dL (ref 3.6–5.1)
Alkaline phosphatase (APISO): 40 U/L (ref 35–144)
BUN: 17 mg/dL (ref 7–25)
CO2: 27 mmol/L (ref 20–32)
Calcium: 9.3 mg/dL (ref 8.6–10.3)
Chloride: 102 mmol/L (ref 98–110)
Creat: 1.08 mg/dL (ref 0.70–1.35)
Globulin: 2.7 g/dL (calc) (ref 1.9–3.7)
Glucose, Bld: 124 mg/dL — ABNORMAL HIGH (ref 65–99)
Potassium: 4.3 mmol/L (ref 3.5–5.3)
Sodium: 140 mmol/L (ref 135–146)
Total Bilirubin: 0.6 mg/dL (ref 0.2–1.2)
Total Protein: 7.2 g/dL (ref 6.1–8.1)
eGFR: 75 mL/min/{1.73_m2} (ref >=60)

## 2022-07-12 LAB — POCT URINALYSIS DIPSTICK
Bilirubin, UA: NEGATIVE
Blood, UA: NEGATIVE
Glucose, UA: NEGATIVE
Ketones, UA: NEGATIVE
Leukocytes, UA: NEGATIVE
Nitrite, UA: NEGATIVE
Protein, UA: NEGATIVE
Spec Grav, UA: 1.02 (ref 1.010–1.025)
Urobilinogen, UA: 0.2 E.U./dL
pH, UA: 6 (ref 5.0–8.0)

## 2022-07-12 LAB — LIPID PANEL
Cholesterol: 121 mg/dL (ref <200)
HDL: 46 mg/dL (ref >=40)
LDL Cholesterol (Calc): 55 mg/dL (calc)
Non-HDL Cholesterol (Calc): 75 mg/dL (calc) (ref <130)
Total CHOL/HDL Ratio: 2.6 (calc) (ref <5.0)
Triglycerides: 122 mg/dL (ref <150)

## 2022-07-12 LAB — PSA: PSA: 1.81 ng/mL (ref <=4.00)

## 2022-07-12 MED ORDER — METHYLPREDNISOLONE ACETATE 80 MG/ML IJ SUSP
80.0000 mg | Freq: Once | INTRAMUSCULAR | Status: AC
  Administered 2022-07-12: 80 mg via INTRAMUSCULAR

## 2022-07-12 MED ORDER — CELECOXIB 200 MG PO CAPS: 200.0000 mg | ORAL_CAPSULE | Freq: Two times a day (BID) | ORAL | 1 refills | Status: DC

## 2022-07-12 MED ORDER — AZITHROMYCIN 250 MG PO TABS: ORAL_TABLET | ORAL | 0 refills | Status: AC

## 2022-07-12 NOTE — Progress Notes (Unsigned)
     Annual Wellness Visit     Patient: [name] [name], Male    DOB: 1956-01-15, 66 y.o.   MRN: [id number] Visit Date: 07/12/2022  No chief complaint on file.  Subjective    [name] [name] is a 66 y.o. male who presents today for his Annual Wellness Visit.  HPI 66 year old Male seen for health maintenace   Social History   Social History Narrative   Not on file    Patient Care Team: Shar Paez, Cresenciano Lick, MD as PCP - General (Internal Medicine) Josue Hector, MD as PCP - Cardiology (Cardiology)  Review of Systems left shoulder pain Had    Objective    Vitals: BP (!) 152/78   Pulse 79   Temp 97.9 F (36.6 C) (Tympanic)   Ht '5\' 10"'$  (1.778 m)   Wt 218 lb (98.9 kg)   SpO2 97%   BMI 31.28 kg/m   Physical Exam   Most recent functional status assessment:    07/12/2022    2:10 PM  In your present state of health, do you have any difficulty performing the following activities:  Hearing? 0  Vision? 0  Difficulty concentrating or making decisions? 0  Walking or climbing stairs? 0  Dressing or bathing? 0  Doing errands, shopping? 0  Preparing Food and eating ? N  Using the Toilet? N  In the past six months, have you accidently leaked urine? N  Do you have problems with loss of bowel control? N  Managing your Medications? N  Managing your Finances? N  Housekeeping or managing your Housekeeping? N   Most recent fall risk assessment:    07/12/2022    2:10 PM  [place] in the past year? 0  Number falls in past yr: 0  Injury with Fall? 0  Risk for fall due to : No Fall Risks  Follow up Falls evaluation completed    Most recent depression screenings:    07/12/2022    2:10 PM 07/07/2022    2:16 PM  PHQ 2/9 Scores  PHQ - 2 Score 0 0   Most recent cognitive screening:    07/12/2022    2:11 PM  6CIT Screen  What Year? 0 points  What month? 0 points  What time? 0 points  Count back from 20 0 points  Months in reverse 0 points  Repeat phrase 0 points   Total Score 0 points       Assessment & Plan     Annual wellness visit done today including the all of the following: Reviewed patient's Family Medical History Reviewed and updated list of patient's medical providers Assessment of cognitive impairment was done Assessed patient's functional ability Established a written schedule for health screening [place] Completed and Reviewed  Discussed health benefits of physical activity, and encouraged him to engage in regular exercise appropriate for his age and condition.         {provider attestation***:1}   Angus Seller, CMA

## 2022-07-12 NOTE — Patient Instructions (Signed)
Depomedrol 80 mg IM for persistent cough.

## 2022-07-13 LAB — MICROALBUMIN, URINE: Microalb, Ur: 0.4 mg/dL

## 2022-07-27 ENCOUNTER — Other Ambulatory Visit: Payer: Self-pay | Admitting: Internal Medicine

## 2022-08-09 ENCOUNTER — Ambulatory Visit: Payer: Medicare Other | Admitting: Internal Medicine

## 2022-08-26 ENCOUNTER — Other Ambulatory Visit: Payer: Self-pay | Admitting: Internal Medicine

## 2022-09-16 ENCOUNTER — Other Ambulatory Visit: Payer: Self-pay | Admitting: Internal Medicine

## 2022-09-19 ENCOUNTER — Encounter: Payer: Self-pay | Admitting: Internal Medicine

## 2022-09-19 ENCOUNTER — Ambulatory Visit (INDEPENDENT_AMBULATORY_CARE_PROVIDER_SITE_OTHER): Payer: Medicare Other | Admitting: Internal Medicine

## 2022-09-19 VITALS — BP 134/76 | HR 66 | Temp 98.4°F | Ht 70.0 in | Wt 229.8 lb

## 2022-09-19 DIAGNOSIS — E119 Type 2 diabetes mellitus without complications: Secondary | ICD-10-CM | POA: Diagnosis not present

## 2022-09-19 NOTE — Patient Instructions (Signed)
Written prescription for semaglutide injectable and also gave him samples of Rybelsus.  He may try one drug or the other.  Not sure if insurance will pay for semaglutide injectable.  He has an appointment with endocrinologist in January.  His hemoglobin A1c is excellent on Januvia so he is going to need to be careful with these newer drugs i.e. make sure he is not getting hypoglycemic.

## 2022-09-19 NOTE — Progress Notes (Signed)
   Subjective:    Patient ID: [name] [name], male    DOB: Feb 11, 1956, 66 y.o.   MRN: [id number]  HPI 66 year old Male with Type 2 daibetes treated with Januvia who has appt in January with Dr. Kelton Pillar. He was out in [place] recently and learned about diabetic drugs that can assist in weight loss. He wants to try one of these drugs. 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 Hgb AIC was 5.8% in July. He is interested on Ozempic. Explained that these drugs are hard to get approval from insurance company.I did write an Rx for Semiglitide injectable and gave him samples of oral Rybelsus.  Perhaps 1 of these preparations will work for him.Explained there is side effect of nausea.  His weight is currently 229 pounds 12.8 ounces and BMI is 32.97.  In July his weight was 218 pounds and BMI was 31.28  In January 2023 his weight was 221 pounds and BMI was 29.16  In May 2022 his weight was 226 pounds and BMI was 30.65.  He underwent cardiac catheterization after coronary calcium score noted to be 587 and patient had murmur.  And heavy calcification noted in the LAD and aortic atherosclerosis.  Had TTE February 2022 showing mild aortic stenosis with mean gradient 10 mm with normal ejection fraction.  Underwent cardiac catheterization and had right dominant coronary anatomy.  Had luminal irregularities in the LAD and widely patent coronaries without obstructive disease.  His AIC was 5.8% in July.  Review of Systems see above. No chest pain or SOB     Objective:   Physical Exam Has gained 11 pounds since July. He is not happy with his weight. He really wants to try one of the newer drugs for DM and weight loss.       Assessment & Plan:  Type 2 diabetes mellitus  BMI 32.97  Plan: Samples of Rybelsus.  He has appointment with Endocrinology in January with Dr.  Rosezella Rumpf for.  Also wrote for semaglutide injectable.  He is to try 1 drug or the other.  Not both.  He is going to need to watch carefully for signs of hypoglycemia.  Also has been warned these medications can cause nausea.  Offered referral to [facility] or [facility] but he declines at this time.

## 2022-09-20 ENCOUNTER — Telehealth: Payer: Self-pay | Admitting: Internal Medicine

## 2022-09-20 NOTE — Telephone Encounter (Signed)
Called patient to let him know what Dr Renold Genta said, he verbalized understanding

## 2022-09-20 NOTE — Telephone Encounter (Signed)
[name] [name] [phone]  [name] called to ask if he is supposed to stop the Januvia before starting the new medicine or does he take them together?

## 2022-10-25 ENCOUNTER — Other Ambulatory Visit: Payer: Self-pay | Admitting: Internal Medicine

## 2022-11-24 ENCOUNTER — Other Ambulatory Visit: Payer: Self-pay | Admitting: Internal Medicine

## 2022-12-09 ENCOUNTER — Other Ambulatory Visit: Payer: Self-pay | Admitting: Internal Medicine

## 2022-12-15 LAB — HM DIABETES EYE EXAM

## 2022-12-24 ENCOUNTER — Other Ambulatory Visit: Payer: Self-pay | Admitting: Internal Medicine

## 2023-01-03 ENCOUNTER — Telehealth (INDEPENDENT_AMBULATORY_CARE_PROVIDER_SITE_OTHER): Payer: Medicare Other | Admitting: Internal Medicine

## 2023-01-03 ENCOUNTER — Encounter: Payer: Self-pay | Admitting: Internal Medicine

## 2023-01-03 DIAGNOSIS — I10 Essential (primary) hypertension: Secondary | ICD-10-CM

## 2023-01-03 DIAGNOSIS — Z8679 Personal history of other diseases of the circulatory system: Secondary | ICD-10-CM

## 2023-01-03 DIAGNOSIS — E1169 Type 2 diabetes mellitus with other specified complication: Secondary | ICD-10-CM

## 2023-01-03 DIAGNOSIS — E119 Type 2 diabetes mellitus without complications: Secondary | ICD-10-CM | POA: Diagnosis not present

## 2023-01-03 DIAGNOSIS — E782 Mixed hyperlipidemia: Secondary | ICD-10-CM

## 2023-01-03 DIAGNOSIS — H6691 Otitis media, unspecified, right ear: Secondary | ICD-10-CM | POA: Diagnosis not present

## 2023-01-03 DIAGNOSIS — J069 Acute upper respiratory infection, unspecified: Secondary | ICD-10-CM | POA: Diagnosis not present

## 2023-01-03 MED ORDER — AZITHROMYCIN 250 MG PO TABS: ORAL_TABLET | ORAL | 0 refills | Status: AC

## 2023-01-03 NOTE — Patient Instructions (Signed)
Take Zithromax Z-PAK 2 tabs day 1 followed by 1 tab days 2 through 5.  Rest and stay well-hydrated.  Monitor Accu-Cheks.  Call if not improving in 48 hours or sooner if worse.
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
## 2023-01-04 IMAGING — MR MR SHOULDER*L* W/O CM
5 of 6 series · 30 of 40 positions shown · non-contrast
Comparison: Left shoulder radiographs 01/03/2022

CLINICAL DATA: Shoulder pain.  Rotator cuff disorder suspected.

EXAM:
MRI OF THE LEFT SHOULDER WITHOUT CONTRAST
TECHNIQUE: Multiplanar, multisequence MR imaging of the shoulder was performed.
No intravenous contrast was administered.

[Series 6: T2 fat-sat · axial · left · 4.0mm · 0.27mm/px · z∈[-88,+8]mm · 8 of 24 slices shown (1 of 3)]
[im 1/24]
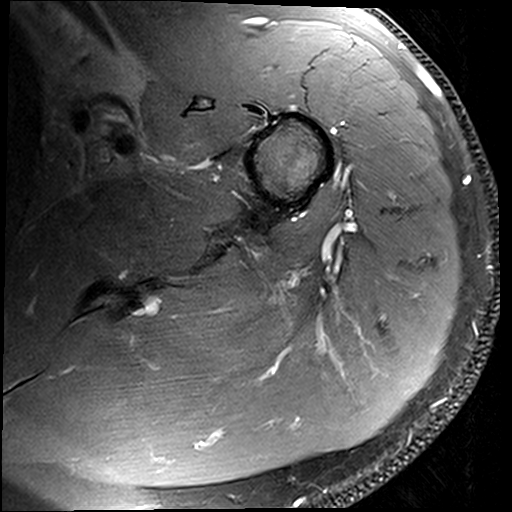
[im 4/24]
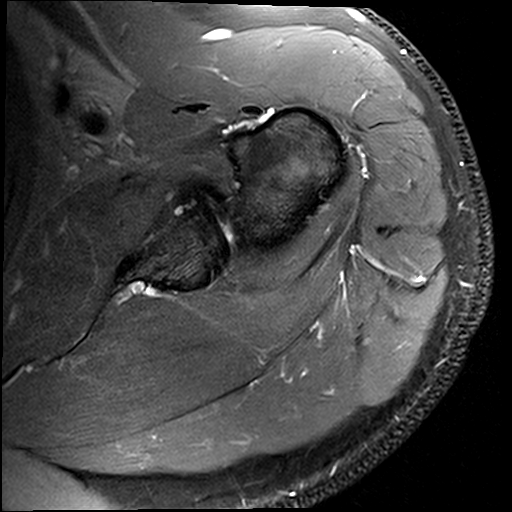
[im 7/24]
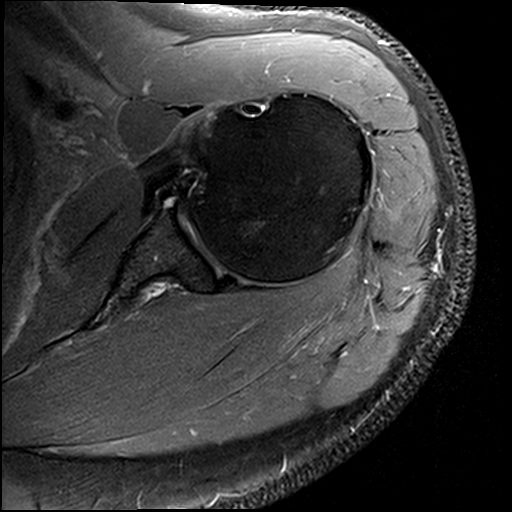
[im 10/24]
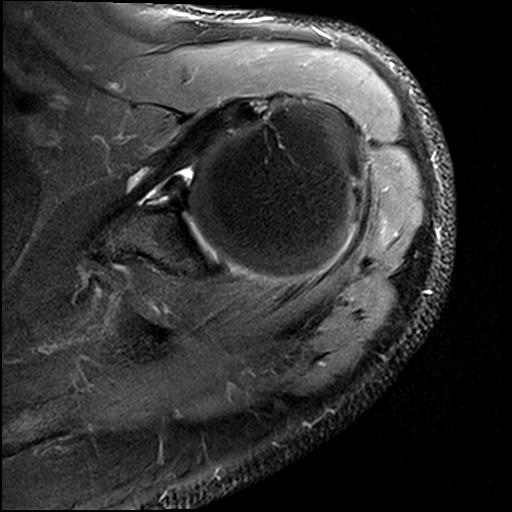
[im 14/24]
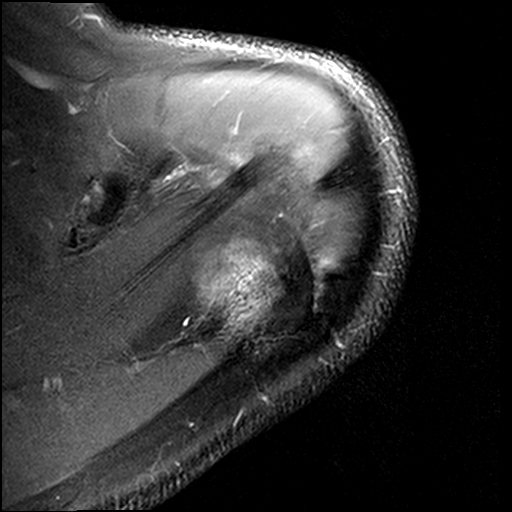
[im 17/24]
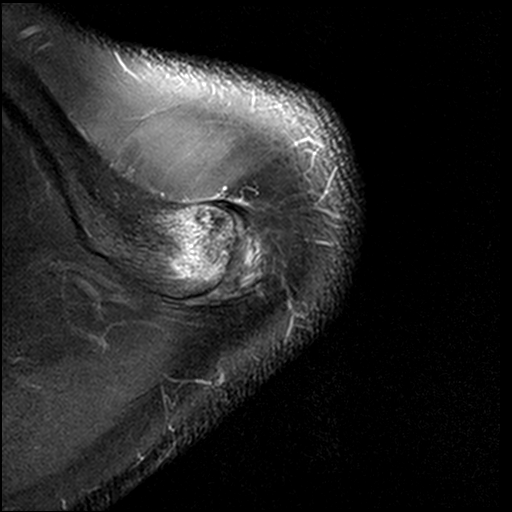
[im 20/24]
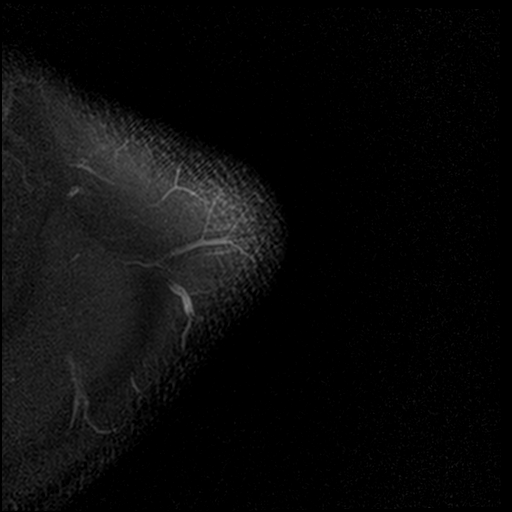
[im 24/24]
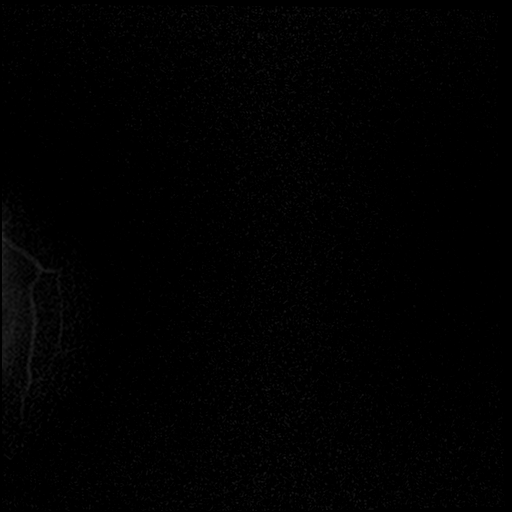

[Series 7: T2 fat-sat · oblique · left · 4.0mm · 0.27mm/px · 6 of 20 slices shown (2 of 3)]
[im 1/20]
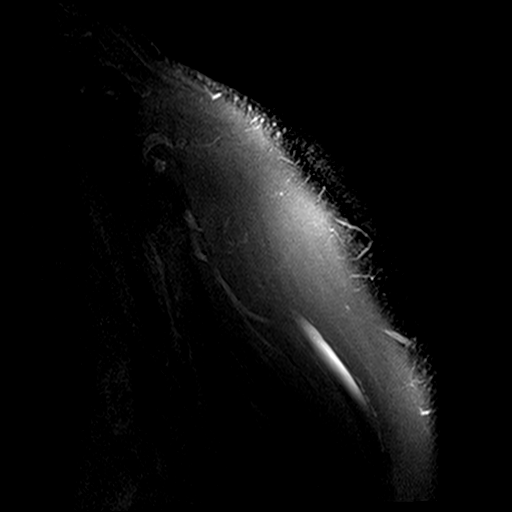
[im 4/20]
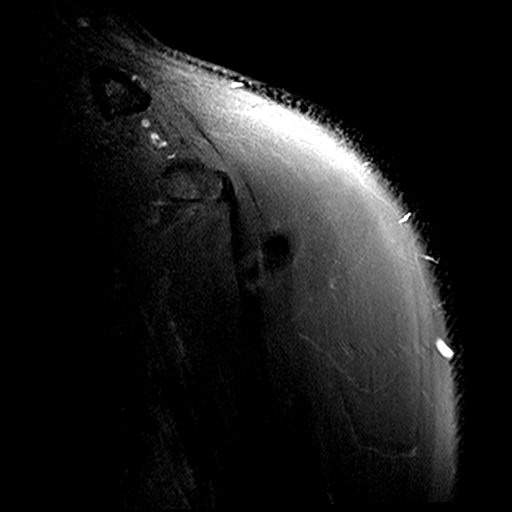
[im 8/20]
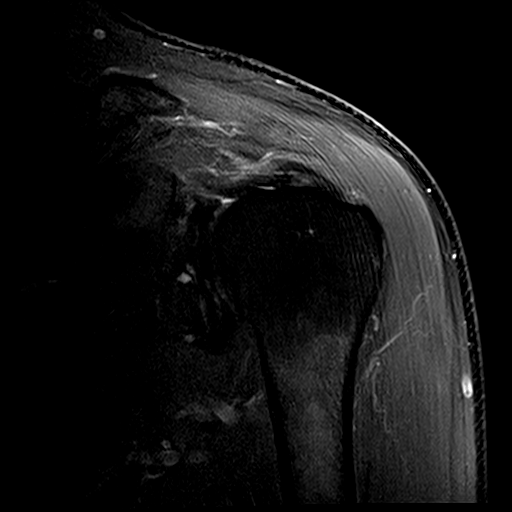
[im 12/20]
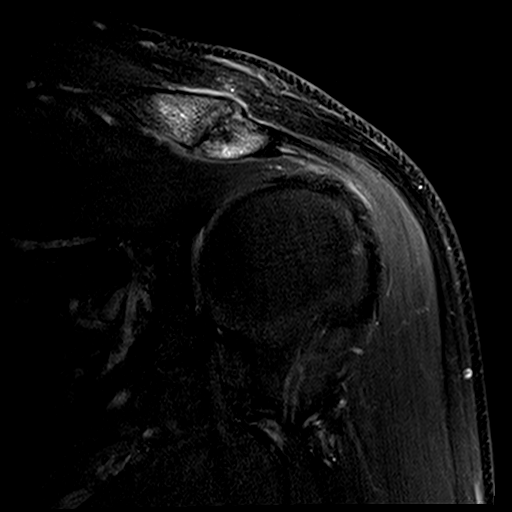
[im 16/20]
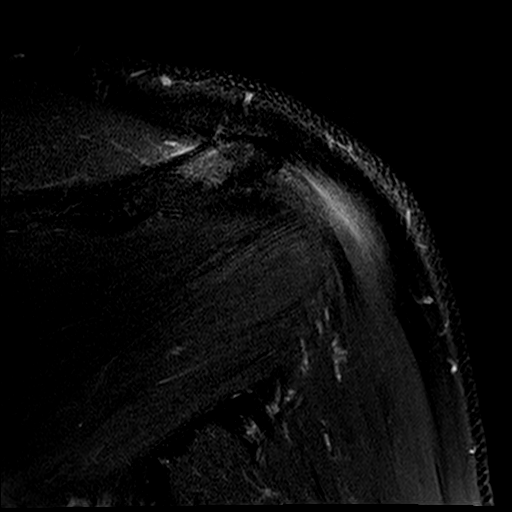
[im 20/20]
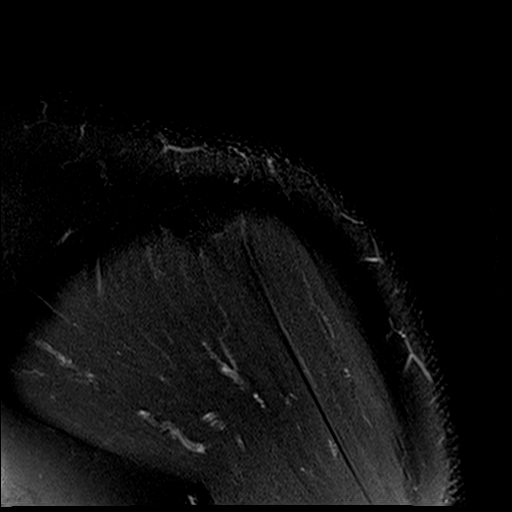

[Series 8: PD · oblique · left · 4.0mm · 0.55mm/px · 6 of 20 slices shown (1 of 2)]
[im 1/20]
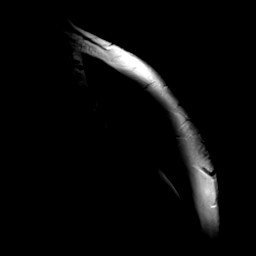
[im 4/20]
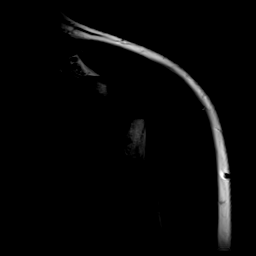
[im 8/20]
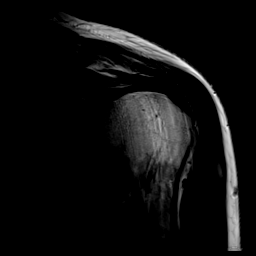
[im 12/20]
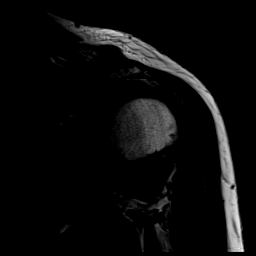
[im 16/20]
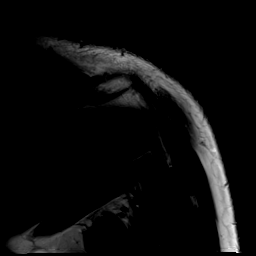
[im 20/20]
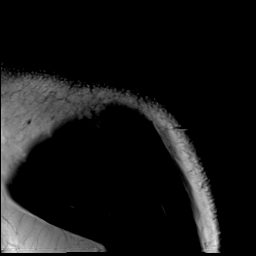

[Series 9: T2 fat-sat · oblique · left · 4.0mm · 0.55mm/px · 4 of 21 slices shown (3 of 3)]
[im 1/21]
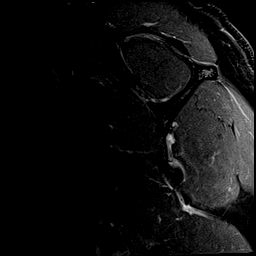
[im 4/21]
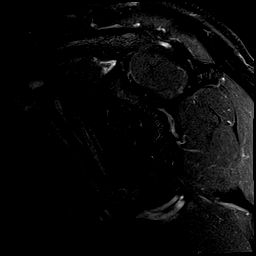
[im 7/21]
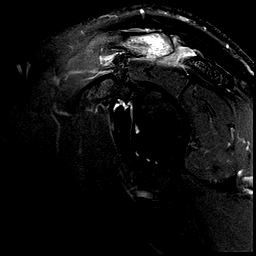
[im 11/21]
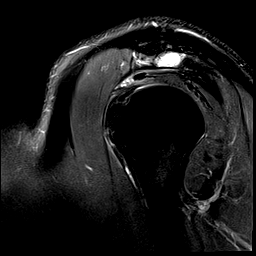

[Series 11: PD · oblique · left · 4.0mm · 0.55mm/px · 6 of 20 slices shown (2 of 2)]
[im 1/20]
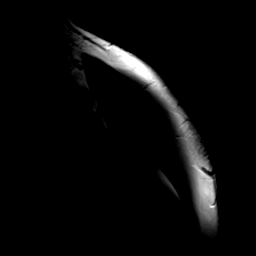
[im 4/20]
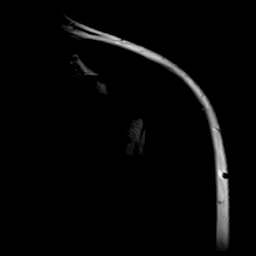
[im 8/20]
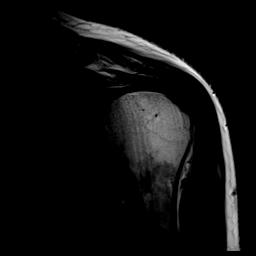
[im 12/20]
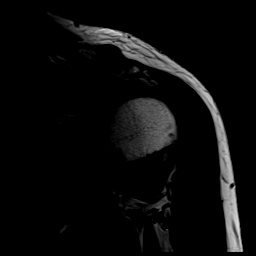
[im 16/20]
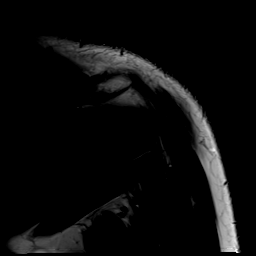
[im 20/20]
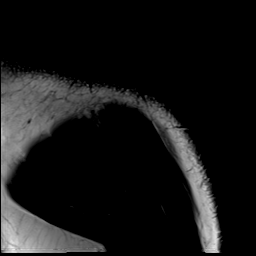

[30 of 40 positions shown; findings below may reference images not displayed]

FINDINGS: Rotator cuff: The supraspinatus and infraspinatus tendons are
intact. Mild mid height of the subscapularis intermediate T2 signal
tendinosis with possible punctate tear (axial series 6, image 17).
No tendon retraction. The teres minor is intact.

Muscles: No rotator cuff muscle atrophy, fatty infiltration, or
edema.

Biceps long head: The intra-articular long head of the biceps tendon
is intact.

Acromioclavicular Joint: There are mild-to-moderate degenerative
changes of the acromioclavicular joint including joint space
narrowing, subchondral marrow edema, and peripheral osteophytosis.
Subchondral marrow edema is moderate to high-grade suggesting
mobility and a likely source of pain at the acromioclavicular joint.
Type II acromion. Trace fluid within the subacromial/subdeltoid
bursa.

Glenohumeral Joint: Mild thinning of the glenoid and humeral head
cartilage.

Labrum: Moderate degenerative irregularity of the anterior inferior
glenoid labrum.

Bones:  No acute fracture.

Other: None.
IMPRESSION: :
IMPRESSION: 1. Mild mid height of the subscapularis insertional tendinosis with
possible punctate tear.
2. Mild-to-moderate acromioclavicular osteoarthritis. This includes
moderate to high-grade subchondral marrow edema suggesting mobility
at the joint and a source of pain.
3. Mild glenohumeral osteoarthritis.

## 2023-01-10 ENCOUNTER — Ambulatory Visit: Payer: Medicare Other | Admitting: Internal Medicine

## 2023-01-16 ENCOUNTER — Other Ambulatory Visit: Payer: Medicare Other

## 2023-01-16 DIAGNOSIS — E119 Type 2 diabetes mellitus without complications: Secondary | ICD-10-CM

## 2023-01-16 DIAGNOSIS — E1169 Type 2 diabetes mellitus with other specified complication: Secondary | ICD-10-CM

## 2023-01-17 ENCOUNTER — Encounter: Payer: Self-pay | Admitting: Internal Medicine

## 2023-01-17 ENCOUNTER — Ambulatory Visit (INDEPENDENT_AMBULATORY_CARE_PROVIDER_SITE_OTHER): Payer: Medicare Other | Admitting: Internal Medicine

## 2023-01-17 VITALS — BP 130/88 | HR 72 | Temp 98.2°F | Ht 70.0 in | Wt 244.4 lb

## 2023-01-17 DIAGNOSIS — I10 Essential (primary) hypertension: Secondary | ICD-10-CM | POA: Diagnosis not present

## 2023-01-17 DIAGNOSIS — E119 Type 2 diabetes mellitus without complications: Secondary | ICD-10-CM | POA: Diagnosis not present

## 2023-01-17 LAB — MICROALBUMIN / CREATININE URINE RATIO
Creatinine, Urine: 232 mg/dL (ref 20–320)
Microalb Creat Ratio: 3 mcg/mg creat (ref <30)
Microalb, Ur: 0.8 mg/dL

## 2023-01-17 LAB — HEPATIC FUNCTION PANEL
AG Ratio: 1.8 (calc) (ref 1.0–2.5)
ALT: 30 U/L (ref 9–46)
AST: 22 U/L (ref 10–35)
Albumin: 4.6 g/dL (ref 3.6–5.1)
Alkaline phosphatase (APISO): 44 U/L (ref 35–144)
Bilirubin, Direct: 0.1 mg/dL (ref 0.0–0.2)
Globulin: 2.6 g/dL (calc) (ref 1.9–3.7)
Indirect Bilirubin: 0.4 mg/dL (calc) (ref 0.2–1.2)
Total Bilirubin: 0.5 mg/dL (ref 0.2–1.2)
Total Protein: 7.2 g/dL (ref 6.1–8.1)

## 2023-01-17 LAB — LIPID PANEL
Cholesterol: 138 mg/dL (ref <200)
HDL: 54 mg/dL (ref >=40)
LDL Cholesterol (Calc): 58 mg/dL (calc)
Non-HDL Cholesterol (Calc): 84 mg/dL (calc) (ref <130)
Total CHOL/HDL Ratio: 2.6 (calc) (ref <5.0)
Triglycerides: 193 mg/dL — ABNORMAL HIGH (ref <150)

## 2023-01-17 LAB — HEMOGLOBIN A1C
Hgb A1c MFr Bld: 6.2 % of total Hgb — ABNORMAL HIGH (ref <5.7)
Mean Plasma Glucose: 131 mg/dL
eAG (mmol/L): 7.3 mmol/L

## 2023-01-17 NOTE — Progress Notes (Shared)
Subjective:    Patient ID: [name] [name] , male    DOB: [birth year], 67 y.o.    MRN: [id number]   67 y.o. male presents today for 6 month follow up of hypertension and diabetes.  He has been compliant with Benicar '20mg'$  daily and HCTZ '25mg'$  daily. He does not check his blood pressure at home.  He has been compliant with Januvia '50mg'$  daily and Ozempic weekly. His last A1c was 6.2 on 01/16/23. He states that his blood sugars have been consistent between 105 and 125 at home. Patient notes that he has stopped losing weight despite eating less food. He states that he rarely eats breakfast, but if he does he might eat a bran muffin and drink a cup of coffee with 1 sweet'n low and cream as well as a bottle of Glucerna. He eats a small lunch as well as a small dinner such as rice and a piece of chicken. He has been trying to eat healthier snacks especially with his recent travels. He is less active this time of year and not riding his bike as much due to the weather.  He reports compliance with Crestor '5mg'$  daily. His most recent lipid panel on 01/16/23 revealed total cholesterol of 138, triglycerides of 193, HDL of 54, and LDL of 58.  He continues to have right ear pressure and he hears popping. He denies any left ear pain or a sore throat.     Family History  Problem Relation Age of Onset   Breast cancer Mother    Prostate cancer Father    Cancer Father    Colon cancer Neg Hx    Colon polyps Neg Hx    Esophageal cancer Neg Hx    Stomach cancer Neg Hx    Rectal cancer Neg Hx     Past Medical History:  Diagnosis Date   Anxiety    mild- past  hx   Arthritis    Rt hip   Cancer ([place]) 04/2019   Skin Rt hand. Dr. Ronnald Ramp   Diabetes mellitus without complication ([place])    GERD (gastroesophageal reflux disease)    Tums or omeprozole   Heart murmur    History of IBS    History of kidney stones    30 years ago   Hypertension 2009   Meds controlled     Social History   Social History  Narrative   Not on file    Patient Care Team: Baxley, Cresenciano Lick, MD as PCP - General (Internal Medicine) Josue Hector, MD as PCP - Cardiology (Cardiology)   Review of Systems  Constitutional:  Negative for fever and malaise/fatigue.  HENT:  Positive for ear pain. Negative for congestion.   Eyes:  Negative for blurred vision.  Respiratory:  Negative for cough and shortness of breath.   Cardiovascular:  Negative for chest pain, palpitations and leg swelling.  Gastrointestinal:  Negative for vomiting.  Musculoskeletal:  Negative for back pain.  Skin:  Negative for rash.  Neurological:  Negative for loss of consciousness and headaches.    Objective:   Vitals: BP 130/88 (BP Location: Right Arm, Patient Position: Sitting)   Pulse 72   Temp 98.2 F (36.8 C) (Tympanic)   Ht '5\' 10"'$  (1.778 m)   Wt 244 lb 6.4 oz (110.9 kg)   SpO2 99%   BMI 35.07 kg/m    Physical Exam Vitals and nursing note reviewed.  Constitutional:      General: He is awake.  He is not in acute distress.    Appearance: Normal appearance. He is not ill-appearing or toxic-appearing.  HENT:     Head: Atraumatic.     Right Ear: A middle ear effusion is present.     Left Ear: A middle ear effusion is present.  Cardiovascular:     Rate and Rhythm: Normal rate and regular rhythm. No extrasystoles are present.    Pulses: Normal pulses.     Heart sounds: No murmur heard. Pulmonary:     Effort: Pulmonary effort is normal.     Breath sounds: Normal breath sounds. No decreased breath sounds, wheezing, rhonchi or rales.  Musculoskeletal:     Cervical back: Normal range of motion.     Right lower leg: No edema.     Left lower leg: No edema.  Skin:    General: Skin is warm.  Neurological:     General: No focal deficit present.     Mental Status: He is alert and oriented to person, place, and time. Mental status is at baseline.  Psychiatric:        Mood and Affect: Mood normal.        Behavior: Behavior normal.  Behavior is cooperative.        Thought Content: Thought content normal.        Judgment: Judgment normal.         Assessment & Plan:   Diabetes mellitus: I encouraged him to increase his activity level and continue with a healthy diet. Continue Januvia '50mg'$  daily. He is going to try and slowly increase his Ozempic dose.  Hypertension: Continue Benicar '20mg'$  daily and HCTZ '25mg'$  daily. I advised him to begin checking his blood pressures at home.  Plan: Follow up for OV in 3 months for A1c recheck.   I,Alexis Herring,acting as a Education administrator for Elby Showers, MD.,have documented all relevant documentation on the behalf of Elby Showers, MD,as directed by  Elby Showers, MD while in the presence of Elby Showers, MD.   I, Elby Showers, MD, have reviewed all documentation for this visit. The documentation on 01/18/23 for the exam, diagnosis, procedures, and orders are all accurate and complete.  IElby Showers, MD, have reviewed all documentation for this visit. The documentation on 01/18/23 for the exam, diagnosis, procedures, and orders are all accurate and complete.

## 2023-01-18 NOTE — Patient Instructions (Addendum)
He will continue Benicar 20 mg daily and HCTZ 25 mg daily.  He should check his blood pressure at home and let me know if it is elevated.  He is to increase his activity level and continue with healthy diet.  Continue Januvia and increase Ozempic dose slightly.  Follow-up in 3 months.

## 2023-01-31 ENCOUNTER — Encounter: Payer: Self-pay | Admitting: Internal Medicine

## 2023-02-01 ENCOUNTER — Ambulatory Visit: Payer: Medicare Other | Attending: Cardiovascular Disease

## 2023-02-01 DIAGNOSIS — I350 Nonrheumatic aortic (valve) stenosis: Secondary | ICD-10-CM

## 2023-02-01 LAB — ECHOCARDIOGRAM COMPLETE
AR max vel: 2.18 cm2
AV Area VTI: 2.04 cm2
AV Area mean vel: 1.81 cm2
AV Mean grad: 11.5 mmHg
AV Peak grad: 20.5 mmHg
Ao pk vel: 2.27 m/s
Area-P 1/2: 3.15 cm2
Calc EF: 55.8 %
S' Lateral: 3.4 cm
Single Plane A2C EF: 52.1 %
Single Plane A4C EF: 59.8 %

## 2023-02-17 ENCOUNTER — Encounter: Payer: Self-pay | Admitting: Internal Medicine

## 2023-02-17 ENCOUNTER — Ambulatory Visit (INDEPENDENT_AMBULATORY_CARE_PROVIDER_SITE_OTHER): Payer: Medicare Other | Admitting: Internal Medicine

## 2023-02-17 VITALS — BP 126/88 | HR 80 | Ht 70.0 in | Wt 223.0 lb

## 2023-02-17 DIAGNOSIS — E119 Type 2 diabetes mellitus without complications: Secondary | ICD-10-CM | POA: Diagnosis not present

## 2023-02-17 LAB — POCT GLUCOSE (DEVICE FOR HOME USE): POC Glucose: 121 mg/dl — AB (ref 70–99)

## 2023-02-17 MED ORDER — SEMAGLUTIDE (1 MG/DOSE) 4 MG/3ML ~~LOC~~ SOPN: 1.0000 mg | PEN_INJECTOR | SUBCUTANEOUS | 3 refills | Status: DC

## 2023-02-17 NOTE — Patient Instructions (Signed)
Increase Ozempic 1 mg weekly

## 2023-02-17 NOTE — Progress Notes (Signed)
Name: [name] [name]  MRN/ DOB: [id number], 10-May-1956   Age/ Sex: 67 y.o., male    PCP: Elby Showers, MD   Reason for Endocrinology Evaluation: Type 2 Diabetes Mellitus     Date of Initial Endocrinology Visit: 02/17/2023     PATIENT IDENTIFIER: [name] [name] is a 67 y.o. male with a past medical history of DM, CAD, IBS . The patient presented for initial endocrinology clinic visit on 02/17/2023 for consultative assistance with his diabetes management.    HPI: [name] [name] was    Diagnosed with DM 2020 Prior Medications tried/Intolerance: Januvia - no intolerance  Currently checking blood sugars 1 x / day  Hemoglobin A1c has ranged from 5.9% in 2022, peaking at 7.3 in 2020.   In terms of diet, the patient eats 2 meals a day  Denies nausea, vomiting or diarrhea but has heartburn  He has noted improvement in chronic diarrhea on Ozempic     HOME DIABETES REGIMEN: Ozempic 0.5 mg weekly    Statin: yes ACE-I/ARB: yes    METER DOWNLOAD SUMMARY: n/a   DIABETIC COMPLICATIONS: Microvascular complications:   Denies: CKD, retinopathy , neuropathy  Last eye exam: Completed 01/2022  Macrovascular complications:   Denies: PVD, CVA, CAD   PAST HISTORY: Past Medical History:  Past Medical History:  Diagnosis Date   Anxiety    mild- past  hx   Arthritis    Rt hip   Cancer ([facility]) 04/2019   Skin Rt hand. Dr. Ronnald Ramp   Diabetes mellitus without complication ([place])    GERD (gastroesophageal reflux disease)    Tums or omeprozole   Heart murmur    History of IBS    History of kidney stones    30 years ago   Hypertension 2009   Meds controlled   Past Surgical History:  Past Surgical History:  Procedure Laterality Date   COLONOSCOPY  2005   has IBS   LEFT HEART CATH AND CORONARY ANGIOGRAPHY N/A 04/12/2021   Procedure: LEFT HEART CATH AND CORONARY ANGIOGRAPHY;  Surgeon: Belva Crome, MD;  Location: [facility];  Service: Cardiovascular;  Laterality: N/A;    renal stone     TOTAL HIP ARTHROPLASTY Right 05/10/2019   Procedure: RIGHT TOTAL HIP ARTHROPLASTY ANTERIOR APPROACH;  Surgeon: Mcarthur Rossetti, MD;  Location: [facility];  Service: Orthopedics;  Laterality: Right;    Social History:  reports that he has never smoked. He has never used smokeless tobacco. He reports current alcohol use of about 2.0 - 3.0 standard drinks of alcohol per week. He reports that he does not use drugs. Family History:  Family History  Problem Relation Age of Onset   Breast cancer Mother    Prostate cancer Father    Cancer Father    Colon cancer Neg Hx    Colon polyps Neg Hx    Esophageal cancer Neg Hx    Stomach cancer Neg Hx    Rectal cancer Neg Hx      HOME MEDICATIONS: Allergies as of 02/17/2023       Reactions   Iohexol     Code: RASH, Desc: PER MARY SILER ([place] [facility])STATES PT HAD REACTION TO CONTRAST MANY YRS PRIOR. 01/05/09/RM, Onset Date: [id number]   Miralax [polyethylene Glycol] Other (See Comments)   Caused severe abdominal pain with bowel prep ( 238 grams )    Shellfish Allergy Nausea And Vomiting        Medication List  Accurate as of February 17, 2023  1:48 PM. If you have any questions, ask your nurse or doctor.          Accu-Chek FastClix Lancets Misc USE TO TEST BLOOD GLUCOSE BEFORE BREAKFAST AND SUPPER   Accu-Chek Guide test strip Generic drug: glucose blood USE TO TEST BLOOD GLUCOSE BEFORE BREAKFAST AND SUPPER   ALPRAZolam 0.5 MG tablet Commonly known as: XANAX TAKE ONE TABLET BY MOUTH THREE TIMES A DAY   aspirin EC 81 MG tablet Take 81 mg by mouth daily. Swallow whole.   buPROPion 150 MG 24 hr tablet Commonly known as: WELLBUTRIN XL TAKE ONE TABLET BY MOUTH ONE TIME DAILY   celecoxib 200 MG capsule Commonly known as: CELEBREX TAKE ONE CAPSULE BY MOUTH TWICE A DAY   hydrochlorothiazide 25 MG tablet Commonly known as: HYDRODIURIL TAKE ONE TABLET BY MOUTH ONE TIME DAILY WITH LOSARTAN   Januvia 50 MG  tablet Generic drug: sitaGLIPtin TAKE ONE TABLET BY MOUTH ONE TIME DAILY   olmesartan 20 MG tablet Commonly known as: BENICAR TAKE ONE TABLET BY MOUTH ONE TIME DAILY   Ozempic (0.25 or 0.5 MG/DOSE) 2 MG/3ML Sopn Generic drug: Semaglutide(0.25 or 0.'5MG'$ /DOS) INJECT 0.25 MG UNDER THE SKIN ONCE WEEKLY FOR 4 WEEKS, THEN INCREASE TO INJECT 0.5 MG UNDER THE SKIN ONCE WEEKLY   rosuvastatin 5 MG tablet Commonly known as: CRESTOR TAKE ONE TABLET BY MOUTH ONE TIME DAILY   sildenafil 100 MG tablet Commonly known as: VIAGRA TAKE 1/2 TO 1 TABLET BY MOUTH DAILY AS NEEDED FOR FOR ERECTILE DYSFUNCTION   tadalafil 20 MG tablet Commonly known as: CIALIS TAKE ONE-HALF TO ONE TABLET BY MOUTH EVERY OTHER DAY AS NEEDED FOR ERECTILE DYSFUNCTION         ALLERGIES: Allergies  Allergen Reactions   Iohexol      Code: RASH, Desc: PER MARY SILER ([facility])STATES PT HAD REACTION TO CONTRAST MANY YRS PRIOR. 01/05/09/RM, Onset Date: [id number]    Miralax [Polyethylene Glycol] Other (See Comments)    Caused severe abdominal pain with bowel prep ( 238 grams )    Shellfish Allergy Nausea And Vomiting     REVIEW OF SYSTEMS: A comprehensive ROS was conducted with the patient and is negative except as per HPI     OBJECTIVE:   VITAL SIGNS: BP 126/88 (BP Location: Left Arm, Patient Position: Sitting, Cuff Size: Large)   Pulse 80   Ht '5\' 10"'$  (1.778 m)   Wt 223 lb (101.2 kg)   SpO2 98%   BMI 32.00 kg/m    PHYSICAL EXAM:  General: Pt appears well and is in NAD  Lungs: Clear with good BS bilat with no rales, rhonchi, or wheezes  Heart: RRR   Abdomen:  soft, nontender  Extremities:  Lower extremities - No pretibial edema. No lesions.  Neuro: MS is good with appropriate affect, pt is alert and Ox3    DM foot exam: 02/17/2023  The skin of the feet is intact without sores or ulcerations. The pedal pulses are 2+ on right and 2+ on left. The sensation is intact to a screening 5.07, 10 gram  monofilament bilaterally   DATA REVIEWED:  Lab Results  Component Value Date   HGBA1C 6.2 (H) 01/16/2023   HGBA1C 5.8 (H) 07/11/2022   HGBA1C 5.9 (H) 12/30/2021    Latest Reference Range & Units 01/16/23 10:10  Mean Plasma Glucose mg/dL 131  AG Ratio 1.0 - 2.5 (calc) 1.8  AST 10 - 35 U/L 22  ALT 9 - 46 U/L 30  Total Protein 6.1 - 8.1 g/dL 7.2  Bilirubin, Direct 0.0 - 0.2 mg/dL 0.1  Indirect Bilirubin 0.2 - 1.2 mg/dL (calc) 0.4  Total Bilirubin 0.2 - 1.2 mg/dL 0.5  Total CHOL/HDL Ratio <5.0 (calc) 2.6  Cholesterol <200 mg/dL 138  HDL Cholesterol > OR = 40 mg/dL 54  LDL Cholesterol (Calc) mg/dL (calc) 58  MICROALB/CREAT RATIO <30 mcg/mg creat 3  Non-HDL Cholesterol (Calc) <130 mg/dL (calc) 84  Triglycerides <150 mg/dL 193 (H)    ASSESSMENT / PLAN / RECOMMENDATIONS:   1) Type 2 Diabetes Mellitus, Optimally controlled, Without complications - Most recent A1c of 6.2 %. Goal A1c < 7.0 %.    -His A1c is optimal, but the patient is interested in increasing Ozempic for weight loss purposes.  We did discuss the importance of lifestyle changes as medication itself would not be sustainable long-term -He is tolerating current dose of Ozempic, will increase -We discussed goal of exercise 175 minutes a week for weight loss purposes  MEDICATIONS: Increase Ozempic 1 mg weekly  EDUCATION / INSTRUCTIONS: BG monitoring instructions: Patient is instructed to check his blood sugars 2-3 times a week.    2) Diabetic complications:  Eye: Does not have known diabetic retinopathy.  Neuro/ Feet: Does not have known diabetic peripheral neuropathy. Renal: Patient does not have known baseline CKD. He is on an ACEI/ARB at present.   Follow-up in 6 months  Signed electronically by: Mack Guise, MD  [facility]  [facility] [address] [place], [place] [address] Phone: [phone] FAX: [phone]   CC: Elby Showers, MD [address] [place] [id number] Phone: [phone]  Fax: [phone]    Return to Endocrinology clinic as below: Future Appointments  Date Time Provider Caroline  04/17/2023 10:00 AM [facility] MJB-MJB MJB  04/18/2023 10:30 AM Elby Showers, MD [facility]-MJB MJB  07/17/2023 10:00 AM [facility] MJB-MJB MJB  07/18/2023  3:00 PM Baxley, Cresenciano Lick, MD MJB-MJB MJB

## 2023-03-02 ENCOUNTER — Encounter: Payer: Self-pay | Admitting: Radiology

## 2023-03-07 ENCOUNTER — Other Ambulatory Visit: Payer: Self-pay | Admitting: Internal Medicine

## 2023-04-11 NOTE — Progress Notes (Shared)
Patient Care Team: Margaree Mackintosh, MD as PCP - General (Internal Medicine) Wendall Stade, MD as PCP - Cardiology (Cardiology)  Visit Date: 04/11/23  Subjective:    Patient ID: [name] [name] , Male   DOB: 11-Jul-1956, 67 y.o.    MRN: [id number]   67 y.o. Male presents today for a 3 month follow-up. Patient has a past medical history of anxiety, arthritis, skin cancer 2020, Type 2 diabetes mellitus, GERD, heart murmur, IBS, kidney stones, hypertension.  History of Type 2 diabetes mellitus treated with semaglutide 1 mg weekly.  History of hyperlipidemia treated with resuvastatin 5 mg daily.  History of hypertension treated with hydrochlorothiazide 25 mg daily, olmesartan 20 mg daily.  History of musculoskeletal pain treated with celecoxib 200 mg twice daily.  History of anxiety and depression treated with alprazolam 0.5 mg three times daily, bupropion 150 mg daily.    Past Medical History:  Diagnosis Date   Anxiety    mild- past  hx   Arthritis    Rt hip   Cancer ([facility]) 04/2019   Skin Rt hand. Dr. Yetta Barre   Diabetes mellitus without complication ([facility])    GERD (gastroesophageal reflux disease)    Tums or omeprozole   Heart murmur    History of IBS    History of kidney stones    30 years ago   Hypertension 2009   Meds controlled     Family History  Problem Relation Age of Onset   Breast cancer Mother    Prostate cancer Father    Cancer Father    Colon cancer Neg Hx    Colon polyps Neg Hx    Esophageal cancer Neg Hx    Stomach cancer Neg Hx    Rectal cancer Neg Hx     Social History   Social History Narrative   Not on file      ROS      Objective:   Vitals: There were no vitals taken for this visit.   Physical Exam    Results:   Studies obtained and personally reviewed by me:  Imaging, colonoscopy, mammogram, bone density scan, echocardiogram, heart cath, stress test, CT calcium score, etc. ***   Labs:       Component Value Date/Time    NA 140 07/11/2022 1012   NA 139 04/08/2021 1401   K 4.3 07/11/2022 1012   CL 102 07/11/2022 1012   CO2 27 07/11/2022 1012   GLUCOSE 124 (H) 07/11/2022 1012   BUN 17 07/11/2022 1012   BUN 16 04/08/2021 1401   CREATININE 1.08 07/11/2022 1012   CALCIUM 9.3 07/11/2022 1012   PROT 7.2 01/16/2023 1010   ALBUMIN 4.2 08/16/2016 1147   AST 22 01/16/2023 1010   ALT 30 01/16/2023 1010   ALKPHOS 44 08/16/2016 1147   BILITOT 0.5 01/16/2023 1010   GFRNONAA 89 05/17/2021 1143   GFRAA 103 05/17/2021 1143     Lab Results  Component Value Date   WBC 4.3 07/11/2022   HGB 15.0 07/11/2022   HCT 42.1 07/11/2022   MCV 93.8 07/11/2022   PLT 176 07/11/2022    Lab Results  Component Value Date   CHOL 138 01/16/2023   HDL 54 01/16/2023   LDLCALC 58 01/16/2023   TRIG 193 (H) 01/16/2023   CHOLHDL 2.6 01/16/2023    Lab Results  Component Value Date   HGBA1C 6.2 (H) 01/16/2023     No results found for: "TSH"   Lab Results  Component Value Date   PSA 1.81 07/11/2022   PSA 1.51 05/17/2021   PSA 1.6 05/11/2020   *** delete for male pts  ***    Assessment & Plan:   ***    I,Alexander Ruley,acting as a scribe for Margaree Mackintosh, MD.,have documented all relevant documentation on the behalf of Margaree Mackintosh, MD,as directed by  Margaree Mackintosh, MD while in the presence of Margaree Mackintosh, MD.   ***

## 2023-04-17 ENCOUNTER — Other Ambulatory Visit: Payer: Medicare Other

## 2023-04-17 DIAGNOSIS — E119 Type 2 diabetes mellitus without complications: Secondary | ICD-10-CM

## 2023-04-18 ENCOUNTER — Ambulatory Visit: Payer: Medicare Other | Admitting: Internal Medicine

## 2023-04-20 NOTE — Progress Notes (Shared)
Patient Care Team: Margaree Mackintosh, MD as PCP - General (Internal Medicine) Wendall Stade, MD as PCP - Cardiology (Cardiology)  Visit Date: 04/20/23  Subjective:    Patient ID: [name] [name] , Male   DOB: [birth year], 67 y.o.    MRN: [id number]   67 y.o. Male presents today for a 3 month follow-up. Patient has a past medical history of anxiety, arthritis, skin cancer right hand 2020, Type 2 diabetes mellitus, GERD, heart murmur, IBS, kidney stones, hypertension.  History of anxiety and depression treated with alprazolam 0.5 mg three times daily, bupropion 150 mg daily.  History of hypertension treated with hydrochlorothiazide 25 mg daily, olmesartan 20 mg daily.  History of hyperlipidemia treated with rosuvastatin 5 mg daily.  History of Type 2 diabetes treated with semaglutide 1 mg weekly.  History of arthritis treated with celecoxib 200 mg twice daily.     Past Medical History:  Diagnosis Date   Anxiety    mild- past  hx   Arthritis    Rt hip   Cancer ([facility]) 04/2019   Skin Rt hand. Dr. Yetta Barre   Diabetes mellitus without complication ([facility])    GERD (gastroesophageal reflux disease)    Tums or omeprozole   Heart murmur    History of IBS    History of kidney stones    30 years ago   Hypertension 2009   Meds controlled     Family History  Problem Relation Age of Onset   Breast cancer Mother    Prostate cancer Father    Cancer Father    Colon cancer Neg Hx    Colon polyps Neg Hx    Esophageal cancer Neg Hx    Stomach cancer Neg Hx    Rectal cancer Neg Hx     Social History   Social History Narrative   Not on file      ROS      Objective:   Vitals: There were no vitals taken for this visit.   Physical Exam    Results:   Studies obtained and personally reviewed by me:  Imaging, colonoscopy, mammogram, bone density scan, echocardiogram, heart cath, stress test, CT calcium score, etc. ***   Labs:       Component Value Date/Time   NA  140 07/11/2022 1012   NA 139 04/08/2021 1401   K 4.3 07/11/2022 1012   CL 102 07/11/2022 1012   CO2 27 07/11/2022 1012   GLUCOSE 124 (H) 07/11/2022 1012   BUN 17 07/11/2022 1012   BUN 16 04/08/2021 1401   CREATININE 1.08 07/11/2022 1012   CALCIUM 9.3 07/11/2022 1012   PROT 7.2 01/16/2023 1010   ALBUMIN 4.2 08/16/2016 1147   AST 22 01/16/2023 1010   ALT 30 01/16/2023 1010   ALKPHOS 44 08/16/2016 1147   BILITOT 0.5 01/16/2023 1010   GFRNONAA 89 05/17/2021 1143   GFRAA 103 05/17/2021 1143     Lab Results  Component Value Date   WBC 4.3 07/11/2022   HGB 15.0 07/11/2022   HCT 42.1 07/11/2022   MCV 93.8 07/11/2022   PLT 176 07/11/2022    Lab Results  Component Value Date   CHOL 138 01/16/2023   HDL 54 01/16/2023   LDLCALC 58 01/16/2023   TRIG 193 (H) 01/16/2023   CHOLHDL 2.6 01/16/2023    Lab Results  Component Value Date   HGBA1C 6.2 (H) 01/16/2023     No results found for: "TSH"   Lab Results  Component Value Date   PSA 1.81 07/11/2022   PSA 1.51 05/17/2021   PSA 1.6 05/11/2020   *** delete for male pts  ***    Assessment & Plan:   ***    I,Alexander Ruley,acting as a scribe for Margaree Mackintosh, MD.,have documented all relevant documentation on the behalf of Margaree Mackintosh, MD,as directed by  Margaree Mackintosh, MD while in the presence of Margaree Mackintosh, MD.   ***

## 2023-04-25 ENCOUNTER — Other Ambulatory Visit: Payer: Medicare Other

## 2023-04-25 DIAGNOSIS — E119 Type 2 diabetes mellitus without complications: Secondary | ICD-10-CM

## 2023-04-26 LAB — HEMOGLOBIN A1C
Hgb A1c MFr Bld: 5.8 % of total Hgb — ABNORMAL HIGH (ref <5.7)
Mean Plasma Glucose: 120 mg/dL
eAG (mmol/L): 6.6 mmol/L

## 2023-04-27 ENCOUNTER — Ambulatory Visit: Payer: Medicare Other | Admitting: Internal Medicine

## 2023-05-01 ENCOUNTER — Ambulatory Visit (INDEPENDENT_AMBULATORY_CARE_PROVIDER_SITE_OTHER): Payer: Medicare Other | Admitting: Internal Medicine

## 2023-05-01 ENCOUNTER — Encounter: Payer: Self-pay | Admitting: Internal Medicine

## 2023-05-01 ENCOUNTER — Telehealth: Payer: Self-pay | Admitting: Internal Medicine

## 2023-05-01 VITALS — BP 120/80 | HR 71 | Temp 97.8°F | Ht 70.0 in | Wt 221.8 lb

## 2023-05-01 DIAGNOSIS — E119 Type 2 diabetes mellitus without complications: Secondary | ICD-10-CM

## 2023-05-01 DIAGNOSIS — E782 Mixed hyperlipidemia: Secondary | ICD-10-CM

## 2023-05-01 DIAGNOSIS — Z23 Encounter for immunization: Secondary | ICD-10-CM | POA: Diagnosis not present

## 2023-05-01 DIAGNOSIS — I10 Essential (primary) hypertension: Secondary | ICD-10-CM

## 2023-05-01 DIAGNOSIS — E1169 Type 2 diabetes mellitus with other specified complication: Secondary | ICD-10-CM | POA: Diagnosis not present

## 2023-05-01 DIAGNOSIS — K580 Irritable bowel syndrome with diarrhea: Secondary | ICD-10-CM

## 2023-05-01 MED ORDER — PANTOPRAZOLE SODIUM 40 MG PO TBEC
40.0000 mg | DELAYED_RELEASE_TABLET | Freq: Every day | ORAL | 3 refills | Status: DC
Start: 2023-05-01 — End: 2024-02-19

## 2023-05-01 MED ORDER — PROMETHAZINE HCL 25 MG PO TABS: 25.0000 mg | ORAL_TABLET | Freq: Three times a day (TID) | ORAL | 0 refills | Status: AC | PRN

## 2023-05-01 MED ORDER — HYDROCORTISONE ACE-PRAMOXINE 1-1 % EX CREA: 1.0000 | TOPICAL_CREAM | Freq: Two times a day (BID) | CUTANEOUS | 0 refills | Status: DC

## 2023-05-01 NOTE — Progress Notes (Signed)
   Subjective:    Patient ID: [name] [name], male    DOB: 10-31-1956, 67 y.o.   MRN: [id number]  HPI Patient will be due for Annual Medicare wellness visit this coming summer.  He is here for follow-up on Type 2 diabetes mellitus.  He is currently on Ozempic and is doing well with diet.  He is pleased with the medication and with his weight.  He has a history of essential hypertension treated with Benicar 20 mg daily and HCTZ 25 mg daily.  Takes Protonix for GE reflux.  Is on low-dose rosuvastatin 5 mg daily.  He did see Dr. Lonzo Cloud, endocrinologist for an initial visit in February 2024.  Patient has been reminded today about annual diabetic eye exam.  Currently on Ozempic 1 mg weekly.  Previously treated with Januvia.  His hemoglobin A1c was 6.2% in January and is now 5.8% in April.  Lipid panel not checked with this visit.  Main issue has been hypertriglyceridemia.  Has been able to increase his HDL from 46 in July 20 23-54 in January 2024.  History of mild aortic stenosis.  Coronary calcium score of 587 in March 2022.  Had echocardiogram by Dr.Nishan   in February showing mild aortic stenosis with follow-up recommended in 1 year.  Patient had colonoscopy in 2023 and due again in 2026  Review of Systems denies chest pain or shortness of breath     Objective:   Physical Exam  BP 120/80, pulse 71, T 97.8 degrees, pulse ox 99%, Weight 221 lb 12.8 oz BMI 31.82  Skin: warm and dry. Nodes:none. Chest is clear.  Cardiac exam: Regular rate and rhythm.  No carotid bruits.  No thyromegaly.      Assessment & Plan:  Type 2 diabetes mellitus well-controlled  History of mild aortic stenosis followed by Dr. Eden Emms  Hyperlipidemia stable on statin  BMI 31.82 stable on Ozempic  Essential hypertension stable  Pneumococcal 20 vaccine given  Hx of irritable bowel syndrome  Plan: Due for Medicare wellness visit in July 2024

## 2023-05-01 NOTE — Telephone Encounter (Signed)
Called Dr Edward Qualia office and requested patients last eye exam for our records and they said they would fax right over.  Phone # [phone] Fax [phone]

## 2023-05-01 NOTE — Telephone Encounter (Signed)
received

## 2023-05-04 ENCOUNTER — Other Ambulatory Visit: Payer: Self-pay | Admitting: Internal Medicine

## 2023-05-08 ENCOUNTER — Other Ambulatory Visit: Payer: Self-pay

## 2023-05-08 MED ORDER — HYDROCORTISONE (PERIANAL) 2.5 % EX CREA: 1.0000 | TOPICAL_CREAM | Freq: Two times a day (BID) | CUTANEOUS | 0 refills | Status: AC

## 2023-05-17 NOTE — Patient Instructions (Signed)
It was a pleasure to see you today.  Labs are stable.  Return in July for Medicare wellness visit.

## 2023-05-23 ENCOUNTER — Other Ambulatory Visit: Payer: Self-pay | Admitting: Internal Medicine

## 2023-06-03 ENCOUNTER — Other Ambulatory Visit: Payer: Self-pay | Admitting: Internal Medicine

## 2023-06-22 ENCOUNTER — Ambulatory Visit: Payer: Medicare Other | Admitting: Physician Assistant

## 2023-07-06 ENCOUNTER — Other Ambulatory Visit: Payer: Self-pay | Admitting: Internal Medicine

## 2023-07-17 ENCOUNTER — Other Ambulatory Visit: Payer: Medicare Other

## 2023-07-17 DIAGNOSIS — E1169 Type 2 diabetes mellitus with other specified complication: Secondary | ICD-10-CM

## 2023-07-17 DIAGNOSIS — Z125 Encounter for screening for malignant neoplasm of prostate: Secondary | ICD-10-CM

## 2023-07-17 DIAGNOSIS — I10 Essential (primary) hypertension: Secondary | ICD-10-CM

## 2023-07-17 DIAGNOSIS — Z79899 Other long term (current) drug therapy: Secondary | ICD-10-CM

## 2023-07-17 DIAGNOSIS — E119 Type 2 diabetes mellitus without complications: Secondary | ICD-10-CM

## 2023-07-17 LAB — CBC WITH DIFFERENTIAL/PLATELET
Basophils Relative: 0.7 %
Eosinophils Absolute: 79 cells/uL (ref 15–500)
Eosinophils Relative: 1.8 %
HCT: 41.9 % (ref 38.5–50.0)
Lymphs Abs: 1874 cells/uL (ref 850–3900)
MCHC: 35.6 g/dL (ref 32.0–36.0)
MPV: 9.4 fL (ref 7.5–12.5)
RDW: 13.1 % (ref 11.0–15.0)

## 2023-07-17 NOTE — Progress Notes (Signed)
Annual Wellness Visit    Patient Care Team: Maysoon Lozada, Luanna Cole, MD as PCP - General (Internal Medicine) Wendall Stade, MD as PCP - Cardiology (Cardiology)  Visit Date: 07/18/23   Chief Complaint  Patient presents with   Medicare Annual Wellness Visit   Annual Exam    Subjective:   Patient: [name] [name], Male    DOB: 1956-12-08, 67 y.o.   MRN: [id number]  [name] [name] is a 67 y.o. Male who presents today for his Annual Wellness Visit.  His main complaint is ongoing arthritic pain in his left shoulder. He reports having an MRI of his left shoulder that showed a slight rotator cuff tear. At this time he has elected not to pursue surgical treatment.   Additionally he complains of tinnitus that has been more bothersome lately, describes it as a "steady buzz." His symptoms will worsen with actions such as chewing or with yawning. He denies any dizziness or lightheadedness.  We reviewed his lab work as of 07/17/2023. Hgb A1c 5.7%, microalbumin/creatinine ratio normal. CBC showed elevated MCH at 33.3, otherwise within normal limits. CMP with glucose 108, BUN 16, creatinine 1.02, sodium 140, potassium 4.1, ALT 26. Lipid panel showed total cholesterol 126, HDL 51, triglycerides 126, LDL 54. PSA 1.37.  History of type 2 diabetes mellitus, stable on Januvia. His weight has been stable lately. At home he was 213 lbs this morning, down from 221 lbs in 04/2023.  History of essential hypertension, stable on current medications HCTZ 25 mg daily, olmesartan 20 mg daily. In the office today his blood pressure is well controlled at 124/82.  History of Left AC joint inflammation, treated with Celebrex.  History of mild depression, treated with Wellbutrin.   History of anxiety treated with Xanax, which also treats his irritable bowel syndrome.  History of recurrent bouts of diverticulitis but none recently.  History of irritable bowel syndrome controlled with generic Xanax up to 3 times daily.   Colonoscopy was done in February 2023 by Dr. Leonides Schanz.  5 small polyps removed.  These were adenomatous (precancerous) colon polyps. Next colonoscopy due 02/07/2025.  History of nonobstructive CAD followed by Dr. Eden Emms.  Had TTE February 2022 showing mild aortic stenosis with mean gradient of 10 mmHg.  He was started on Imdur and Toprol.  He also had coronary catheterization by Dr. Katrinka Blazing in April 2022 showing right dominance of the coronary arteries.  LAD had luminal irregularities.  Overall widely patent coronary arteries without obstructive disease. Repeat echocardiogram 01/2023 showed LVEF 55-60%, grade 1 diastolic dysfunction, trivial mitral valve regurgitation, mild aortic valve stenosis.  In the fall 2019 he suffered an injury to the right median nerve and sprained his left wrist when the horse he was riding jump suddenly injuring his hand.  It took some time for him to improve and he saw Dr. Betha Loa.  He had a right total hip arthroplasty in May 2020 by Dr. Magnus Ivan.  Social history: Non-smoker.  Social alcohol consumption.  May have 1-2 drinks of Vodka. Married.  Wife retired from [employer].  He has 1 adult son from previous marriage.  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 He continues to enjoy horse riding.  Family history: Father died with complications of prostate cancer.  Mother died with complications of dementia.  1 brother.   Past Medical History:  Diagnosis Date   Anxiety    mild- past  hx   Arthritis  Rt hip   Cancer (HCC) 04/2019   Skin Rt hand. Dr. Yetta Barre   Diabetes mellitus without complication (HCC)    GERD (gastroesophageal reflux disease)    Tums or omeprozole   Heart murmur    History of IBS    History of kidney stones    30 years ago   Hypertension 2009   Meds controlled     Family History  Problem Relation Age of Onset   Breast cancer Mother    Prostate cancer Father    Cancer Father    Colon cancer Neg Hx    Colon  polyps Neg Hx    Esophageal cancer Neg Hx    Stomach cancer Neg Hx    Rectal cancer Neg Hx      Social History   Social History Narrative   Not on file     Review of Systems  Constitutional:  Negative for fever and malaise/fatigue.  HENT:  Positive for tinnitus. Negative for congestion.   Eyes:  Negative for blurred vision.  Respiratory:  Negative for cough and shortness of breath.   Cardiovascular:  Negative for chest pain, palpitations and leg swelling.  Gastrointestinal:  Negative for vomiting.  Musculoskeletal:  Positive for joint pain (Left shoulder). Negative for back pain.  Skin:  Negative for rash.  Neurological:  Negative for loss of consciousness and headaches.      Objective:   Vitals: BP 124/82 (BP Location: Left Arm, Patient Position: Sitting, Cuff Size: Large)   Pulse 75   Temp 98.2 F (36.8 C) (Tympanic)   Ht 5\' 11"  (1.803 m)   Wt 218 lb (98.9 kg)   SpO2 99%   BMI 30.40 kg/m   Physical Exam Vitals and nursing note reviewed. Exam conducted with a chaperone present.  Constitutional:      General: He is awake. He is not in acute distress.    Appearance: Normal appearance. He is not ill-appearing or toxic-appearing.  HENT:     Head: Normocephalic and atraumatic.     Right Ear: Tympanic membrane, ear canal and external ear normal.     Left Ear: Tympanic membrane, ear canal and external ear normal.     Mouth/Throat:     Pharynx: Oropharynx is clear.  Eyes:     Extraocular Movements: Extraocular movements intact.     Pupils: Pupils are equal, round, and reactive to light.  Neck:     Thyroid: No thyroid mass, thyromegaly or thyroid tenderness.     Vascular: No carotid bruit.     Comments: No supraclavicular adenopathy. Cardiovascular:     Rate and Rhythm: Normal rate and regular rhythm. No extrasystoles are present.    Heart sounds: Normal heart sounds. No murmur heard.    No friction rub. No gallop.     Comments: PT and DP pulses normal  bilaterally. Pulmonary:     Effort: Pulmonary effort is normal.     Breath sounds: Normal breath sounds. No decreased breath sounds, wheezing, rhonchi or rales.  Chest:     Chest wall: No mass.  Abdominal:     Palpations: Abdomen is soft. There is no hepatomegaly, splenomegaly or mass.     Tenderness: There is no abdominal tenderness.     Hernia: No hernia is present.  Genitourinary:    Prostate: Normal.     Rectum: Normal. Guaiac result negative.  Musculoskeletal:     Cervical back: Normal range of motion.     Right lower leg: No edema.  Left lower leg: No edema.  Lymphadenopathy:     Cervical: No cervical adenopathy.     Upper Body:     Right upper body: No supraclavicular adenopathy.     Left upper body: No supraclavicular adenopathy.  Skin:    General: Skin is warm and dry.  Neurological:     General: No focal deficit present.     Mental Status: He is alert and oriented to person, place, and time. Mental status is at baseline.     Cranial Nerves: Cranial nerves 2-12 are intact.     Sensory: Sensation is intact.     Motor: Motor function is intact.     Coordination: Coordination is intact.     Gait: Gait is intact.     Deep Tendon Reflexes: Reflexes are normal and symmetric.  Psychiatric:        Attention and Perception: Attention normal.        Mood and Affect: Mood normal.        Speech: Speech normal.        Behavior: Behavior normal. Behavior is cooperative.        Thought Content: Thought content normal.        Cognition and Memory: Cognition and memory normal.        Judgment: Judgment normal.      Most recent functional status assessment:     No data to display         Most recent fall risk assessment:    07/18/2023    3:25 PM  Fall Risk   Falls in the past year? 0  Risk for fall due to : No Fall Risks    Most recent depression screenings:    05/01/2023   11:26 AM 01/17/2023   10:24 AM  PHQ 2/9 Scores  PHQ - 2 Score 0 0   Most recent  cognitive screening:    07/12/2022    2:11 PM  6CIT Screen  What Year? 0 points  What month? 0 points  What time? 0 points  Count back from 20 0 points  Months in reverse 0 points  Repeat phrase 0 points  Total Score 0 points     Results:   Studies obtained and personally reviewed by me:  Colonoscopy was done in February 2023 by Dr. Leonides Schanz.  5 small polyps removed.  These were adenomatous (precancerous) colon polyps. Next colonoscopy due 02/07/2025.  Echocardiogram 02/01/2023:  1. Left ventricular ejection fraction, by estimation, is 55 to 60%. Left  ventricular ejection fraction by 2D MOD biplane is 55.8 %. The left  ventricle has normal function. The left ventricle has no regional wall  motion abnormalities. Left ventricular  diastolic parameters are consistent with Grade I diastolic dysfunction  (impaired relaxation). The average left ventricular global longitudinal  strain is -17.3 %. The global longitudinal strain is normal.   2. Right ventricular systolic function is normal. The right ventricular  size is normal.   3. The mitral valve is normal in structure. Trivial mitral valve  regurgitation.   4. The aortic valve is tricuspid. Aortic valve regurgitation is not  visualized. Mild aortic valve stenosis. Aortic valve mean gradient  measures 11.5 mmHg.   Labs:       Component Value Date/Time   NA 140 07/17/2023 0957   NA 139 04/08/2021 1401   K 4.1 07/17/2023 0957   CL 103 07/17/2023 0957   CO2 28 07/17/2023 0957   GLUCOSE 108 (H) 07/17/2023 0957   BUN  16 07/17/2023 0957   BUN 16 04/08/2021 1401   CREATININE 1.02 07/17/2023 0957   CALCIUM 9.8 07/17/2023 0957   PROT 6.8 07/17/2023 0957   ALBUMIN 4.2 08/16/2016 1147   AST 24 07/17/2023 0957   ALT 26 07/17/2023 0957   ALKPHOS 44 08/16/2016 1147   BILITOT 0.7 07/17/2023 0957   GFRNONAA 89 05/17/2021 1143   GFRAA 103 05/17/2021 1143     Lab Results  Component Value Date   WBC 4.4 07/17/2023   HGB 14.9  07/17/2023   HCT 41.9 07/17/2023   MCV 93.7 07/17/2023   PLT 170 07/17/2023    Lab Results  Component Value Date   CHOL 126 07/17/2023   HDL 51 07/17/2023   LDLCALC 54 07/17/2023   TRIG 126 07/17/2023   CHOLHDL 2.5 07/17/2023    Lab Results  Component Value Date   HGBA1C 5.7 (H) 07/17/2023     No results found for: "TSH"   Lab Results  Component Value Date   PSA 1.37 07/17/2023   PSA 1.81 07/11/2022   PSA 1.51 05/17/2021    Assessment & Plan:   Left AC joint inflammation: Followed by Richardean Canal, PA-C with [facility]. Treated with Celebrex. He complains of ongoing arthritic pain in his left shoulder. At this time he states he has elected not to pursue surgical treatment.   Tinnitus:  He complains of bilateral tinnitus that has been more bothersome lately. He is requesting a referral to ENT. We will refer him to Dr. Jenne Campus for further evaluation.  History of nonobstructive coronary disease: Followed by Dr. Eden Emms. Treated with rosuvastatin 5 mg daily. Lipid panel 06/2023 showed total cholesterol 126, HDL 51, triglycerides 126, LDL 54.  Type 2 diabetes mellitus: Stable on semaglutide per Dr. Lonzo Cloud- Hgb A1c 5.7% as of 07/17/2203.  Essential hypertension:  Stable on current medications HCTZ, olmesartan 20 mg daily  Mild depression:  treated with Wellbutrin  Anxiety:  Treated with Xanax which also treats his irritable bowel syndrome  Family history of prostate cancer in father:  PSA 1.37 in 06/2023. Prostate exam normal today. Negative Guaiac result.  History of right hip arthroplasty 2020.   Colonoscopy is up-to-date.    Reviewed his lab work as of 07/17/2023: Hgb A1c 5.7%. Microalbumin/creatinine ratio normal. CBC showed elevated MCH at 33.3, otherwise within normal limits. CMP with glucose 108, BUN 16, creatinine 1.02, sodium 140, potassium 4.1, ALT 26. Lipid panel showed total cholesterol 126, HDL 51, triglycerides 126, LDL 54. PSA  1.37.  Vaccine counseling:  He had tetanus updated in May 2022. Pneumococcal 20 vaccine received 05/01/2023. Discussed receiving influenza vaccine this Fall.  He declines Covid-19 vaccine this Fall.  He will return here in 6 months for follow-up with hemoglobin A1c, lipid panel, and liver functions.      Annual wellness visit done today including the all of the following: Reviewed patient's Family Medical History Reviewed and updated list of patient's medical providers Assessment of cognitive impairment was done Assessed patient's functional ability Established a written schedule for health screening services Health Risk Assessent Completed and Reviewed  Discussed health benefits of physical activity, and encouraged him to engage in regular exercise appropriate for his age and condition.      I,Mathew Stumpf,acting as a Neurosurgeon for Margaree Mackintosh, MD.,have documented all relevant documentation on the behalf of Margaree Mackintosh, MD,as directed by  Margaree Mackintosh, MD while in the presence of Margaree Mackintosh, MD.   Brien Mates  Kyung Rudd, MD, have reviewed all documentation for this visit. The documentation on 07/26/23 for the exam, diagnosis, procedures, and orders are all accurate and complete.

## 2023-07-18 ENCOUNTER — Encounter: Payer: Self-pay | Admitting: Internal Medicine

## 2023-07-18 ENCOUNTER — Ambulatory Visit (INDEPENDENT_AMBULATORY_CARE_PROVIDER_SITE_OTHER): Payer: Medicare Other | Admitting: Internal Medicine

## 2023-07-18 VITALS — BP 124/82 | HR 75 | Temp 98.2°F | Ht 71.0 in | Wt 218.0 lb

## 2023-07-18 DIAGNOSIS — Z8719 Personal history of other diseases of the digestive system: Secondary | ICD-10-CM

## 2023-07-18 DIAGNOSIS — Z8659 Personal history of other mental and behavioral disorders: Secondary | ICD-10-CM

## 2023-07-18 DIAGNOSIS — Z6830 Body mass index (BMI) 30.0-30.9, adult: Secondary | ICD-10-CM

## 2023-07-18 DIAGNOSIS — Z86010 Personal history of colonic polyps: Secondary | ICD-10-CM

## 2023-07-18 DIAGNOSIS — K580 Irritable bowel syndrome with diarrhea: Secondary | ICD-10-CM

## 2023-07-18 DIAGNOSIS — E782 Mixed hyperlipidemia: Secondary | ICD-10-CM

## 2023-07-18 DIAGNOSIS — Z0000 Encounter for general adult medical examination without abnormal findings: Secondary | ICD-10-CM | POA: Diagnosis not present

## 2023-07-18 DIAGNOSIS — I10 Essential (primary) hypertension: Secondary | ICD-10-CM | POA: Diagnosis not present

## 2023-07-18 DIAGNOSIS — E1169 Type 2 diabetes mellitus with other specified complication: Secondary | ICD-10-CM | POA: Diagnosis not present

## 2023-07-18 DIAGNOSIS — Z96641 Presence of right artificial hip joint: Secondary | ICD-10-CM

## 2023-07-18 LAB — HEMOGLOBIN A1C
Hgb A1c MFr Bld: 5.7 % of total Hgb — ABNORMAL HIGH (ref <5.7)
Mean Plasma Glucose: 117 mg/dL
eAG (mmol/L): 6.5 mmol/L

## 2023-07-18 LAB — COMPLETE METABOLIC PANEL WITH GFR
AG Ratio: 2.2 (calc) (ref 1.0–2.5)
ALT: 26 U/L (ref 9–46)
AST: 24 U/L (ref 10–35)
Albumin: 4.7 g/dL (ref 3.6–5.1)
Alkaline phosphatase (APISO): 50 U/L (ref 35–144)
BUN: 16 mg/dL (ref 7–25)
CO2: 28 mmol/L (ref 20–32)
Calcium: 9.8 mg/dL (ref 8.6–10.3)
Chloride: 103 mmol/L (ref 98–110)
Creat: 1.02 mg/dL (ref 0.70–1.35)
Globulin: 2.1 g/dL (calc) (ref 1.9–3.7)
Glucose, Bld: 108 mg/dL — ABNORMAL HIGH (ref 65–99)
Potassium: 4.1 mmol/L (ref 3.5–5.3)
Sodium: 140 mmol/L (ref 135–146)
Total Bilirubin: 0.7 mg/dL (ref 0.2–1.2)
Total Protein: 6.8 g/dL (ref 6.1–8.1)
eGFR: 80 mL/min/{1.73_m2} (ref >=60)

## 2023-07-18 LAB — LIPID PANEL
Cholesterol: 126 mg/dL (ref <200)
HDL: 51 mg/dL (ref >=40)
LDL Cholesterol (Calc): 54 mg/dL (calc)
Non-HDL Cholesterol (Calc): 75 mg/dL (calc) (ref <130)
Total CHOL/HDL Ratio: 2.5 (calc) (ref <5.0)
Triglycerides: 126 mg/dL (ref <150)

## 2023-07-18 LAB — CBC WITH DIFFERENTIAL/PLATELET
Absolute Monocytes: 370 cells/uL (ref 200–950)
Basophils Absolute: 31 cells/uL (ref 0–200)
Hemoglobin: 14.9 g/dL (ref 13.2–17.1)
MCH: 33.3 pg — ABNORMAL HIGH (ref 27.0–33.0)
MCV: 93.7 fL (ref 80.0–100.0)
Monocytes Relative: 8.4 %
Neutro Abs: 2046 cells/uL (ref 1500–7800)
Neutrophils Relative %: 46.5 %
Platelets: 170 10*3/uL (ref 140–400)
RBC: 4.47 10*6/uL (ref 4.20–5.80)
Total Lymphocyte: 42.6 %
WBC: 4.4 10*3/uL (ref 3.8–10.8)

## 2023-07-18 LAB — POC URINALSYSI DIPSTICK (AUTOMATED)
Bilirubin, UA: NEGATIVE
Blood, UA: NEGATIVE
Glucose, UA: NEGATIVE
Ketones, UA: NEGATIVE
Leukocytes, UA: NEGATIVE
Nitrite, UA: NEGATIVE
Protein, UA: NEGATIVE
Spec Grav, UA: 1.015 (ref 1.010–1.025)
Urobilinogen, UA: 0.2 E.U./dL
pH, UA: 6.5 (ref 5.0–8.0)

## 2023-07-18 LAB — MICROALBUMIN / CREATININE URINE RATIO
Creatinine, Urine: 266 mg/dL (ref 20–320)
Microalb Creat Ratio: 3 mg/g creat (ref <30)
Microalb, Ur: 0.8 mg/dL

## 2023-07-18 LAB — PSA: PSA: 1.37 ng/mL (ref <=4.00)

## 2023-07-26 NOTE — Patient Instructions (Addendum)
Patient requesting referral for tinnitus.  Dr. Jenne Campus in [place] has been suggested to him.  Dr. Eden Emms is following his nonobstructive coronary disease.  Hemoglobin A1c excellent at 5.7% on Januvia.  Blood pressure is stable.  Anxiety is stable.  Shoulder pain is treated with Celebrex.  It was a pleasure to see you today.  Follow-up in January 2025.

## 2023-07-31 ENCOUNTER — Other Ambulatory Visit: Payer: Self-pay | Admitting: Internal Medicine

## 2023-07-31 ENCOUNTER — Telehealth: Payer: Self-pay | Admitting: Internal Medicine

## 2023-07-31 ENCOUNTER — Encounter: Payer: Self-pay | Admitting: Internal Medicine

## 2023-07-31 ENCOUNTER — Telehealth: Payer: Medicare Other | Admitting: Internal Medicine

## 2023-07-31 DIAGNOSIS — U071 COVID-19: Secondary | ICD-10-CM | POA: Diagnosis not present

## 2023-07-31 DIAGNOSIS — I10 Essential (primary) hypertension: Secondary | ICD-10-CM | POA: Diagnosis not present

## 2023-07-31 DIAGNOSIS — E1169 Type 2 diabetes mellitus with other specified complication: Secondary | ICD-10-CM

## 2023-07-31 DIAGNOSIS — E119 Type 2 diabetes mellitus without complications: Secondary | ICD-10-CM

## 2023-07-31 MED ORDER — NIRMATRELVIR/RITONAVIR (PAXLOVID)TABLET: 3.0000 | ORAL_TABLET | Freq: Two times a day (BID) | ORAL | 0 refills | Status: AC

## 2023-07-31 NOTE — Telephone Encounter (Signed)
[name] [name] [phone]  [name] called to say he has tested positive for Covid this morning he is having head congestion, cough possible fever, runny nose. He has some Paxlovid but he wants to know if it is alright to take. I let him know we would set him up for video visit for you to discuss, he said he was driving.

## 2023-07-31 NOTE — Patient Instructions (Signed)
Prescribed regular strength Paxlovid and take as directed.  Stay well-hydrated.  Walk some to prevent atelectasis of the lungs.  Call if you develop acute shortness of breath.  May want to monitor pulse oximetry.  Suggest quarantining for 5 days.

## 2023-07-31 NOTE — Progress Notes (Signed)
Patient Care Team: Margaree Mackintosh, MD as PCP - General (Internal Medicine) Wendall Stade, MD as PCP - Cardiology (Cardiology)  I connected with [name] [name] on 07/31/23 at 11:17 AM by video enabled telemedicine visit and verified that I am speaking with the correct person using two identifiers.   I discussed the limitations, risks, security and privacy concerns of performing an evaluation and management service by telemedicine and the availability of in-person appointments. I also discussed with the patient that there may be a patient responsible charge related to this service. The patient expressed understanding and agreed to proceed.   Other persons participating in the visit and their role in the encounter: Medical scribe, Doylene Bode  Patient's location: Home  Provider's location: Clinic   I provided 10 minutes of face-to-face video visit time during this encounter, and > 50% was spent counseling as documented under my assessment & plan. He is identified using two identifiers, [name] [name], a patient of this practice. He is in his home and I am in my practice. He is agreeable to using this format today.  Chief Complaint: cough, congestion   Subjective:    Patient ID: [name] [name] , Male    DOB: 11-08-1956, 67 y.o.    MRN: [id number]   67 y.o. Male presents today for cough and congestion. Reports he had a positive at-home Covid-19 test today. He is supposed to go to [place] on 08/04/23.  Past Medical History:  Diagnosis Date   Anxiety    mild- past  hx   Arthritis    Rt hip   Cancer (HCC) 04/2019   Skin Rt hand. Dr. Yetta Barre   Diabetes mellitus without complication (HCC)    GERD (gastroesophageal reflux disease)    Tums or omeprozole   Heart murmur    History of IBS    History of kidney stones    30 years ago   Hypertension 2009   Meds controlled     Family History  Problem Relation Age of Onset   Breast cancer Mother    Prostate cancer Father    Cancer Father     Colon cancer Neg Hx    Colon polyps Neg Hx    Esophageal cancer Neg Hx    Stomach cancer Neg Hx    Rectal cancer Neg Hx     Social History   Social History Narrative   Not on file      Review of Systems  Constitutional:  Negative for fever and malaise/fatigue.  HENT:  Positive for congestion.   Eyes:  Negative for blurred vision.  Respiratory:  Positive for cough. Negative for shortness of breath.   Cardiovascular:  Negative for chest pain, palpitations and leg swelling.  Gastrointestinal:  Negative for vomiting.  Musculoskeletal:  Negative for back pain.  Skin:  Negative for rash.  Neurological:  Negative for loss of consciousness and headaches.        Objective:   Vitals: There were no vitals taken for this visit.   Physical Exam Vitals and nursing note reviewed.  Constitutional:      General: He is not in acute distress.    Appearance: Normal appearance. He is not ill-appearing.  HENT:     Head: Normocephalic and atraumatic.  Pulmonary:     Effort: Pulmonary effort is normal.  Skin:    General: Skin is warm and dry.  Neurological:     Mental Status: He is alert and oriented to person, place,  and time. Mental status is at baseline.  Psychiatric:        Mood and Affect: Mood normal.        Behavior: Behavior normal.        Thought Content: Thought content normal.        Judgment: Judgment normal.       Results:   Studies obtained and personally reviewed by me:   Labs:       Component Value Date/Time   NA 140 07/17/2023 0957   NA 139 04/08/2021 1401   K 4.1 07/17/2023 0957   CL 103 07/17/2023 0957   CO2 28 07/17/2023 0957   GLUCOSE 108 (H) 07/17/2023 0957   BUN 16 07/17/2023 0957   BUN 16 04/08/2021 1401   CREATININE 1.02 07/17/2023 0957   CALCIUM 9.8 07/17/2023 0957   PROT 6.8 07/17/2023 0957   ALBUMIN 4.2 08/16/2016 1147   AST 24 07/17/2023 0957   ALT 26 07/17/2023 0957   ALKPHOS 44 08/16/2016 1147   BILITOT 0.7 07/17/2023 0957    GFRNONAA 89 05/17/2021 1143   GFRAA 103 05/17/2021 1143     Lab Results  Component Value Date   WBC 4.4 07/17/2023   HGB 14.9 07/17/2023   HCT 41.9 07/17/2023   MCV 93.7 07/17/2023   PLT 170 07/17/2023    Lab Results  Component Value Date   CHOL 126 07/17/2023   HDL 51 07/17/2023   LDLCALC 54 07/17/2023   TRIG 126 07/17/2023   CHOLHDL 2.5 07/17/2023    Lab Results  Component Value Date   HGBA1C 5.7 (H) 07/17/2023     No results found for: "TSH"   Lab Results  Component Value Date   PSA 1.37 07/17/2023   PSA 1.81 07/11/2022   PSA 1.51 05/17/2021      Assessment & Plan:   Acute Covid-19 infection: GFR at 80. Prescribed Paxlovid regular strength.Has normal GFR. Stay well hydrated and walk some to prevent atelectasis. May take Tylenol for fever.  Contact us if symptoms worsen or do not improve within a few days.Fatigue can persist for a couple of weeks.  Type 2 Diabetes stable at present time- just had recent OV  Essential Hypertension stable on current regimen  I,Alexander Ruley,acting as a scribe for Margaree Mackintosh, MD.,have documented all relevant documentation on the behalf of Margaree Mackintosh, MD,as directed by  Margaree Mackintosh, MD while in the presence of Margaree Mackintosh, MD.   I, Margaree Mackintosh, MD, have reviewed all documentation for this visit. The documentation on 07/31/23 for the exam, diagnosis, procedures, and orders are all accurate and complete.

## 2023-07-31 NOTE — Telephone Encounter (Signed)
scheduled

## 2023-08-01 ENCOUNTER — Ambulatory Visit: Payer: Medicare Other | Admitting: Physician Assistant

## 2023-08-14 ENCOUNTER — Ambulatory Visit (INDEPENDENT_AMBULATORY_CARE_PROVIDER_SITE_OTHER): Payer: Medicare Other | Admitting: Physician Assistant

## 2023-08-14 ENCOUNTER — Encounter: Payer: Self-pay | Admitting: Physician Assistant

## 2023-08-14 ENCOUNTER — Other Ambulatory Visit (INDEPENDENT_AMBULATORY_CARE_PROVIDER_SITE_OTHER): Payer: Medicare Other

## 2023-08-14 DIAGNOSIS — M542 Cervicalgia: Secondary | ICD-10-CM

## 2023-08-14 DIAGNOSIS — M5412 Radiculopathy, cervical region: Secondary | ICD-10-CM

## 2023-08-14 NOTE — Progress Notes (Signed)
Office Visit Note   Patient: [name] [name]           Date of Birth: 02-07-1956           MRN: [id number] Visit Date: 08/14/2023              Requested by: [name] Mackintosh, MD [address] [place],  [place] [id number] PCP: [name] Mackintosh, MD   Assessment & Plan: Visit Diagnoses:  1. Neck pain   2. Radiculopathy, cervical region     Plan:  Will send him for formal therapy for his neck.  This is to decrease his pain and increased neck strength range of motion.  They will include home exercise program modalities.  We gave him a prescription for steroids in [place].  He may have went here in the past and is unsure if it wants to stay in [place] for his therapy.  Also have him begin taking Neurontin which she has at home and like for him to start at 100 mg and then go up weekly until he gets to 300 mg nightly.  He will follow-up with [place] pain persist or becomes worse.  Discussed with him reasons that we would obtain an MRI of his neck to rule out HNP as a source of his radicular symptoms.  Questions were encouraged and answered at length.  Follow-Up Instructions: Return if symptoms worsen or fail to improve.   Orders:  Orders Placed This Encounter  Procedures   XR Cervical Spine 2 or 3 views   No orders of the defined types were placed in this encounter.     Procedures: No procedures performed   Clinical Data: No additional findings.   Subjective: Chief Complaint  Patient presents with   Left Shoulder - Pain    HPI [name] [name] comes in today stating he is having left shoulder pain.  He had a cortisone injection in his left shoulder AC joint 03/16/2022 and states that it helped but did not last for really long time.  He is now having pain in the trapezius region left shoulder.  He states if he brings his arm above his head he has diminished pain.  He does note some numbness tingling down into the deltoid region on the left only.  He has had no injury.  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  He is diabetic that is under good control with a hemoglobin A1c of 5.7.  He does note that he recently had COVID and has still some upper respiratory symptoms.  Review of Systems   Objective: Vital Signs: There were no vitals taken for this visit.  Physical Exam Constitutional:      Appearance: He is not ill-appearing or diaphoretic.  Cardiovascular:     Pulses: Normal pulses.  Pulmonary:     Effort: Pulmonary effort is normal.  Neurological:     Mental Status: He is alert and oriented to person, place, and time.  Psychiatric:        Mood and Affect: Mood normal.     Ortho Exam Cervical spine good range of motion cervical spinal.  He has discomfort with rotation left and right.  Positive Spurling's.  Tenderness medial border left scapula only. Upper extremity: Strength 5 out of 5 strength throughout.  Full motor bilateral hands full sensation bilateral hands.  Radial pulses are 2+ and equal symmetric.  He has slight discomfort with abduction of the left arm across chest.  Minimal tenderness over  the [facility] joint.  Tenderness in the left trapezius region and medial border of the left scapula. Specialty Comments:  No specialty comments available.  Imaging: XR Cervical Spine 2 or 3 views  Result Date: 08/14/2023 Cervical spine 2 views: No acute fracture displacement well-maintained no spinal listhesis.  Anterior endplate spurring V7-Q4, C5-C6 and C6-C7.  Normal lordotic curvature.    PMFS History: Patient Active Problem List   Diagnosis Date Noted   DM (diabetes mellitus) (HCC) 07/08/2022   Metatarsalgia of right foot 02/01/2022   CAD (coronary artery disease), native coronary artery 04/12/2021   Status post total replacement of right hip 05/10/2019   Unilateral primary osteoarthritis, right hip 01/24/2019   GERD (gastroesophageal reflux disease) 09/16/2013   HTN (hypertension) 06/07/2011   Irritable bowel 06/07/2011    DIVERTICULITIS-COLON 11/27/2008   Past Medical History:  Diagnosis Date   Anxiety    mild- past  hx   Arthritis    Rt hip   Cancer (HCC) 04/2019   Skin Rt hand. [name] [name]   Diabetes mellitus without complication (HCC)    GERD (gastroesophageal reflux disease)    Tums or omeprozole   Heart murmur    History of IBS    History of kidney stones    30 years ago   Hypertension 2009   Meds controlled    Family History  Problem Relation Age of Onset   Breast cancer Mother    Prostate cancer Father    Cancer Father    Colon cancer Neg Hx    Colon polyps Neg Hx    Esophageal cancer Neg Hx    Stomach cancer Neg Hx    Rectal cancer Neg Hx     Past Surgical History:  Procedure Laterality Date   COLONOSCOPY  2005   has IBS   LEFT HEART CATH AND CORONARY ANGIOGRAPHY N/A 04/12/2021   Procedure: LEFT HEART CATH AND CORONARY ANGIOGRAPHY;  Surgeon: Lyn Records, MD;  [facility]: [facility];  Service: Cardiovascular;  Laterality: N/A;   renal stone     TOTAL HIP ARTHROPLASTY Right 05/10/2019   Procedure: RIGHT TOTAL HIP ARTHROPLASTY ANTERIOR APPROACH;  Surgeon: Kathryne Hitch, MD;  Location: [facility];  Service: Orthopedics;  Laterality: Right;   Social History   Occupational History   Not on file  Tobacco Use   Smoking status: Never    Passive exposure: Never   Smokeless tobacco: Never  Vaping Use   Vaping status: Never Used  Substance and Sexual Activity   Alcohol use: Yes    Alcohol/week: 2.0 - 3.0 standard drinks of alcohol    Types: 2 - 3 Shots of liquor per week    Comment: occ   Drug use: No   Sexual activity: Yes

## 2023-08-17 ENCOUNTER — Telehealth: Payer: Self-pay | Admitting: Internal Medicine

## 2023-08-17 ENCOUNTER — Ambulatory Visit (INDEPENDENT_AMBULATORY_CARE_PROVIDER_SITE_OTHER): Payer: Medicare Other | Admitting: Internal Medicine

## 2023-08-17 ENCOUNTER — Encounter: Payer: Self-pay | Admitting: Internal Medicine

## 2023-08-17 VITALS — BP 110/70 | HR 78 | Temp 98.0°F | Ht 71.0 in | Wt 216.0 lb

## 2023-08-17 DIAGNOSIS — Z23 Encounter for immunization: Secondary | ICD-10-CM | POA: Diagnosis not present

## 2023-08-17 DIAGNOSIS — N410 Acute prostatitis: Secondary | ICD-10-CM

## 2023-08-17 LAB — POCT URINALYSIS DIP (CLINITEK)
Bilirubin, UA: NEGATIVE
Blood, UA: NEGATIVE
Glucose, UA: NEGATIVE mg/dL
Leukocytes, UA: NEGATIVE
Nitrite, UA: NEGATIVE
POC PROTEIN,UA: NEGATIVE
Spec Grav, UA: <=1.005 — AB (ref 1.010–1.025)
Urobilinogen, UA: 0.2 E.U./dL
pH, UA: 6 (ref 5.0–8.0)

## 2023-08-17 MED ORDER — CIPROFLOXACIN HCL 500 MG PO TABS: 500.0000 mg | ORAL_TABLET | Freq: Two times a day (BID) | ORAL | 0 refills | Status: AC

## 2023-08-17 NOTE — Telephone Encounter (Signed)
scheduled

## 2023-08-17 NOTE — Progress Notes (Signed)
Patient Care Team: Margaree Mackintosh, MD as PCP - General (Internal Medicine) Wendall Stade, MD as PCP - Cardiology (Cardiology)  Visit Date: 08/17/23  Subjective:    Patient ID: [name] [name] , Male   DOB: 08-05-1956, 67 y.o.    MRN: [id number]   67 y.o. Male presents today for LLQ abdominal discomfort. Describes the feeling as a pressure on his bladder. He is having urinary frequency that is normal for his age. History of diverticulitis. He is having normal BM's. Last BM was today. Denies fever, chills, nausea, vomiting.  Past Medical History:  Diagnosis Date   Anxiety    mild- past  hx   Arthritis    Rt hip   Cancer (HCC) 04/2019   Skin Rt hand. Dr. Yetta Barre   Diabetes mellitus without complication (HCC)    GERD (gastroesophageal reflux disease)    Tums or omeprozole   Heart murmur    History of IBS    History of kidney stones    30 years ago   Hypertension 2009   Meds controlled     Family History  Problem Relation Age of Onset   Breast cancer Mother    Prostate cancer Father    Cancer Father    Colon cancer Neg Hx    Colon polyps Neg Hx    Esophageal cancer Neg Hx    Stomach cancer Neg Hx    Rectal cancer Neg Hx          Review of Systems  Constitutional:  Negative for chills, fever and malaise/fatigue.  HENT:  Negative for congestion.   Eyes:  Negative for blurred vision.  Respiratory:  Negative for cough and shortness of breath.   Cardiovascular:  Negative for chest pain, palpitations and leg swelling.  Gastrointestinal:  Positive for abdominal pain. Negative for nausea and vomiting.  Musculoskeletal:  Negative for back pain.  Skin:  Negative for rash.  Neurological:  Negative for loss of consciousness and headaches.        Objective:   Vitals: BP 110/70   Pulse 78   Ht 5\' 11"  (1.803 m)   Wt 216 lb (98 kg)   SpO2 96%   BMI 30.13 kg/m    Physical Exam Vitals and nursing note reviewed.  Constitutional:      General: He is not in acute  distress.    Appearance: Normal appearance. He is not ill-appearing.  HENT:     Head: Normocephalic and atraumatic.  Pulmonary:     Effort: Pulmonary effort is normal.  Abdominal:     General: There is no distension.     Palpations: Abdomen is soft. There is no hepatomegaly, splenomegaly or mass.     Tenderness: There is abdominal tenderness (Mild, over bladder).  Genitourinary:    Prostate: Enlarged (Slightly). No nodules present.     Comments: Prostate slightly boggy throughout. No stool to guaiac. Skin:    General: Skin is warm and dry.  Neurological:     Mental Status: He is alert and oriented to person, place, and time. Mental status is at baseline.  Psychiatric:        Mood and Affect: Mood normal.        Behavior: Behavior normal.        Thought Content: Thought content normal.        Judgment: Judgment normal.       Results:   Studies obtained and personally reviewed by me:   Labs:  Component Value Date/Time   NA 140 07/17/2023 0957   NA 139 04/08/2021 1401   K 4.1 07/17/2023 0957   CL 103 07/17/2023 0957   CO2 28 07/17/2023 0957   GLUCOSE 108 (H) 07/17/2023 0957   BUN 16 07/17/2023 0957   BUN 16 04/08/2021 1401   CREATININE 1.02 07/17/2023 0957   CALCIUM 9.8 07/17/2023 0957   PROT 6.8 07/17/2023 0957   ALBUMIN 4.2 08/16/2016 1147   AST 24 07/17/2023 0957   ALT 26 07/17/2023 0957   ALKPHOS 44 08/16/2016 1147   BILITOT 0.7 07/17/2023 0957   GFRNONAA 89 05/17/2021 1143   GFRAA 103 05/17/2021 1143     Lab Results  Component Value Date   WBC 4.4 07/17/2023   HGB 14.9 07/17/2023   HCT 41.9 07/17/2023   MCV 93.7 07/17/2023   PLT 170 07/17/2023    Lab Results  Component Value Date   CHOL 126 07/17/2023   HDL 51 07/17/2023   LDLCALC 54 07/17/2023   TRIG 126 07/17/2023   CHOLHDL 2.5 07/17/2023    Lab Results  Component Value Date   HGBA1C 5.7 (H) 07/17/2023     No results found for: "TSH"   Lab Results  Component Value Date   PSA  1.37 07/17/2023   PSA 1.81 07/11/2022   PSA 1.51 05/17/2021      Assessment & Plan:   Prostatitis: urinalysis showed trace ketones, specific gravity <=1.005. Prescribed ciprofloxacin 500 mg twice daily for ten days. Contact us if symptoms worsen or fail to improve.     I,Alexander Ruley,acting as a Neurosurgeon for Margaree Mackintosh, MD.,have documented all relevant documentation on the behalf of Margaree Mackintosh, MD,as directed by  Margaree Mackintosh, MD while in the presence of Margaree Mackintosh, MD.   I, Margaree Mackintosh, MD, have reviewed all documentation for this visit. The documentation on 08/27/23 for the exam, diagnosis, procedures, and orders are all accurate and complete.

## 2023-08-17 NOTE — Telephone Encounter (Signed)
[name] [name] [phone]  [name] called to say he has been having some abdominal pain for the last 3 days, each day it seems to get a little worse.  It does not hurt all the time, he does not think it is his diverticulitis. It hurts some times when he walks, leans or sits a certain way. It does seem to be a little worse today.

## 2023-08-27 NOTE — Patient Instructions (Signed)
You had acute pain in your mid lower abdomen.  Prostate feels a bit boggy.  I believe he may have early prostatitis.  Urine specimen is unremarkable except urine is quite dilute.  Take Cipro 500 mg twice daily for 10 days.  Call if symptoms worsen or not improving within 48 hours.  Eat lightly.

## 2023-10-26 ENCOUNTER — Other Ambulatory Visit: Payer: Self-pay | Admitting: Internal Medicine

## 2023-11-04 ENCOUNTER — Other Ambulatory Visit: Payer: Self-pay | Admitting: Internal Medicine

## 2023-11-07 NOTE — Progress Notes (Signed)
CARDIOLOGY CONSULT NOTE     Patient ID: [name] [name] MRN: [id number] DOB/AGE: 69/05/1956 69 y.o.  Primary Physician: Margaree Mackintosh, MD Primary Cardiologist: Eden Emms Reason for Consultation: CAD Risk    HPI:  67 y.o. referred by Dr Lenord Fellers 01/05/21 to assess CAD risk. 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 LVEDP elevated may explain some dyspnea  He is active with more exertional dyspnea. Right foot and achilles has limited him and weight is up Also post right  THR. 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 Dr. Yetta Barre   Diabetes mellitus without complication (HCC)    GERD (gastroesophageal reflux disease)    Tums or omeprozole   Heart murmur    History of IBS     History of kidney stones    30 years ago   Hypertension 2009   Meds controlled    Family History  Problem Relation Age of Onset   Breast cancer Mother    Prostate cancer Father    Cancer Father    Colon cancer Neg Hx    Colon polyps Neg Hx    Esophageal cancer Neg Hx    Stomach cancer Neg Hx    Rectal cancer Neg Hx     Social History   Socioeconomic History   Marital status: Married    Spouse name: Not on file   Number of children: Not on file   Years of education: Not on file   Highest education level: Not on file  Occupational History   Not on file  Tobacco Use   Smoking status: Never    Passive exposure: Never   Smokeless tobacco: Never  Vaping Use   Vaping status: Never Used  Substance and Sexual Activity   Alcohol use: Yes    Alcohol/week: 2.0 - 3.0 standard drinks of alcohol    Types: 2 - 3 Shots of liquor per week    Comment: occ   Drug use: No   Sexual activity: Yes  Other Topics Concern   Not  on file  Social History Narrative   Not on file   Social Determinants of Health   Financial Resource Strain: Patient Declined (05/01/2023)   Overall Financial Resource Strain (CARDIA)    Difficulty of Paying Living Expenses: Patient declined  Food Insecurity: Patient Declined (05/01/2023)   Hunger Vital Sign    Worried About Running Out of Food in the Last Year: Patient declined    Ran Out of Food in the Last Year: Patient declined  Transportation Needs: Unknown (05/01/2023)   PRAPARE - Administrator, Civil Service (Medical): Patient declined    Lack of Transportation (Non-Medical): Not on file  Physical Activity: Unknown (05/01/2023)   Exercise Vital Sign    Days of Exercise per Week: 5 days    Minutes of Exercise per Session: Patient declined  Stress: Patient Declined (05/01/2023)   [employer] of Occupational Health - Occupational Stress Questionnaire    Feeling of Stress : Patient declined  Social Connections: Unknown (05/01/2023)   Social  Connection and Isolation Panel [NHANES]    Frequency of Communication with Friends and Family: Patient declined    Frequency of Social Gatherings with Friends and Family: Patient declined    Attends Religious Services: Patient declined    Active Member of Clubs or Organizations: Patient declined    Attends Banker Meetings: Not on file    Marital Status: Patient declined  Intimate Partner Violence: Not on file    Past Surgical History:  Procedure Laterality Date   COLONOSCOPY  2005   has IBS   LEFT HEART CATH AND CORONARY ANGIOGRAPHY N/A 04/12/2021   Procedure: LEFT HEART CATH AND CORONARY ANGIOGRAPHY;  Surgeon: Lyn Records, MD;  Location: [facility];  Service: Cardiovascular;  Laterality: N/A;   renal stone     TOTAL HIP ARTHROPLASTY Right 05/10/2019   Procedure: RIGHT TOTAL HIP ARTHROPLASTY ANTERIOR APPROACH;  Surgeon: Kathryne Hitch, MD;  Location: [facility];  Service: Orthopedics;  Laterality: Right;      Current Outpatient Medications:    Accu-Chek FastClix Lancets MISC, USE TO TEST BLOOD GLUCOSE BEFORE BREAKFAST AND SUPPER, Disp: 102 each, Rfl: prn   ACCU-CHEK GUIDE test strip, USE TO TEST BLOOD GLUCOSE BEFORE BREAKFAST AND SUPPER, Disp: 100 strip, Rfl: PRN   ALPRAZolam (XANAX) 0.5 MG tablet, TAKE ONE TABLET BY MOUTH THREE TIMES A DAY, Disp: 90 tablet, Rfl: 2   aspirin EC 81 MG tablet, Take 81 mg by mouth daily. Swallow whole., Disp: , Rfl:    buPROPion (WELLBUTRIN XL) 150 MG 24 hr tablet, TAKE ONE TABLET BY MOUTH ONE TIME DAILY, Disp: 90 tablet, Rfl: 1   celecoxib (CELEBREX) 200 MG capsule, TAKE ONE CAPSULE BY MOUTH TWICE A DAY, Disp: 60 capsule, Rfl: 3   hydrochlorothiazide (HYDRODIURIL) 25 MG tablet, TAKE ONE TABLET BY MOUTH ONE TIME DAILY WITH LOSARTAN, Disp: 90 tablet, Rfl: 3   hydrocortisone (PROCTOZONE-HC) 2.5 % rectal cream, Place 1 Application rectally 2 (two) times daily., Disp: 30 g, Rfl: 0   olmesartan (BENICAR) 20 MG tablet, TAKE ONE  TABLET BY MOUTH ONE TIME DAILY, Disp: 90 tablet, Rfl: 1   pantoprazole (PROTONIX) 40 MG tablet, Take 1 tablet (40 mg total) by mouth daily., Disp: 90 tablet, Rfl: 3   promethazine (PHENERGAN) 25 MG tablet, Take 1 tablet (25 mg total) by mouth every 8 (eight) hours as needed for nausea or vomiting., Disp: 30 tablet, Rfl: 0   rosuvastatin (CRESTOR) 5 MG tablet, TAKE ONE TABLET BY  MOUTH ONE TIME DAILY, Disp: 90 tablet, Rfl: 3   Semaglutide, 1 MG/DOSE, 4 MG/3ML SOPN, Inject 1 mg as directed once a week., Disp: 9 mL, Rfl: 3   sildenafil (VIAGRA) 100 MG tablet, TAKE ONE-HALF TO ONE TABLET BY MOUTH ONE TIME DAILY AS NEEDED ERECTILE DYSFUNCTION., Disp: 5 tablet, Rfl: 11   tadalafil (CIALIS) 20 MG tablet, TAKE ONE-HALF TO ONE TABLET BY MOUTH EVERY OTHER DAY AS NEEDED FOR ERECTILE DYSFUNCTION, Disp: 10 tablet, Rfl: 11    Physical Exam: Blood pressure 132/78, pulse 74, height 5\' 11"  (1.803 m), weight 215 lb 9.6 oz (97.8 kg), SpO2 97%.   Affect appropriate Healthy:  appears stated age HEENT: normal Neck supple with no adenopathy JVP normal no bruits no thyromegaly Lungs clear with no wheezing and good diaphragmatic motion Heart:  S1/S2 AS  murmur, no rub, gallop or click PMI normal Abdomen: benighn, BS positve, no tenderness, no AAA no bruit.  No HSM or HJR Distal pulses intact with no bruits No edema Neuro non-focal Skin warm and dry No muscular weakness   Labs:   Lab Results  Component Value Date   WBC 4.4 07/17/2023   HGB 14.9 07/17/2023   HCT 41.9 07/17/2023   MCV 93.7 07/17/2023   PLT 170 07/17/2023    No results for input(s): "NA", "K", "CL", "CO2", "BUN", "CREATININE", "CALCIUM", "PROT", "BILITOT", "ALKPHOS", "ALT", "AST", "GLUCOSE" in the last 168 hours.  Invalid input(s): "LABALBU"  No results found for: "CKTOTAL", "CKMB", "CKMBINDEX", "TROPONINI"  Lab Results  Component Value Date   CHOL 126 07/17/2023   CHOL 138 01/16/2023   CHOL 121 07/11/2022   Lab Results   Component Value Date   HDL 51 07/17/2023   HDL 54 01/16/2023   HDL 46 07/11/2022   Lab Results  Component Value Date   LDLCALC 54 07/17/2023   LDLCALC 58 01/16/2023   LDLCALC 55 07/11/2022   Lab Results  Component Value Date   TRIG 126 07/17/2023   TRIG 193 (H) 01/16/2023   TRIG 122 07/11/2022   Lab Results  Component Value Date   CHOLHDL 2.5 07/17/2023   CHOLHDL 2.6 01/16/2023   CHOLHDL 2.6 07/11/2022   No results found for: "LDLDIRECT"    Radiology: No results found.   EKG: 05/15/20 NSR non specific ST changes    ASSESSMENT AND PLAN:   1. CAD :  seems that FFR false positive Calcium score elevated but no obstructive CAD by cath 04/12/21  continue medical Rx and risk factor modification  2. DM:: continue Januvia , Glipizide d/c A1c 5.9  3. HLD:  F/u labs Dr Lenord Fellers on statin now  LDL 64  4. Ortho:  chronic left shoulder and cervical radiculopathy PT/steroid injections  5. Anxiety PRN xanax 6. AS:  Mild by TTE 02/01/23 mean gradient 11.5 peak 20.5 DVI 0.65     F/U  in a year   Signed: Charlton Haws 11/20/2023, 8:49 AM

## 2023-11-20 ENCOUNTER — Encounter: Payer: Self-pay | Admitting: Cardiovascular Disease

## 2023-11-20 ENCOUNTER — Ambulatory Visit: Payer: Medicare Other | Attending: Cardiovascular Disease | Admitting: Cardiovascular Disease

## 2023-11-20 VITALS — BP 132/78 | HR 74 | Ht 71.0 in | Wt 215.6 lb

## 2023-11-20 DIAGNOSIS — I2510 Atherosclerotic heart disease of native coronary artery without angina pectoris: Secondary | ICD-10-CM | POA: Insufficient documentation

## 2023-11-20 DIAGNOSIS — I350 Nonrheumatic aortic (valve) stenosis: Secondary | ICD-10-CM | POA: Diagnosis present

## 2023-11-20 NOTE — Patient Instructions (Signed)
Medication Instructions:  Your physician recommends that you continue on your current medications as directed. Please refer to the Current Medication list given to you today.  *If you need a refill on your cardiac medications before your next appointment, please call your pharmacy*   Lab Work: NONE If you have labs (blood work) drawn today and your tests are completely normal, you will receive your results only by: MyChart Message (if you have MyChart) OR A paper copy in the mail If you have any lab test that is abnormal or we need to change your treatment, we will call you to review the results.   Testing/Procedures: NONE   Follow-Up: At [facility], you and your health needs are our priority.  As part of our continuing mission to provide you with exceptional heart care, we have created designated Provider Care Teams.  These Care Teams include your primary Cardiologist (physician) and Advanced Practice Providers (APPs -  Physician Assistants and Nurse Practitioners) who all work together to provide you with the care you need, when you need it.   Your next appointment:   1 year(s)  Provider:   Charlton Haws, MD

## 2023-12-03 ENCOUNTER — Other Ambulatory Visit: Payer: Self-pay | Admitting: Internal Medicine

## 2023-12-19 ENCOUNTER — Other Ambulatory Visit: Payer: Self-pay | Admitting: Internal Medicine

## 2024-01-16 ENCOUNTER — Other Ambulatory Visit: Payer: Self-pay | Admitting: Internal Medicine

## 2024-01-22 ENCOUNTER — Other Ambulatory Visit: Payer: Medicare Other

## 2024-01-22 DIAGNOSIS — I10 Essential (primary) hypertension: Secondary | ICD-10-CM

## 2024-01-22 DIAGNOSIS — E1169 Type 2 diabetes mellitus with other specified complication: Secondary | ICD-10-CM

## 2024-01-22 DIAGNOSIS — E785 Hyperlipidemia, unspecified: Secondary | ICD-10-CM

## 2024-01-22 DIAGNOSIS — E119 Type 2 diabetes mellitus without complications: Secondary | ICD-10-CM

## 2024-01-23 ENCOUNTER — Ambulatory Visit (INDEPENDENT_AMBULATORY_CARE_PROVIDER_SITE_OTHER): Payer: Medicare Other | Admitting: Internal Medicine

## 2024-01-23 ENCOUNTER — Encounter: Payer: Self-pay | Admitting: Internal Medicine

## 2024-01-23 VITALS — BP 130/84 | HR 73 | Ht 71.0 in | Wt 214.0 lb

## 2024-01-23 DIAGNOSIS — Z23 Encounter for immunization: Secondary | ICD-10-CM | POA: Diagnosis not present

## 2024-01-23 DIAGNOSIS — E119 Type 2 diabetes mellitus without complications: Secondary | ICD-10-CM | POA: Diagnosis not present

## 2024-01-23 DIAGNOSIS — E1169 Type 2 diabetes mellitus with other specified complication: Secondary | ICD-10-CM

## 2024-01-23 DIAGNOSIS — Z8659 Personal history of other mental and behavioral disorders: Secondary | ICD-10-CM

## 2024-01-23 DIAGNOSIS — E782 Mixed hyperlipidemia: Secondary | ICD-10-CM

## 2024-01-23 DIAGNOSIS — I10 Essential (primary) hypertension: Secondary | ICD-10-CM

## 2024-01-23 LAB — HEPATIC FUNCTION PANEL
AG Ratio: 2 (calc) (ref 1.0–2.5)
ALT: 27 U/L (ref 9–46)
AST: 22 U/L (ref 10–35)
Albumin: 4.8 g/dL (ref 3.6–5.1)
Alkaline phosphatase (APISO): 58 U/L (ref 35–144)
Bilirubin, Direct: 0.1 mg/dL (ref 0.0–0.2)
Globulin: 2.4 g/dL (ref 1.9–3.7)
Indirect Bilirubin: 0.4 mg/dL (ref 0.2–1.2)
Total Bilirubin: 0.5 mg/dL (ref 0.2–1.2)
Total Protein: 7.2 g/dL (ref 6.1–8.1)

## 2024-01-23 LAB — LIPID PANEL
Cholesterol: 138 mg/dL (ref <200)
HDL: 59 mg/dL (ref >=40)
LDL Cholesterol (Calc): 58 mg/dL
Non-HDL Cholesterol (Calc): 79 mg/dL (ref <130)
Total CHOL/HDL Ratio: 2.3 (calc) (ref <5.0)
Triglycerides: 129 mg/dL (ref <150)

## 2024-01-23 LAB — HEMOGLOBIN A1C
Hgb A1c MFr Bld: 5.7 %{Hb} — ABNORMAL HIGH (ref <5.7)
Mean Plasma Glucose: 117 mg/dL
eAG (mmol/L): 6.5 mmol/L

## 2024-01-23 NOTE — Progress Notes (Signed)
Patient Care Team: Margaree Mackintosh, MD as PCP - General (Internal Medicine) Wendall Stade, MD as PCP - Cardiology (Cardiology)  Visit Date: 01/23/24  Subjective:   Chief Complaint  Patient presents with   Hyperlipidemia   Hypertension   Patient RU:EAVWU G Macdonnell,Male DOB:09/07/1956,68 y.o. JWJ:[id number]   68 y.o. Male presents today for 6 months follow-up for HTN, HLD, & DM.   History of Hypertension treated with 25 mg hydrochlorothiazide daily and 20 mg Benicar. Blood Pressure: hypertensive at 150/100. On recheck 130/84.   History of Hyperlipidemia treated with 5 mg Crestor daily. 01/22/24 Lipid Panel WNL.   History of Diabetes Mellitus treated with 1 mg Semaglutide injected weekly. 01/22/24 HgbA1c 5.7, same as 07/17/23. Had eye exam last year - we do not have records of this, will contact for them  Reviewed 01/22/24 Hepatic Function Panel: WNL  Vaccine Counseling: Flu - agreeable to updating this today & Shingles - discussed, agreeable to receiving this on his own time.  Past Medical History:  Diagnosis Date   Anxiety    mild- past  hx   Arthritis    Rt hip   Cancer (HCC) 04/2019   Skin Rt hand. Dr. Yetta Barre   Diabetes mellitus without complication (HCC)    GERD (gastroesophageal reflux disease)    Tums or omeprozole   Heart murmur    History of IBS    History of kidney stones    30 years ago   Hypertension 2009   Meds controlled    Family History  Problem Relation Age of Onset   Breast cancer Mother    Prostate cancer Father    Cancer Father    Colon cancer Neg Hx    Colon polyps Neg Hx    Esophageal cancer Neg Hx    Stomach cancer Neg Hx    Rectal cancer Neg Hx    Social History   Social History Narrative   Not on file   Review of Systems  Constitutional:  Negative for fever and malaise/fatigue.  HENT:  Negative for congestion.   Eyes:  Negative for blurred vision.  Respiratory:  Negative for cough and shortness of breath.   Cardiovascular:  Negative  for chest pain, palpitations and leg swelling.  Gastrointestinal:  Negative for vomiting.  Musculoskeletal:  Negative for back pain.  Skin:  Negative for rash.  Neurological:  Negative for loss of consciousness and headaches.     Objective:  Vitals: BP 130/84 (BP Location: Left Arm, Patient Position: Sitting, Cuff Size: Large)   Pulse 73   Ht 5\' 11"  (1.803 m)   Wt 214 lb (97.1 kg)   SpO2 98%   BMI 29.85 kg/m   Physical Exam Constitutional:      General: He is not in acute distress.    Appearance: Normal appearance. He is not ill-appearing.  HENT:     Head: Normocephalic and atraumatic.  Cardiovascular:     Rate and Rhythm: Normal rate and regular rhythm.     Pulses: Normal pulses.     Heart sounds: Normal heart sounds. No murmur heard.    No friction rub. No gallop.  Pulmonary:     Effort: Pulmonary effort is normal. No respiratory distress.     Breath sounds: Normal breath sounds. No wheezing or rales.  Skin:    General: Skin is warm and dry.  Neurological:     Mental Status: He is alert and oriented to person, place, and time. Mental status is at baseline.  Psychiatric:        Mood and Affect: Mood normal.        Behavior: Behavior normal.        Thought Content: Thought content normal.        Judgment: Judgment normal.     Results:  Studies Obtained And Personally Reviewed By Me: Labs:     Component Value Date/Time   NA 140 07/17/2023 0957   NA 139 04/08/2021 1401   K 4.1 07/17/2023 0957   CL 103 07/17/2023 0957   CO2 28 07/17/2023 0957   GLUCOSE 108 (H) 07/17/2023 0957   BUN 16 07/17/2023 0957   BUN 16 04/08/2021 1401   CREATININE 1.02 07/17/2023 0957   CALCIUM 9.8 07/17/2023 0957   PROT 7.2 01/22/2024 1026   ALBUMIN 4.2 08/16/2016 1147   AST 22 01/22/2024 1026   ALT 27 01/22/2024 1026   ALKPHOS 44 08/16/2016 1147   BILITOT 0.5 01/22/2024 1026   GFRNONAA 89 05/17/2021 1143   GFRAA 103 05/17/2021 1143    Lab Results  Component Value Date   WBC 4.4  07/17/2023   HGB 14.9 07/17/2023   HCT 41.9 07/17/2023   MCV 93.7 07/17/2023   PLT 170 07/17/2023   Lab Results  Component Value Date   CHOL 138 01/22/2024   HDL 59 01/22/2024   LDLCALC 58 01/22/2024   TRIG 129 01/22/2024   CHOLHDL 2.3 01/22/2024   Lab Results  Component Value Date   HGBA1C 5.7 (H) 01/22/2024    Lab Results  Component Value Date   PSA 1.37 07/17/2023   PSA 1.81 07/11/2022   PSA 1.51 05/17/2021    Assessment & Plan:   Hypertension treated with 25 mg hydrochlorothiazide daily and 20 mg Benicar. Blood Pressure: hypertensive at 150/100. On recheck 130/84.   Hyperlipidemia treated with 5 mg Crestor daily. 01/22/24 Lipid Panel WNL.   Diabetes Mellitus treated with 1 mg Semaglutide injected weekly. 01/22/24 HgbA1c 5.7, same as 07/17/23. Had eye exam last year - we do not have records of this, will contact for them  Reviewed 01/22/24 Hepatic Function Panel: WNL  Vaccine Counseling: Flu - updated today & Shingles - discussed, agreeable to receiving this on his own time.    I,Emily Lagle,acting as a Neurosurgeon for Margaree Mackintosh, MD.,have documented all relevant documentation on the behalf of Margaree Mackintosh, MD,as directed by  Margaree Mackintosh, MD while in the presence of Margaree Mackintosh, MD.   I, Margaree Mackintosh, MD, have reviewed all documentation for this visit. The documentation on 01/24/24 for the exam, diagnosis, procedures, and orders are all accurate and complete.

## 2024-01-24 LAB — MICROALBUMIN / CREATININE URINE RATIO
Creatinine, Urine: 90 mg/dL (ref 20–320)
Microalb Creat Ratio: 3 mg/g{creat} (ref <30)
Microalb, Ur: 0.3 mg/dL

## 2024-01-24 NOTE — Patient Instructions (Addendum)
It was a pleasure to see you today. Flu vaccine given. Labs are stable. See you in July for wellness exam.

## 2024-02-15 ENCOUNTER — Other Ambulatory Visit: Payer: Self-pay | Admitting: Internal Medicine

## 2024-02-15 MED ORDER — OLMESARTAN MEDOXOMIL 20 MG PO TABS: 20.0000 mg | ORAL_TABLET | Freq: Every day | ORAL | 1 refills | Status: DC

## 2024-02-15 NOTE — Telephone Encounter (Signed)
 Copied from CRM [id number]. Topic: Clinical - Medication Refill >> Feb 15, 2024 11:04 AM Priscille Loveless wrote: Most Recent Primary Care Visit:  Provider: Margaree Mackintosh  Department: [facility]  Visit Type: OFFICE VISIT  Date: 01/23/2024  Medication: olmesartan (BENICAR) 20 MG tablet  Has the patient contacted their pharmacy? Yes   Is this the correct pharmacy for this prescription? Yes  This is the patient's preferred pharmacy:  [facility] [address] [facility] - [place], [place] - [address] [facility] [address] [facility] [address] [place] [address] Phone: [phone] Fax: [phone]   Has the prescription been filled recently? Yes  Is the patient out of the medication? Yes  Has the patient been seen for an appointment in the last year OR does the patient have an upcoming appointment? Yes  Can we respond through MyChart? Yes  Agent: Please be advised that Rx refills may take up to 3 business days. We ask that you follow-up with your pharmacy.

## 2024-02-17 ENCOUNTER — Other Ambulatory Visit: Payer: Self-pay | Admitting: Internal Medicine

## 2024-02-19 ENCOUNTER — Other Ambulatory Visit: Payer: Self-pay | Admitting: Internal Medicine

## 2024-02-20 ENCOUNTER — Telehealth: Payer: Self-pay

## 2024-02-20 NOTE — Telephone Encounter (Signed)
 Contact patient to schedule office visit as last appt was  02/17/23. Once schedule Ozempic will be sent

## 2024-02-21 ENCOUNTER — Encounter: Payer: Self-pay | Admitting: Internal Medicine

## 2024-02-21 ENCOUNTER — Ambulatory Visit (INDEPENDENT_AMBULATORY_CARE_PROVIDER_SITE_OTHER): Payer: Medicare Other | Admitting: Internal Medicine

## 2024-02-21 VITALS — BP 140/80 | HR 87 | Wt 216.8 lb

## 2024-02-21 DIAGNOSIS — E119 Type 2 diabetes mellitus without complications: Secondary | ICD-10-CM

## 2024-02-21 DIAGNOSIS — Z7985 Long-term (current) use of injectable non-insulin antidiabetic drugs: Secondary | ICD-10-CM

## 2024-02-21 MED ORDER — SEMAGLUTIDE (1 MG/DOSE) 4 MG/3ML ~~LOC~~ SOPN: 1.0000 mg | PEN_INJECTOR | SUBCUTANEOUS | 3 refills | Status: DC

## 2024-02-21 NOTE — Progress Notes (Signed)
 Name: [name] [name]  MRN/ DOB: [id number], [birth year]   Age/ Sex: 68 y.o., male    PCP: Margaree Mackintosh, MD   Reason for Endocrinology Evaluation: Type 2 Diabetes Mellitus     Date of Initial Endocrinology Visit: 02/17/2023    PATIENT IDENTIFIER: [name] [name] is a 68 y.o. male with a past medical history of DM, CAD, IBS . The patient presented for initial endocrinology clinic visit on 02/17/2023 for consultative assistance with his diabetes management.    HPI: Mr. Glasheen was    Diagnosed with DM 2020 Prior Medications tried/Intolerance: Januvia - no intolerance  Hemoglobin A1c has ranged from 5.9% in 2022, peaking at 7.3 in 2020.  On his initial visit to our clinic he had an A1c of 6.2 percent, he was on Ozempic, will increase the dose    SUBJECTIVE:   During the last visit (02/17/2023): A1c 6.2%  Today (02/21/24): [name] [name] is here for follow-up on diabetes management.  The patient has NOT been to our clinic in 1 year.  He checks his blood sugars 1-2 times daily. The patient has  had hypoglycemic episodes since the last clinic visit.   Has mild constipation , which is manageable  Denies nausea or vomiting     HOME DIABETES REGIMEN: Ozempic 1 mg weekly    Statin: yes ACE-I/ARB: yes    METER DOWNLOAD SUMMARY: n/a   DIABETIC COMPLICATIONS: Microvascular complications:   Denies: CKD, retinopathy , neuropathy  Last eye exam: Completed 01/2022  Macrovascular complications:   Denies: PVD, CVA, CAD   PAST HISTORY: Past Medical History:  Past Medical History:  Diagnosis Date   Anxiety    mild- past  hx   Arthritis    Rt hip   Cancer (HCC) 04/2019   Skin Rt hand. Dr. Yetta Barre   Diabetes mellitus without complication (HCC)    GERD (gastroesophageal reflux disease)    Tums or omeprozole   Heart murmur    History of IBS    History of kidney stones    30 years ago   Hypertension 2009   Meds controlled   Past Surgical History:  Past Surgical  History:  Procedure Laterality Date   COLONOSCOPY  2005   has IBS   LEFT HEART CATH AND CORONARY ANGIOGRAPHY N/A 04/12/2021   Procedure: LEFT HEART CATH AND CORONARY ANGIOGRAPHY;  Surgeon: Lyn Records, MD;  Location: [facility];  Service: Cardiovascular;  Laterality: N/A;   renal stone     TOTAL HIP ARTHROPLASTY Right 05/10/2019   Procedure: RIGHT TOTAL HIP ARTHROPLASTY ANTERIOR APPROACH;  Surgeon: Kathryne Hitch, MD;  Location: [facility];  Service: Orthopedics;  Laterality: Right;    Social History:  reports that he has never smoked. He has never been exposed to tobacco smoke. He has never used smokeless tobacco. He reports current alcohol use of about 2.0 - 3.0 standard drinks of alcohol per week. He reports that he does not use drugs. Family History:  Family History  Problem Relation Age of Onset   Breast cancer Mother    Prostate cancer Father    Cancer Father    Colon cancer Neg Hx    Colon polyps Neg Hx    Esophageal cancer Neg Hx    Stomach cancer Neg Hx    Rectal cancer Neg Hx      HOME MEDICATIONS: Allergies as of 02/21/2024       Reactions   Iohexol     Code:  RASH, Desc: PER MARY SILER (TECH @ [facility])STATES PT HAD REACTION TO CONTRAST MANY YRS PRIOR. 01/05/09/RM, Onset Date: [id number]   Miralax [polyethylene Glycol] Other (See Comments)   Caused severe abdominal pain with bowel prep ( 238 grams )    Shellfish Allergy Nausea And Vomiting        Medication List        Accurate as of February 21, 2024 11:10 AM. If you have any questions, ask your nurse or doctor.          Accu-Chek FastClix Lancets Misc USE TO TEST BLOOD GLUCOSE BEFORE BREAKFAST AND SUPPER   Accu-Chek Guide test strip Generic drug: glucose blood USE TO TEST BLOOD GLUCOSE BEFORE BREAKFAST AND SUPPER   ALPRAZolam 0.5 MG tablet Commonly known as: XANAX TAKE ONE TABLET BY MOUTH THREE TIMES A DAY   aspirin EC 81 MG tablet Take 81 mg by mouth daily. Swallow whole.   buPROPion  150 MG 24 hr tablet Commonly known as: WELLBUTRIN XL TAKE ONE TABLET BY MOUTH ONE TIME DAILY   celecoxib 200 MG capsule Commonly known as: CELEBREX TAKE ONE CAPSULE BY MOUTH TWICE A DAY   hydrochlorothiazide 25 MG tablet Commonly known as: HYDRODIURIL TAKE ONE TABLET BY MOUTH ONE TIME DAILY WITH LOSARTAN   hydrocortisone 2.5 % rectal cream Commonly known as: Proctozone-HC Place 1 Application rectally 2 (two) times daily.   olmesartan 20 MG tablet Commonly known as: BENICAR Take 1 tablet (20 mg total) by mouth daily.   pantoprazole 40 MG tablet Commonly known as: PROTONIX TAKE ONE TABLET BY MOUTH ONE TIME DAILY   promethazine 25 MG tablet Commonly known as: PHENERGAN Take 1 tablet (25 mg total) by mouth every 8 (eight) hours as needed for nausea or vomiting.   rosuvastatin 5 MG tablet Commonly known as: CRESTOR TAKE ONE TABLET BY MOUTH ONE TIME DAILY   Semaglutide (1 MG/DOSE) 4 MG/3ML Sopn Inject 1 mg as directed once a week.   sildenafil 100 MG tablet Commonly known as: VIAGRA TAKE ONE-HALF TO ONE TABLET BY MOUTH ONE TIME DAILY AS NEEDED ERECTILE DYSFUNCTION.   tadalafil 20 MG tablet Commonly known as: CIALIS TAKE ONE-HALF TO ONE TABLET BY MOUTH EVERY OTHER DAY AS NEEDED FOR ERECTILE DYSFUNCTION         ALLERGIES: Allergies  Allergen Reactions   Iohexol      Code: RASH, Desc: PER MARY SILER (TECH @ [facility]STATES PT HAD REACTION TO CONTRAST MANY YRS PRIOR. 01/05/09/RM, Onset Date: [id number]    Miralax [Polyethylene Glycol] Other (See Comments)    Caused severe abdominal pain with bowel prep ( 238 grams )    Shellfish Allergy Nausea And Vomiting     REVIEW OF SYSTEMS: A comprehensive ROS was conducted with the patient and is negative except as per HPI     OBJECTIVE:   VITAL SIGNS: BP (!) 140/80 (BP Location: Right Arm, Patient Position: Sitting)   Pulse 87   Wt 216 lb 12.8 oz (98.3 kg)   SpO2 97%   BMI 30.24 kg/m    PHYSICAL EXAM:  General: Pt  appears well and is in NAD  Lungs: Clear with good BS bilat   Heart: RRR   Abdomen:  soft, nontender  Extremities:  Lower extremities - No pretibial edema. No lesions.  Neuro: MS is good with appropriate affect, pt is alert and Ox3    DM foot exam: 02/21/2024  The skin of the feet is intact without sores or ulcerations. The pedal pulses are  2+ on right and 2+ on left. The sensation is intact to a screening 5.07, 10 gram monofilament bilaterally   DATA REVIEWED:  Lab Results  Component Value Date   HGBA1C 5.7 (H) 01/22/2024   HGBA1C 5.7 (H) 07/17/2023   HGBA1C 5.8 (H) 04/25/2023    Latest Reference Range & Units 01/22/24 10:26  Mean Plasma Glucose mg/dL 540  AG Ratio 1.0 - 2.5 (calc) 2.0  AST 10 - 35 U/L 22  ALT 9 - 46 U/L 27  Total Protein 6.1 - 8.1 g/dL 7.2  Bilirubin, Direct 0.0 - 0.2 mg/dL 0.1  Indirect Bilirubin 0.2 - 1.2 mg/dL (calc) 0.4  Total Bilirubin 0.2 - 1.2 mg/dL 0.5  Total CHOL/HDL Ratio <5.0 (calc) 2.3  Cholesterol <200 mg/dL 981  HDL Cholesterol > OR = 40 mg/dL 59  LDL Cholesterol (Calc) mg/dL (calc) 58  Non-HDL Cholesterol (Calc) <130 mg/dL (calc) 79  Triglycerides <150 mg/dL 191    ASSESSMENT / PLAN / RECOMMENDATIONS:   1) Type 2 Diabetes Mellitus, Optimally controlled, Without complications - Most recent A1c of 5.7 %. Goal A1c < 7.0 %.    -His A1c is optimal -No changes   MEDICATIONS: Continue Ozempic 1 mg weekly  EDUCATION / INSTRUCTIONS: BG monitoring instructions: Patient is instructed to check his blood sugars 2-3 times a week.    2) Diabetic complications:  Eye: Does not have known diabetic retinopathy.  Neuro/ Feet: Does not have known diabetic peripheral neuropathy. Renal: Patient does not have known baseline CKD. He is on an ACEI/ARB at present.   Follow-up in 6 months  Signed electronically by: Lyndle Herrlich, MD  [facility]  [facility] [address] [place], [place]  [address] Phone: [phone] FAX: [phone]   CC: Margaree Mackintosh, MD [address] [place] [address] Phone: [phone]  Fax: [phone]    Return to Endocrinology clinic as below: Future Appointments  Date Time Provider Department Center  07/22/2024  9:20 AM [facility]  07/23/2024  3:00 PM Baxley, Luanna Cole, MD [facility]

## 2024-02-21 NOTE — Patient Instructions (Signed)
 Continue Ozempic 1 mg weekly     HOW TO TREAT LOW BLOOD SUGARS (Blood sugar LESS THAN 70 MG/DL) Please follow the RULE OF 15 for the treatment of hypoglycemia treatment (when your (blood sugars are less than 70 mg/dL)   STEP 1: Take 15 grams of carbohydrates when your blood sugar is low, which includes:  3-4 GLUCOSE TABS  OR 3-4 OZ OF JUICE OR REGULAR SODA OR ONE TUBE OF GLUCOSE GEL    STEP 2: RECHECK blood sugar in 15 MINUTES STEP 3: If your blood sugar is still low at the 15 minute recheck --> then, go back to STEP 1 and treat AGAIN with another 15 grams of carbohydrates.

## 2024-02-21 NOTE — Telephone Encounter (Signed)
 Coming in today at 1110

## 2024-04-28 ENCOUNTER — Other Ambulatory Visit: Payer: Self-pay | Admitting: Internal Medicine

## 2024-05-01 LAB — HM DIABETES EYE EXAM

## 2024-05-08 ENCOUNTER — Other Ambulatory Visit: Payer: Self-pay | Admitting: Internal Medicine

## 2024-05-13 ENCOUNTER — Other Ambulatory Visit: Payer: Self-pay | Admitting: Internal Medicine

## 2024-07-10 ENCOUNTER — Other Ambulatory Visit: Payer: Self-pay | Admitting: Internal Medicine

## 2024-07-22 ENCOUNTER — Other Ambulatory Visit: Payer: Medicare Other

## 2024-07-23 ENCOUNTER — Ambulatory Visit: Payer: Medicare Other | Admitting: Internal Medicine

## 2024-08-12 ENCOUNTER — Other Ambulatory Visit

## 2024-08-13 ENCOUNTER — Encounter: Payer: Self-pay | Admitting: Family Medicine

## 2024-08-13 ENCOUNTER — Telehealth: Payer: Self-pay

## 2024-08-13 ENCOUNTER — Ambulatory Visit (INDEPENDENT_AMBULATORY_CARE_PROVIDER_SITE_OTHER): Admitting: Family Medicine

## 2024-08-13 VITALS — HR 68 | Ht 71.0 in | Wt 204.0 lb

## 2024-08-13 DIAGNOSIS — H9313 Tinnitus, bilateral: Secondary | ICD-10-CM | POA: Diagnosis not present

## 2024-08-13 DIAGNOSIS — H9312 Tinnitus, left ear: Secondary | ICD-10-CM | POA: Insufficient documentation

## 2024-08-13 MED ORDER — PREDNISONE 50 MG PO TABS: ORAL_TABLET | ORAL | 0 refills | Status: DC

## 2024-08-13 MED ORDER — DIPHENHYDRAMINE HCL 50 MG PO TABS: ORAL_TABLET | ORAL | 0 refills | Status: AC

## 2024-08-13 NOTE — Patient Instructions (Signed)
 MRI Brain w MRI neck w/o Tried manipulation  See me in 6 weeks

## 2024-08-13 NOTE — Assessment & Plan Note (Addendum)
 Seems to be potentially vascular in nature, seems to be worse with different types of movement.  Patient seems to be more left-sided than right-sided.  It can be associated with some neck pain.  Do feel MRI is necessary with patient seeing other providers without any findings at the moment.  Affecting daily activities, making patient as well as wife more nervous.  Attempted osteopathic manipulation but do not think that this is going to make a significant difference in this individual and think it is more vascular in nature.  Follow-up with me again after imaging to discuss further treatment options.  Has had allergy to contrast previously but has pretreated with Benadryl  in the past and done fine.  We discussed different medications but once again with the positional changes in the intensity I do feel that there is something else that could be potentially contributing including vascular, neurogenic, lesion.

## 2024-08-13 NOTE — Progress Notes (Addendum)
 [name] [address] [place] [address] Phone: [phone] Subjective:   [name] [name], am serving as a scribe for Dr. Arthea [name].  I'm seeing this patient by the request  of:  [name] [name], [name] [name] PARAS, [name] [name]  CC: Neck pain  YEP:[name] [name]  [name] [name] is a 68 y.o. male coming in with complaint of neck pain. Patient states that he has tinnitus. Rotation causes increase frequency when turning head to R. Started in his late 64s. Went away but came back. Denies any neck tightness. Does have L scapular pain which is intermittent. Patient has seen ENT for tinnitus.    Patient did have some x-rays taken in August 2024 showing the patient did have moderate arthritic changes from C4-C7.  Past Medical History:  Diagnosis Date   Anxiety    mild- past  hx   Arthritis    Rt hip   Cancer (HCC) 04/2019   Skin Rt hand. Dr. Joshua   Diabetes mellitus without complication (HCC)    GERD (gastroesophageal reflux disease)    Tums or omeprozole   Heart murmur    History of IBS    History of kidney stones    30 years ago   Hypertension 2009   Meds controlled   Past Surgical History:  Procedure Laterality Date   COLONOSCOPY  2005   has IBS   LEFT HEART CATH AND CORONARY ANGIOGRAPHY N/A 04/12/2021   Procedure: LEFT HEART CATH AND CORONARY ANGIOGRAPHY;  Surgeon: [name] Victory ORN, [name] [name];  [facility];  Service: Cardiovascular;  Laterality: N/A;   renal stone     TOTAL HIP ARTHROPLASTY Right 05/10/2019   Procedure: RIGHT TOTAL HIP ARTHROPLASTY ANTERIOR APPROACH;  Surgeon: Vernetta Lonni GRADE, [name] [name];  Location: [facility];  Service: Orthopedics;  Laterality: Right;   Social History   Socioeconomic History   Marital status: Married    Spouse name: Not on file   Number of children: Not on file   Years of education: Not on file   Highest education level: 12th grade  Occupational History   Not on file  Tobacco Use   Smoking status: Never    Passive  exposure: Never   Smokeless tobacco: Never  Vaping Use   Vaping status: Never Used  Substance and Sexual Activity   Alcohol use: Yes    Alcohol/week: 2.0 - 3.0 standard drinks of alcohol    Types: 2 - 3 Shots of liquor per week    Comment: occ   Drug use: No   Sexual activity: Yes  Other Topics Concern   Not on file  Social History Narrative   Not on file   Social Drivers of Health   Financial Resource Strain: Patient Declined (01/20/2024)   Overall Financial Resource Strain (CARDIA)    Difficulty of Paying Living Expenses: Patient declined  Food Insecurity: Patient Declined (01/20/2024)   Hunger Vital Sign    Worried About Running Out of Food in the Last Year: Patient declined    Ran Out of Food in the Last Year: Patient declined  Transportation Needs: Patient Declined (01/20/2024)   PRAPARE - Administrator, Civil Service (Medical): Patient declined    Lack of Transportation (Non-Medical): Patient declined  Physical Activity: Unknown (01/20/2024)   Exercise Vital Sign    Days of Exercise per Week: 5 days    Minutes of Exercise per Session: Patient declined  Stress: Patient Declined (01/20/2024)   [employer] of Occupational  Health - Occupational Stress Questionnaire    Feeling of Stress : Patient declined  Social Connections: Unknown (01/20/2024)   Social Connection and Isolation Panel    Frequency of Communication with Friends and Family: More than three times a week    Frequency of Social Gatherings with Friends and Family: More than three times a week    Attends Religious Services: Patient declined    Database administrator or Organizations: Yes    Attends Banker Meetings: Patient declined    Marital Status: Married   Allergies  Allergen Reactions   Iohexol       Code: RASH, Desc: PER MARY SILER (TECH @ [facility])STATES PT HAD REACTION TO CONTRAST MANY YRS PRIOR. 01/05/09/RM, Onset Date: 88867992    Miralax  [Polyethylene Glycol] Other (See  Comments)    Caused severe abdominal pain with bowel prep ( 238 grams )    Shellfish Allergy Nausea And Vomiting   Family History  Problem Relation Age of Onset   Breast cancer Mother    Prostate cancer Father    Cancer Father    Colon cancer Neg Hx    Colon polyps Neg Hx    Esophageal cancer Neg Hx    Stomach cancer Neg Hx    Rectal cancer Neg Hx     Current Outpatient Medications (Endocrine & Metabolic):    Semaglutide , 1 MG/DOSE, 4 MG/3ML SOPN, Inject 1 mg as directed once a week.  Current Outpatient Medications (Cardiovascular):    hydrochlorothiazide  (HYDRODIURIL ) 25 MG tablet, TAKE ONE TABLET BY MOUTH ONE TIME DAILY WITH LOSARTAN    olmesartan  (BENICAR ) 20 MG tablet, TAKE ONE TABLET BY MOUTH ONE TIME DAILY   rosuvastatin  (CRESTOR ) 5 MG tablet, TAKE ONE TABLET BY MOUTH ONE TIME DAILY   sildenafil  (VIAGRA ) 100 MG tablet, TAKE ONE-HALF TO ONE TABLET BY MOUTH ONE TIME DAILY AS NEEDED ERECTILE DYSFUNCTION.   tadalafil  (CIALIS ) 20 MG tablet, TAKE ONE-HALF TO ONE TABLET BY MOUTH EVERY OTHER DAY AS NEEDED FOR ERECTILE DYSFUNCTION  Current Outpatient Medications (Respiratory):    promethazine  (PHENERGAN ) 25 MG tablet, Take 1 tablet (25 mg total) by mouth every 8 (eight) hours as needed for nausea or vomiting.  Current Outpatient Medications (Analgesics):    aspirin  EC 81 MG tablet, Take 81 mg by mouth daily. Swallow whole.   celecoxib  (CELEBREX ) 200 MG capsule, TAKE ONE CAPSULE BY MOUTH TWICE A DAY   Current Outpatient Medications (Other):    Accu-Chek FastClix Lancets MISC, USE TO TEST BLOOD GLUCOSE BEFORE BREAKFAST AND SUPPER   ACCU-CHEK GUIDE test strip, USE TO TEST BLOOD GLUCOSE BEFORE BREAKFAST AND SUPPER   ALPRAZolam  (XANAX ) 0.5 MG tablet, TAKE ONE TABLET BY MOUTH THREE TIMES A DAY   buPROPion  (WELLBUTRIN  XL) 150 MG 24 hr tablet, TAKE ONE TABLET BY MOUTH ONE TIME DAILY   hydrocortisone  (PROCTOZONE -HC) 2.5 % rectal cream, Place 1 Application rectally 2 (two) times daily.    pantoprazole  (PROTONIX ) 40 MG tablet, TAKE ONE TABLET BY MOUTH ONE TIME DAILY   Reviewed prior external information including notes and imaging from  primary care provider As well as notes that were available from care everywhere and other healthcare systems.  Past medical history, social, surgical and family history all reviewed in electronic medical record.  No pertanent information unless stated regarding to the chief complaint.   Review of Systems:  No , visual changes, nausea, vomiting, diarrhea, constipation, dizziness, abdominal pain, skin rash, fevers, chills, night sweats, weight loss, swollen lymph nodes, body aches, joint  swelling, chest pain, shortness of breath, mood changes. POSITIVE muscle aches, ringing in the ears, headache  Objective  Pulse 68, height 5' 11 (1.803 m), weight 204 lb (92.5 kg), SpO2 98%.   General: No apparent distress alert and oriented x3 mood and affect normal, dressed appropriately.  HEENT: Pupils equal, extraocular movements intact  Respiratory: Patient's speak in full sentences and does not appear short of breath  Cardiovascular: No lower extremity edema, non tender, no erythema  Neck exam shows patient does have some limited extension of the neck noted.  Some tightness noted with sidebending bilaterally. Negative Spurling's noted.  Patient does state that with head bending extension into the left it seems to audibly get louder.  Patient states even with palpation of the neck sometimes it seems to get a little bit louder.  Denies any true dizziness.   Impression and Recommendations:  Tinnitus aurium, left Seems to be potentially vascular in nature, seems to be worse with different types of movement.  Patient seems to be more left-sided than right-sided.  It can be associated with some neck pain.  Do feel MRI is necessary with patient seeing other providers without any findings at the moment.  Affecting daily activities, making patient as well as wife  more nervous.  Attempted osteopathic manipulation but do not think that this is going to make a significant difference in this individual and think it is more vascular in nature.  Follow-up with me again after imaging to discuss further treatment options.  Has had allergy to contrast previously but has pretreated with Benadryl  in the past and done fine.  We discussed different medications but once again with the positional changes in the intensity I do feel that there is something else that could be potentially contributing including vascular, neurogenic, lesion.   The above documentation has been reviewed and is accurate and complete [name] Karn M Eastyn Dattilo, DO

## 2024-08-14 ENCOUNTER — Ambulatory Visit: Admitting: Sports Medicine

## 2024-08-21 ENCOUNTER — Ambulatory Visit (INDEPENDENT_AMBULATORY_CARE_PROVIDER_SITE_OTHER): Payer: Medicare Other | Admitting: Internal Medicine

## 2024-08-21 ENCOUNTER — Encounter: Payer: Self-pay | Admitting: Internal Medicine

## 2024-08-21 VITALS — BP 130/82 | HR 78 | Ht 71.0 in | Wt 215.0 lb

## 2024-08-21 DIAGNOSIS — E119 Type 2 diabetes mellitus without complications: Secondary | ICD-10-CM

## 2024-08-21 DIAGNOSIS — Z7985 Long-term (current) use of injectable non-insulin antidiabetic drugs: Secondary | ICD-10-CM

## 2024-08-21 LAB — POCT GLYCOSYLATED HEMOGLOBIN (HGB A1C): Hemoglobin A1C: 5.6 % (ref 4.0–5.6)

## 2024-08-21 MED ORDER — SEMAGLUTIDE (2 MG/DOSE) 8 MG/3ML ~~LOC~~ SOPN: 2.0000 mg | PEN_INJECTOR | SUBCUTANEOUS | 3 refills | Status: DC

## 2024-08-21 NOTE — Progress Notes (Signed)
 Name: [name]  MRN/ DOB: [id number], Dec 24, 1956   Age/ Sex: 68 y.o., male    PCP: Perri Ronal PARAS, MD   Reason for Endocrinology Evaluation: Type 2 Diabetes Mellitus     Date of Initial Endocrinology Visit: 02/17/2023    PATIENT IDENTIFIER: [name] is a 68 y.o. male with a past medical history of DM, CAD, IBS . The patient presented for initial endocrinology clinic visit on 02/17/2023 for consultative assistance with his diabetes management.    HPI: [name] was    Diagnosed with DM 2020 Prior Medications tried/Intolerance: Januvia  - no intolerance  Hemoglobin A1c has ranged from 5.9% in 2022, peaking at 7.3 in 2020.  On his initial visit to our clinic he had an A1c of 6.2 percent, he was on Ozempic , will increase the dose    SUBJECTIVE:   During the last visit (02/21/2024): A1c 5.7%  Today (08/21/24): [name] [name] is here for follow-up on diabetes management. He checks his blood sugars 1 times daily. The patient has not had hypoglycemic episodes since the last clinic visit.    Weight remains stable over the past 6 months Continues with mild constipation, uses stool softeners when needed Has mild constipation , which is manageable  Denies nausea or vomiting  Scheduled for cataract November, 3rd    HOME DIABETES REGIMEN: Ozempic  1 mg weekly    Statin: yes ACE-I/ARB: yes    METER DOWNLOAD SUMMARY:  Range 95- 130 mg/dL    DIABETIC COMPLICATIONS: Microvascular complications:   Denies: CKD, retinopathy , neuropathy  Last eye exam: Completed 05/01/2024  Macrovascular complications:   Denies: PVD, CVA, CAD   PAST HISTORY: Past Medical History:  Past Medical History:  Diagnosis Date   Anxiety    mild- past  hx   Arthritis    Rt hip   Cancer (HCC) 04/2019   Skin Rt hand. Dr. Joshua   Diabetes mellitus without complication (HCC)    GERD (gastroesophageal reflux disease)    Tums or omeprozole   Heart murmur    History of IBS     History of kidney stones    30 years ago   Hypertension 2009   Meds controlled   Past Surgical History:  Past Surgical History:  Procedure Laterality Date   COLONOSCOPY  2005   has IBS   LEFT HEART CATH AND CORONARY ANGIOGRAPHY N/A 04/12/2021   Procedure: LEFT HEART CATH AND CORONARY ANGIOGRAPHY;  Surgeon: Claudene Victory ORN, MD;  Location: [facility];  Service: Cardiovascular;  Laterality: N/A;   renal stone     TOTAL HIP ARTHROPLASTY Right 05/10/2019   Procedure: RIGHT TOTAL HIP ARTHROPLASTY ANTERIOR APPROACH;  Surgeon: Vernetta Lonni GRADE, MD;  Location: [facility];  Service: Orthopedics;  Laterality: Right;    Social History:  reports that he has never smoked. He has never been exposed to tobacco smoke. He has never used smokeless tobacco. He reports current alcohol use of about 2.0 - 3.0 standard drinks of alcohol per week. He reports that he does not use drugs. Family History:  Family History  Problem Relation Age of Onset   Breast cancer Mother    Prostate cancer Father    Cancer Father    Colon cancer Neg Hx    Colon polyps Neg Hx    Esophageal cancer Neg Hx    Stomach cancer Neg Hx    Rectal cancer Neg Hx      HOME MEDICATIONS: Allergies as of 08/21/2024  Reactions   Iohexol      Code: RASH, Desc: PER MARY SILER (TECH @ [facility])STATES PT HAD REACTION TO CONTRAST MANY YRS PRIOR. 01/05/09/RM, Onset Date: 88867992   Miralax  [polyethylene Glycol] Other (See Comments)   Caused severe abdominal pain with bowel prep ( 238 grams )    Shellfish Allergy Nausea And Vomiting        Medication List        Accurate as of August 21, 2024 11:13 AM. If you have any questions, ask your nurse or doctor.          Accu-Chek FastClix Lancets Misc USE TO TEST BLOOD GLUCOSE BEFORE BREAKFAST AND SUPPER   Accu-Chek Guide test strip Generic drug: glucose blood USE TO TEST BLOOD GLUCOSE BEFORE BREAKFAST AND SUPPER   ALPRAZolam  0.5 MG tablet Commonly known as: XANAX  TAKE  ONE TABLET BY MOUTH THREE TIMES A DAY   aspirin  EC 81 MG tablet Take 81 mg by mouth daily. Swallow whole.   buPROPion  150 MG 24 hr tablet Commonly known as: WELLBUTRIN  XL TAKE ONE TABLET BY MOUTH ONE TIME DAILY   celecoxib  200 MG capsule Commonly known as: CELEBREX  TAKE ONE CAPSULE BY MOUTH TWICE A DAY   diphenhydrAMINE  50 MG tablet Commonly known as: BENADRYL  Pt is also to take 50 mg of benadryl  on 09/03/2024 at 8:10 am. Please call [phone] with any questions.   hydrochlorothiazide  25 MG tablet Commonly known as: HYDRODIURIL  TAKE ONE TABLET BY MOUTH ONE TIME DAILY WITH LOSARTAN    hydrocortisone  2.5 % rectal cream Commonly known as: Proctozone -HC Place 1 Application rectally 2 (two) times daily.   olmesartan  20 MG tablet Commonly known as: BENICAR  TAKE ONE TABLET BY MOUTH ONE TIME DAILY   pantoprazole  40 MG tablet Commonly known as: PROTONIX  TAKE ONE TABLET BY MOUTH ONE TIME DAILY   predniSONE  50 MG tablet Commonly known as: DELTASONE  Pt to take 50 mg of prednisone  on 09/02/2024 at 8:10 pm, 50 mg of prednisone  on 09/03/2024 at 2:10 am, and 50 mg of prednisone  on 09/03/2024 at 8:10 am. Pt is also to take 50 mg of benadryl  on 09/03/2024 at 8:10 am. Please call [phone] with any questions.   promethazine  25 MG tablet Commonly known as: PHENERGAN  Take 1 tablet (25 mg total) by mouth every 8 (eight) hours as needed for nausea or vomiting.   rosuvastatin  5 MG tablet Commonly known as: CRESTOR  TAKE ONE TABLET BY MOUTH ONE TIME DAILY   Semaglutide  (1 MG/DOSE) 4 MG/3ML Sopn Inject 1 mg as directed once a week.   sildenafil  100 MG tablet Commonly known as: VIAGRA  TAKE ONE-HALF TO ONE TABLET BY MOUTH ONE TIME DAILY AS NEEDED ERECTILE DYSFUNCTION.   tadalafil  20 MG tablet Commonly known as: CIALIS  TAKE ONE-HALF TO ONE TABLET BY MOUTH EVERY OTHER DAY AS NEEDED FOR ERECTILE DYSFUNCTION         ALLERGIES: Allergies  Allergen Reactions   Iohexol       Code: RASH, Desc:  PER MARY SILER (TECH @ [facility])STATES PT HAD REACTION TO CONTRAST MANY YRS PRIOR. 01/05/09/RM, Onset Date: 88867992    Miralax  [Polyethylene Glycol] Other (See Comments)    Caused severe abdominal pain with bowel prep ( 238 grams )    Shellfish Allergy Nausea And Vomiting     REVIEW OF SYSTEMS: A comprehensive ROS was conducted with the patient and is negative except as per HPI     OBJECTIVE:   VITAL SIGNS: BP 130/82 (BP Location: Left Arm, Patient Position: Sitting, Cuff Size:  Normal)   Pulse 78   Ht 5' 11 (1.803 m)   Wt 215 lb (97.5 kg)   SpO2 95%   BMI 29.99 kg/m     Filed Weights   08/21/24 1111  Weight: 215 lb (97.5 kg)     PHYSICAL EXAM:  General: Pt appears well and is in NAD  Lungs: Clear with good BS bilat   Heart: RRR   Abdomen:  soft, nontender  Extremities:  Lower extremities - No pretibial edema. No lesions.  Neuro: MS is good with appropriate affect, pt is alert and Ox3    DM foot exam: 02/21/2024  The skin of the feet is intact without sores or ulcerations. The pedal pulses are 2+ on right and 2+ on left. The sensation is intact to a screening 5.07, 10 gram monofilament bilaterally   DATA REVIEWED:  Lab Results  Component Value Date   HGBA1C 5.7 (H) 01/22/2024   HGBA1C 5.7 (H) 07/17/2023   HGBA1C 5.8 (H) 04/25/2023    Latest Reference Range & Units 01/22/24 10:26  Mean Plasma Glucose mg/dL 882  AG Ratio 1.0 - 2.5 (calc) 2.0  AST 10 - 35 U/L 22  ALT 9 - 46 U/L 27  Total Protein 6.1 - 8.1 g/dL 7.2  Bilirubin, Direct 0.0 - 0.2 mg/dL 0.1  Indirect Bilirubin 0.2 - 1.2 mg/dL (calc) 0.4  Total Bilirubin 0.2 - 1.2 mg/dL 0.5  Total CHOL/HDL Ratio <5.0 (calc) 2.3  Cholesterol <200 mg/dL 861  HDL Cholesterol > OR = 40 mg/dL 59  LDL Cholesterol (Calc) mg/dL (calc) 58  Non-HDL Cholesterol (Calc) <130 mg/dL (calc) 79  Triglycerides <150 mg/dL 870    ASSESSMENT / PLAN / RECOMMENDATIONS:   1) Type 2 Diabetes Mellitus, Optimally controlled, Without  complications - Most recent A1c of 5.6 %. Goal A1c < 7.0 %.    -His A1c remains optimal, but he has noted increased cravings and no further weight loss, we have discussed increasing Ozempic  as below -Caution against GI side effects, encouraged weightbearing exercises/resistance exercise and starting a multivitamin    MEDICATIONS: Increase Ozempic  2 mg weekly  EDUCATION / INSTRUCTIONS: BG monitoring instructions: Patient is instructed to check his blood sugars 2-3 times a week.    2) Diabetic complications:  Eye: Does not have known diabetic retinopathy.  Neuro/ Feet: Does not have known diabetic peripheral neuropathy. Renal: Patient does not have known baseline CKD. He is on an ACEI/ARB at present.   Follow-up in 6 months  Signed electronically by: Stefano Redgie Butts, MD  [facility]  [facility] [address] [place], [place] [address] Phone: [phone] FAX: [phone]   CC: Perri Ronal PARAS, MD [address] [place] [address] Phone: [phone]  Fax: [phone]    Return to Endocrinology clinic as below: Future Appointments  Date Time Provider Department Center  08/27/2024  9:10 AM [facility] [place] [facility]  08/27/2024  9:50 AM [facility] [place] [facility]  09/23/2024  9:40 AM [facility] [address]  09/24/2024 10:30 AM Perri Ronal PARAS, MD [facility] [address]  09/25/2024  2:00 PM Claudene Arthea HERO, DO [facility] None

## 2024-08-21 NOTE — Patient Instructions (Signed)
Increase Ozempic 2 mg  weekly    HOW TO TREAT LOW BLOOD SUGARS (Blood sugar LESS THAN 70 MG/DL) Please follow the RULE OF 15 for the treatment of hypoglycemia treatment (when your (blood sugars are less than 70 mg/dL)   STEP 1: Take 15 grams of carbohydrates when your blood sugar is low, which includes:  3-4 GLUCOSE TABS  OR 3-4 OZ OF JUICE OR REGULAR SODA OR ONE TUBE OF GLUCOSE GEL    STEP 2: RECHECK blood sugar in 15 MINUTES STEP 3: If your blood sugar is still low at the 15 minute recheck --> then, go back to STEP 1 and treat AGAIN with another 15 grams of carbohydrates.

## 2024-08-27 ENCOUNTER — Ambulatory Visit
Admission: RE | Admit: 2024-08-27 | Discharge: 2024-08-27 | Disposition: A | Discharge status: Home / Self Care | Source: Ambulatory Visit | Attending: Family Medicine | Admitting: Family Medicine

## 2024-08-27 DIAGNOSIS — H9313 Tinnitus, bilateral: Secondary | ICD-10-CM

## 2024-08-27 MED ORDER — GADOPICLENOL 0.5 MMOL/ML IV SOLN
10.0000 mL | Freq: Once | INTRAVENOUS | Status: AC | PRN
  Administered 2024-08-27: 10 mL via INTRAVENOUS

## 2024-09-02 ENCOUNTER — Ambulatory Visit: Admitting: Internal Medicine

## 2024-09-02 ENCOUNTER — Telehealth: Payer: Self-pay | Admitting: Internal Medicine

## 2024-09-02 ENCOUNTER — Encounter: Payer: Self-pay | Admitting: Internal Medicine

## 2024-09-03 ENCOUNTER — Encounter: Payer: Self-pay | Admitting: Internal Medicine

## 2024-09-03 ENCOUNTER — Ambulatory Visit: Admitting: Internal Medicine

## 2024-09-03 VITALS — BP 120/80 | HR 78 | Ht 71.0 in | Wt 216.0 lb

## 2024-09-03 DIAGNOSIS — E119 Type 2 diabetes mellitus without complications: Secondary | ICD-10-CM | POA: Diagnosis not present

## 2024-09-03 DIAGNOSIS — I10 Essential (primary) hypertension: Secondary | ICD-10-CM | POA: Diagnosis not present

## 2024-09-03 DIAGNOSIS — Z8719 Personal history of other diseases of the digestive system: Secondary | ICD-10-CM

## 2024-09-03 DIAGNOSIS — R109 Unspecified abdominal pain: Secondary | ICD-10-CM | POA: Diagnosis not present

## 2024-09-03 DIAGNOSIS — Z860101 Personal history of adenomatous and serrated colon polyps: Secondary | ICD-10-CM

## 2024-09-03 DIAGNOSIS — R110 Nausea: Secondary | ICD-10-CM

## 2024-09-03 DIAGNOSIS — Z7985 Long-term (current) use of injectable non-insulin antidiabetic drugs: Secondary | ICD-10-CM

## 2024-09-03 DIAGNOSIS — E782 Mixed hyperlipidemia: Secondary | ICD-10-CM

## 2024-09-03 DIAGNOSIS — E1169 Type 2 diabetes mellitus with other specified complication: Secondary | ICD-10-CM

## 2024-09-03 LAB — POCT URINALYSIS DIP (CLINITEK)
Bilirubin, UA: NEGATIVE
Blood, UA: NEGATIVE
Glucose, UA: NEGATIVE mg/dL
Ketones, POC UA: NEGATIVE mg/dL
Leukocytes, UA: NEGATIVE
Nitrite, UA: NEGATIVE
POC PROTEIN,UA: NEGATIVE
Spec Grav, UA: 1.01 (ref 1.010–1.025)
Urobilinogen, UA: 0.2 U/dL
pH, UA: 7 (ref 5.0–8.0)

## 2024-09-03 MED ORDER — ONDANSETRON HCL 4 MG PO TABS: 4.0000 mg | ORAL_TABLET | Freq: Three times a day (TID) | ORAL | 0 refills | Status: DC | PRN

## 2024-09-03 NOTE — Progress Notes (Addendum)
 Patient Care Team: Perri Ronal PARAS, MD as PCP - General (Internal Medicine) Delford Maude BROCKS, MD as PCP - Cardiology (Cardiology)  Visit Date: 09/03/24  Subjective:    Patient ID: [name] [name] , Male   DOB: 1956-07-13, 68 y.o.    MRN: [id number]   68 y.o. Male presents today for acute visit for pain in upper mid abdomen. Patient has a past medical history of Essential Hypertension, Hyperlipidemia,  Irritable bowel syndrome, GERD and Diabetes Mellitus, and history of diverticulitis.   He was was working out recently and said he felt a mass/fullness in his mid abdomen abdomen in the vicinity of rectus abdominus  muscles of his abdomen. [place] he has noticed it before it just wasn't as prominent previously. He has history of lifting heavy bales of hay.    History of Diabetes Mellitus, Type II treated with semagludtide 2 mg injected once a week.Patient  wants to reduce dose of semaglutide  back to 1 mg because of nausea. Zofran  4 mg every 8 hours as needed prescribed for nausea.  History of Hypertension treated with hydrochlorothiazide  25 mg daily and olmesartan  20 mg daily. Blood pressure today is normal at 120/80    History of Hyperlipidemia treated with rosuvastatin  5 mg daily    History of GERD treated with Protonix  40 mg daily   History of Diverticulitis   History of Irritable bowel syndrome treated with Xanax  0.5 mg three times daily as needed.   His last Colonoscopy was 02/08/24. 5 adenomatous  colon polyps were  removed. His next colonoscopy is due 02/07/2025   Vaccine counseling: Declined flu vaccine today. Pneumovax vaccine given 05/01/2023.  Past Medical History:  Diagnosis Date   Anxiety    mild- past  hx   Arthritis    Rt hip   Cancer (HCC) 04/2019   Skin Rt hand. Dr. Joshua   Diabetes mellitus without complication (HCC)    GERD (gastroesophageal reflux disease)    Tums or omeprozole   Heart murmur    History of IBS    History of kidney stones    30 years ago    Hypertension 2009   Meds controlled     Family History  Problem Relation Age of Onset   Breast cancer Mother    Prostate cancer Father    Cancer Father    Colon cancer Neg Hx    Colon polyps Neg Hx    Esophageal cancer Neg Hx    Stomach cancer Neg Hx    Rectal cancer Neg Hx    Social history: Non-smoker.  Social alcohol consumption.  May have 1-2 drinks of Vodka. Married.  Wife retired from [employer].  He has 1 adult son from previous marriage.  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 He continues to enjoy horse riding.  Family history: Father died with complications of prostate cancer.  Mother died with complications of dementia.  1 brother.       Objective:   Vitals: BP 120/80   Pulse 78   Ht 5' 11 (1.803 m)   Wt 216 lb (98 kg)   SpO2 98%   BMI 30.13 kg/m   Skin warm and dry. No acute distress. Physical Exam Cardiovascular:     Rate and Rhythm: Normal rate and regular rhythm.  Pulmonary:     Breath sounds: Normal breath sounds.      Chest is clear to ausculatation     No LE edema Results:  Studies obtained and personally reviewed by me:  Colonoscopy 02/07/2022 The examined portion of the ileum was normal. Diverticulosis in the entire examined colon. Five 2 to 5 mm polyps in the descending colon, in the ascending colon and in the cecum, removed with a cold snare. Resected and retrieved. One 2 mm polyp in the ascending colon, removed with a cold biopsy forceps. Resected and retrieved. Non- bleeding internal hemorrhoids. Repeat in 2026   Labs:       Component Value Date/Time   NA 140 07/17/2023 0957   NA 139 04/08/2021 1401   K 4.1 07/17/2023 0957   CL 103 07/17/2023 0957   CO2 28 07/17/2023 0957   GLUCOSE 108 (H) 07/17/2023 0957   BUN 16 07/17/2023 0957   BUN 16 04/08/2021 1401   CREATININE 1.02 07/17/2023 0957   CALCIUM  9.8 07/17/2023 0957   PROT 7.2 01/22/2024 1026   ALBUMIN 4.2 08/16/2016 1147   AST 22 01/22/2024  1026   ALT 27 01/22/2024 1026   ALKPHOS 44 08/16/2016 1147   BILITOT 0.5 01/22/2024 1026   GFRNONAA 89 05/17/2021 1143   GFRAA 103 05/17/2021 1143     Lab Results  Component Value Date   WBC 4.4 07/17/2023   HGB 14.9 07/17/2023   HCT 41.9 07/17/2023   MCV 93.7 07/17/2023   PLT 170 07/17/2023    Lab Results  Component Value Date   CHOL 138 01/22/2024   HDL 59 01/22/2024   LDLCALC 58 01/22/2024   TRIG 129 01/22/2024   CHOLHDL 2.3 01/22/2024    Lab Results  Component Value Date   HGBA1C 5.6 08/21/2024      Lab Results  Component Value Date   PSA 1.37 07/17/2023   PSA 1.81 07/11/2022   PSA 1.51 05/17/2021       Assessment & Plan:   Diastasis Rectus abdominus: He was doing sit ups and said he felt a mass in abdomen in the vicinity of rectus muscles of his abdomen. [place] he has noticed it before it just wasn't as prominent. Upon physical exam, he reported it did not hurt when palpated.  Close observation without further evaluation ordered, he was told to call us  if findings changed.  Diabetes mellitus, Type II: treated with semagludtide 2 mg injected once a week. Patient wants to reduce dose back to 1 mg because he is experiencing  nausea.   Zofran  4 mg by mouth every 8 hours as needed for nausea prescribed   Hypertension: Treated with hydrochlorothiazide  25 mg daily and olmesartan  20 mg daily. Blood pressure today is  normal at  120/80    Diverticulitis- last episode 2009.  Irritable bowel syndrome: treated with Xanax  0.5 mg three times daily.   His last Colonoscopy was 02/08/24. Five adenomatous  colon polyps were found and removed. His next colonoscopy is due 02/07/2025    Vaccine counseling: Declined Influenza and shingles vaccine, UTD on Pneumococcal 20.   I,Makayla C Reid,acting as a scribe for Ronal JINNY Hailstone, MD.,have documented all relevant documentation on the behalf of Ronal JINNY Hailstone, MD,as directed by  Ronal JINNY Hailstone, MD while in the presence of Ronal JINNY Hailstone,  MD.  I, Ronal JINNY Hailstone, MD, have reviewed all documentation for this visit. The documentation on 09/03/2024 for the exam, diagnosis, procedures, and orders are all accurate and complete.

## 2024-09-03 NOTE — Patient Instructions (Addendum)
 Patient reassured about rectus discomfort. No hernia identified. Avoid heavy lifting if possible. Zofran  prescribed for nausea as needed. Doing well with GLP-1 medication for diabetes. Has Xanax  for irritable bowel syndrome if needed. Blood pressure well controlled on current regimen.

## 2024-09-04 ENCOUNTER — Encounter: Payer: Self-pay | Admitting: Internal Medicine

## 2024-09-04 LAB — MICROALBUMIN / CREATININE URINE RATIO
Creatinine, Urine: 204 mg/dL (ref 20–320)
Microalb Creat Ratio: 3 mg/g{creat} (ref <30)
Microalb, Ur: 0.6 mg/dL

## 2024-09-05 ENCOUNTER — Ambulatory Visit: Payer: Self-pay | Admitting: Family Medicine

## 2024-09-05 ENCOUNTER — Ambulatory Visit: Admitting: Family Medicine

## 2024-09-23 ENCOUNTER — Other Ambulatory Visit

## 2024-09-24 ENCOUNTER — Ambulatory Visit: Admitting: Internal Medicine

## 2024-09-25 ENCOUNTER — Ambulatory Visit: Admitting: Family Medicine

## 2024-09-30 ENCOUNTER — Other Ambulatory Visit

## 2024-09-30 DIAGNOSIS — E119 Type 2 diabetes mellitus without complications: Secondary | ICD-10-CM

## 2024-09-30 DIAGNOSIS — K580 Irritable bowel syndrome with diarrhea: Secondary | ICD-10-CM

## 2024-09-30 DIAGNOSIS — Z0000 Encounter for general adult medical examination without abnormal findings: Secondary | ICD-10-CM

## 2024-09-30 DIAGNOSIS — E1169 Type 2 diabetes mellitus with other specified complication: Secondary | ICD-10-CM

## 2024-09-30 DIAGNOSIS — N410 Acute prostatitis: Secondary | ICD-10-CM

## 2024-09-30 DIAGNOSIS — I10 Essential (primary) hypertension: Secondary | ICD-10-CM

## 2024-09-30 DIAGNOSIS — Z125 Encounter for screening for malignant neoplasm of prostate: Secondary | ICD-10-CM

## 2024-09-30 NOTE — Progress Notes (Signed)
 Lab only

## 2024-10-01 ENCOUNTER — Other Ambulatory Visit: Payer: Self-pay | Admitting: Internal Medicine

## 2024-10-01 LAB — COMPREHENSIVE METABOLIC PANEL WITH GFR
AG Ratio: 1.9 (calc) (ref 1.0–2.5)
ALT: 27 U/L (ref 9–46)
AST: 24 U/L (ref 10–35)
Albumin: 4.6 g/dL (ref 3.6–5.1)
Alkaline phosphatase (APISO): 51 U/L (ref 35–144)
BUN: 15 mg/dL (ref 7–25)
CO2: 28 mmol/L (ref 20–32)
Calcium: 9.4 mg/dL (ref 8.6–10.3)
Chloride: 102 mmol/L (ref 98–110)
Creat: 1.05 mg/dL (ref 0.70–1.28)
Globulin: 2.4 g/dL (ref 1.9–3.7)
Glucose, Bld: 105 mg/dL — ABNORMAL HIGH (ref 65–99)
Potassium: 3.6 mmol/L (ref 3.5–5.3)
Sodium: 139 mmol/L (ref 135–146)
Total Bilirubin: 0.7 mg/dL (ref 0.2–1.2)
Total Protein: 7 g/dL (ref 6.1–8.1)
eGFR: 76 mL/min/1.73m2 (ref >=60)

## 2024-10-01 LAB — HEMOGLOBIN A1C
Hgb A1c MFr Bld: 5.6 % (ref <5.7)
Mean Plasma Glucose: 114 mg/dL
eAG (mmol/L): 6.3 mmol/L

## 2024-10-01 LAB — CBC WITH DIFFERENTIAL/PLATELET
Absolute Lymphocytes: 1751 {cells}/uL (ref 850–3900)
Absolute Monocytes: 302 {cells}/uL (ref 200–950)
Basophils Absolute: 32 {cells}/uL (ref 0–200)
Basophils Relative: 0.7 %
Eosinophils Absolute: 59 {cells}/uL (ref 15–500)
Eosinophils Relative: 1.3 %
HCT: 42.4 % (ref 38.5–50.0)
Hemoglobin: 14.7 g/dL (ref 13.2–17.1)
MCH: 32.5 pg (ref 27.0–33.0)
MCHC: 34.7 g/dL (ref 32.0–36.0)
MCV: 93.8 fL (ref 80.0–100.0)
MPV: 8.9 fL (ref 7.5–12.5)
Monocytes Relative: 6.7 %
Neutro Abs: 2358 {cells}/uL (ref 1500–7800)
Neutrophils Relative %: 52.4 %
Platelets: 168 Thousand/uL (ref 140–400)
RBC: 4.52 Million/uL (ref 4.20–5.80)
RDW: 13 % (ref 11.0–15.0)
Total Lymphocyte: 38.9 %
WBC: 4.5 Thousand/uL (ref 3.8–10.8)

## 2024-10-01 LAB — LIPID PANEL
Cholesterol: 132 mg/dL (ref <200)
HDL: 53 mg/dL (ref >=40)
LDL Cholesterol (Calc): 58 mg/dL
Non-HDL Cholesterol (Calc): 79 mg/dL (ref <130)
Total CHOL/HDL Ratio: 2.5 (calc) (ref <5.0)
Triglycerides: 133 mg/dL (ref <150)

## 2024-10-01 LAB — MICROALBUMIN / CREATININE URINE RATIO
Creatinine, Urine: 204 mg/dL (ref 20–320)
Microalb Creat Ratio: 4 mg/g{creat} (ref <30)
Microalb, Ur: 0.8 mg/dL

## 2024-10-01 LAB — PSA: PSA: 1.69 ng/mL (ref <=4.00)

## 2024-10-03 ENCOUNTER — Ambulatory Visit: Admitting: Internal Medicine

## 2024-10-06 ENCOUNTER — Other Ambulatory Visit: Payer: Self-pay | Admitting: Internal Medicine

## 2024-10-17 NOTE — Progress Notes (Signed)
 Annual Wellness Visit   Patient Care Team: Jachai Okazaki, [name] PARAS, MD as PCP - General (Internal Medicine) Delford Maude BROCKS, MD as PCP - Cardiology (Cardiology)  Visit Date: 10/21/24   Chief Complaint  Patient presents with   Annual Exam   Subjective:  Patient: [name] [name], [name] [name] DOB: 1956/01/15, 68 y.o. MRN: [id number] Vitals:   10/21/24 1403  BP: (!) 142/80   JEDADIAH ABDALLAH is a 68 y.o. [name] [name] who presents today for his Annual Wellness Visit. Patient has DIVERTICULITIS-COLON; HTN (hypertension); Irritable bowel; GERD (gastroesophageal reflux disease); Unilateral primary osteoarthritis, right hip; Status post total replacement of right hip; CAD (coronary artery disease), native coronary artery; Metatarsalgia of right foot; DM (diabetes mellitus) (HCC); and Tinnitus aurium, left on their problem list.   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  [name] [name] is having cataract surgery on Monday.   History of Diabetes Mellitus, Type II treated with Semaglutide  2 mg injected weekly. 09/30/2024 HgbA1c 5.6%  History of Hypertension treated with Hydrochlorothiazide  25 mg, Olmesartan  20 mg daily. Blood pressure today was hypertensive at 142/80 and 130/86 on recheck.    History of Hyperlipidemia treated with Rosuvastatin  5 mg daily. 09/30/2024 Lipid panel normal.   History of GE Reflux treated with Protonix  40 mg daily.    History of irritable bowel syndrome treated with Xanax  0.5 mg three times daily as needed.   History of Diverticulitis.   History of Left AC joint inflammation, treated with Celebrex  100 mg daily.  History of mild depression, treated with Wellbutrin  150 mg daily.   History of nonobstructive CAD followed by Dr. Delford.  Had TTE February 2022 showing mild aortic stenosis with mean gradient of 10 mmHg.  [name] [name] was started on Imdur  and Toprol .  [name] [name] also had coronary catheterization by Dr. Claudene in April 2022 showing right dominance of the coronary arteries.  LAD  had luminal irregularities.  Overall widely patent coronary arteries without obstructive disease. Repeat echocardiogram 01/2023 showed LVEF 55-60%, grade 1 diastolic dysfunction, trivial mitral valve regurgitation, mild aortic valve stenosis.   In the fall 2019 [name] [name] suffered an injury to the right median nerve and sprained his left wrist when the horse [name] [name] was riding jump suddenly injuring his hand.  It took some time for him to improve and [name] [name] saw Dr. Kevin Kuzma.   [name] [name] had a right total hip arthroplasty in May 2020 by Dr. Vernetta.  Labs 09/30/2024 Blood glucose 105, Otherwise WNL.    03/25/2021 Coronary Calcium  score: 587.   02/07/2022 Colonoscopy The examined portion of the ileum was normal. Diverticulosis in the entire examined colon. Five 2 to 5 mm polyps in the descending colon, in the ascending colon and in the cecum.  Resected and retrieved. Pathology found them to be Benign but precancerous One 2 mm polyp in the ascending colon, removed with a cold biopsy forceps. Resected and retrieved. Non- bleeding internal hemorrhoids. Repeat in 3 years.    Vaccine counseling: UTD on Influenza vaccine.   Health Maintenance  Topic Date Due   Medicare Annual Wellness (AWV)  07/17/2024   Colonoscopy  02/07/2025   Zoster Vaccines- Shingrix (1 of 2) 12/03/2024 (Originally 09/13/1975)   Influenza Vaccine  03/25/2025 (Originally 07/26/2024)   FOOT EXAM  02/20/2025   HEMOGLOBIN A1C  03/31/2025   OPHTHALMOLOGY EXAM  05/01/2025   Diabetic kidney evaluation - eGFR measurement  09/30/2025   Diabetic kidney evaluation - Urine ACR  09/30/2025   DTaP/Tdap/Td (3 - Td or  Tdap) 05/19/2031   Pneumococcal Vaccine: 50+ Years  Completed   Meningococcal B Vaccine  Aged Out   COVID-19 Vaccine  Discontinued   Hepatitis C Screening  Discontinued    Review of Systems  Constitutional:  Negative for fever and malaise/fatigue.  HENT:  Negative for congestion.   Eyes:  Negative for blurred vision.  Respiratory:  Negative  for cough and shortness of breath.   Cardiovascular:  Negative for chest pain, palpitations and leg swelling.  Gastrointestinal:  Negative for vomiting.  Musculoskeletal:  Negative for back pain.  Skin:  Negative for rash.  Neurological:  Negative for loss of consciousness and headaches.   Objective:  Vitals: body mass index is 29.72 kg/m. Today's Vitals   10/21/24 1403  BP: (!) 142/80  Pulse: 67  Temp: 98.8 F (37.1 C)  TempSrc: Tympanic  SpO2: 96%  Weight: 213 lb 1.9 oz (96.7 kg)  Height: 5' 11 (1.803 m)   Physical Exam Vitals and nursing note reviewed. Exam conducted with a chaperone present Rheba Quiet, CMA).  Constitutional:      General: [name] [name] is awake. [name] [name] is not in acute distress.    Appearance: Normal appearance. [name] [name] is not ill-appearing or toxic-appearing.  HENT:     Head: Normocephalic and atraumatic.     Right Ear: Tympanic membrane, ear canal and external ear normal.     Left Ear: Tympanic membrane, ear canal and external ear normal.     Mouth/Throat:     Pharynx: Oropharynx is clear.  Eyes:     Extraocular Movements: Extraocular movements intact.     Pupils: Pupils are equal, round, and reactive to light.  Neck:     Thyroid: No thyroid mass, thyromegaly or thyroid tenderness.     Vascular: No carotid bruit.  Cardiovascular:     Rate and Rhythm: Normal rate and regular rhythm. No extrasystoles are present.    Pulses:          Dorsalis pedis pulses are 2+ on the right side and 2+ on the left side.       Posterior tibial pulses are 2+ on the right side and 2+ on the left side.     Heart sounds: Normal heart sounds. No murmur heard.    No friction rub. No gallop.  Pulmonary:     Effort: Pulmonary effort is normal.     Breath sounds: Normal breath sounds. No decreased breath sounds, wheezing, rhonchi or rales.  Chest:     Chest wall: No mass.  Abdominal:     Palpations: Abdomen is soft. There is no hepatomegaly, splenomegaly or mass.     Tenderness:  There is no abdominal tenderness.     Hernia: No hernia is present.  Genitourinary:    Prostate: Normal. Not enlarged, not tender and no nodules present.     Rectum: Normal. Guaiac result negative.  Musculoskeletal:     Cervical back: Normal range of motion.     Right lower leg: No edema.     Left lower leg: No edema.  Feet:     Comments: Sensation intact.  Lymphadenopathy:     Cervical: No cervical adenopathy.     Upper Body:     Right upper body: No supraclavicular adenopathy.     Left upper body: No supraclavicular adenopathy.  Skin:    General: Skin is warm and dry.  Neurological:     General: No focal deficit present.     Mental Status: [name] [name] is alert and oriented to  person, place, and time. Mental status is at baseline.     Cranial Nerves: Cranial nerves 2-12 are intact.     Sensory: Sensation is intact.     Motor: Motor function is intact.     Coordination: Coordination is intact.     Gait: Gait is intact.     Deep Tendon Reflexes: Reflexes are normal and symmetric.  Psychiatric:        Attention and Perception: Attention normal.        Mood and Affect: Mood normal.        Speech: Speech normal.        Behavior: Behavior normal. Behavior is cooperative.        Thought Content: Thought content normal.        Cognition and Memory: Cognition and memory normal.        Judgment: Judgment normal.     Current Outpatient Medications  Medication Instructions   Accu-Chek FastClix Lancets MISC USE TO TEST BLOOD GLUCOSE BEFORE BREAKFAST AND SUPPER   ACCU-CHEK GUIDE test strip USE TO TEST BLOOD GLUCOSE BEFORE BREAKFAST AND SUPPER   ALPRAZolam  (XANAX ) 0.5 mg, Oral, 3 times daily   aspirin  EC 81 mg, Daily   buPROPion  (WELLBUTRIN  XL) 150 mg, Oral, Daily   celecoxib  (CELEBREX ) 200 mg, Oral, 2 times daily   diphenhydrAMINE  (BENADRYL ) 50 MG tablet Pt is also to take 50 mg of benadryl  on 09/03/2024 at 8:10 am. Please call [phone] with any questions.   hydrochlorothiazide   (HYDRODIURIL ) 25 MG tablet TAKE ONE TABLET BY MOUTH ONE TIME DAILY WITH LOSARTAN    hydrocortisone  (PROCTOZONE -HC) 2.5 % rectal cream 1 Application, Rectal, 2 times daily   olmesartan  (BENICAR ) 20 mg, Oral, Daily   ondansetron  (ZOFRAN ) 4 mg, Oral, Every 8 hours PRN   pantoprazole  (PROTONIX ) 40 mg, Oral, Daily   predniSONE  (DELTASONE ) 50 MG tablet Pt to take 50 mg of prednisone  on 09/02/2024 at 8:10 pm, 50 mg of prednisone  on 09/03/2024 at 2:10 am, and 50 mg of prednisone  on 09/03/2024 at 8:10 am. Pt is also to take 50 mg of benadryl  on 09/03/2024 at 8:10 am. Please call [phone] with any questions.   promethazine  (PHENERGAN ) 25 mg, Oral, Every 8 hours PRN   rosuvastatin  (CRESTOR ) 5 mg, Oral, Daily   Semaglutide  (2 MG/DOSE) 2 mg, Injection, Weekly   sildenafil  (VIAGRA ) 100 MG tablet TAKE ONE-HALF TO ONE TABLET BY MOUTH ONE TIME DAILY AS NEEDED ERECTILE DYSFUNCTION.   tadalafil  (CIALIS ) 20 MG tablet TAKE ONE-HALF TO ONE TABLET BY MOUTH EVERY OTHER DAY AS NEEDED FOR ERECTILE DYSFUNCTION   Past Medical History:  Diagnosis Date   Anxiety    mild- past  hx   Arthritis    Rt hip   Cancer (HCC) 04/2019   Skin Rt hand. Dr. Joshua   Diabetes mellitus without complication (HCC)    GERD (gastroesophageal reflux disease)    Tums or omeprozole   Heart murmur    History of IBS    History of kidney stones    30 years ago   Hypertension 2009   Meds controlled   Medical/Surgical History Narrative:  Allergic/Intolerant to:  Allergies  Allergen Reactions   Iohexol       Code: RASH, Desc: PER Vinayak Bobier SILER (TECH @ [facility])STATES PT HAD REACTION TO CONTRAST MANY YRS PRIOR. 01/05/09/RM, Onset Date: [id number]    Miralax  [Polyethylene Glycol] Other (See Comments)    Caused severe abdominal pain with bowel prep ( 238 grams )    Shellfish Allergy  Nausea And Vomiting     Past Surgical History:  Procedure Laterality Date   COLONOSCOPY  2005   has IBS   LEFT HEART CATH AND CORONARY ANGIOGRAPHY N/A 04/12/2021    Procedure: LEFT HEART CATH AND CORONARY ANGIOGRAPHY;  Surgeon: Claudene Victory ORN, MD;  Location: [facility];  Service: Cardiovascular;  Laterality: N/A;   renal stone     TOTAL HIP ARTHROPLASTY Right 05/10/2019   Procedure: RIGHT TOTAL HIP ARTHROPLASTY ANTERIOR APPROACH;  Surgeon: Vernetta Lonni GRADE, MD;  Location: [facility];  Service: Orthopedics;  Laterality: Right;   Family History  Problem Relation Age of Onset   Breast cancer Mother    Prostate cancer Father    Cancer Father    Colon cancer Neg Hx    Colon polyps Neg Hx    Esophageal cancer Neg Hx    Stomach cancer Neg Hx    Rectal cancer Neg Hx    Family History Narrative: Father died with complications of prostate cancer. Mother died with complications of dementia. 1 brother.     Social History Narrative:   Non-smoker.  Social alcohol consumption.  May have 1-2 drinks of Vodka. Married.  Wife retired from [employer].  [name] [name] has 1 adult son from previous marriage.  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 [name] [name] continues to enjoy horse riding.   Most Recent Health Risks Assessment:   Medicare Risk at Home - 10/21/24 1358     Any stairs in or around the home? Yes    If so, are there any without handrails? Yes    Home free of loose throw rugs in walkways, pet beds, electrical cords, etc? Yes    Adequate lighting in your home to reduce risk of falls? Yes    Life alert? No    Use of a cane, walker or w/c? No    Grab bars in the bathroom? No    Shower chair or bench in shower? Yes    Elevated toilet seat or a handicapped toilet? No         Most Recent Social Determinants of Health (Including Hx of Tobacco, Alcohol, and Drug Use) SDOH Screenings   Food Insecurity: Patient Declined (10/17/2024)  Housing: Patient Declined (10/17/2024)  Transportation Needs: Patient Declined (10/17/2024)  Alcohol Screen: Low Risk  (01/20/2024)  Depression (PHQ2-9): Low Risk  (10/21/2024)  Financial Resource  Strain: Patient Declined (10/17/2024)  Physical Activity: Unknown (10/17/2024)  Social Connections: Unknown (10/17/2024)  Stress: Patient Declined (10/17/2024)  Tobacco Use: Low Risk  (09/03/2024)   Social History   Tobacco Use   Smoking status: Never    Passive exposure: Never   Smokeless tobacco: Never  Vaping Use   Vaping status: Never Used  Substance Use Topics   Alcohol use: Yes    Alcohol/week: 2.0 - 3.0 standard drinks of alcohol    Types: 2 - 3 Shots of liquor per week    Comment: occ   Drug use: No   Most Recent Functional Status Assessment:    10/21/2024    1:57 PM  In your present state of health, do you have any difficulty performing the following activities:  Hearing? 1  Vision? 0  Difficulty concentrating or making decisions? 0  Walking or climbing stairs? 0  Dressing or bathing? 0  Doing errands, shopping? 0  Preparing Food and eating ? N  In the past six months, have you accidently leaked urine? N  Do you have problems with  loss of bowel control? N  Managing your Medications? N  Managing your Finances? N  Housekeeping or managing your Housekeeping? N   Most Recent Fall Risk Assessment:    10/21/2024    2:03 PM  Fall Risk   Falls in the past year? 0  Number falls in past yr: 0  Injury with Fall? 0  Risk for fall due to : No Fall Risks  Follow up Falls evaluation completed   Most Recent Anxiety/Depression Screenings:    10/21/2024    1:59 PM 09/03/2024    9:38 AM  PHQ 2/9 Scores  PHQ - 2 Score 0 0      10/21/2024    2:06 PM  GAD 7 : Generalized Anxiety Score  Nervous, Anxious, on Edge 0  Control/stop worrying 0  Worry too much - different things 0  Trouble relaxing 0  Restless 0  Easily annoyed or irritable 0  Afraid - awful might happen 0  Total GAD 7 Score 0   Most Recent Cognitive Screening:    10/21/2024    2:00 PM  6CIT Screen  What Year? 0 points  What month? 0 points  What time? 0 points  Count back from 20 0 points   Months in reverse 0 points  Repeat phrase 0 points  Total Score 0 points   Most Recent Vision/Hearing Screenings: Hearing Screening   250Hz  500Hz  1000Hz  3000Hz   Right ear Pass Pass Pass Pass  Left ear Pass Pass Pass Pass   Vision Screening   Right eye Left eye Both eyes  Without correction     With correction 20/20 20/20 20/20    Results:  Studies Obtained And Personally Reviewed By Me:  03/25/2021 Coronary Calcium  score: 587.   02/07/2022 Colonoscopy The examined portion of the ileum was normal. Diverticulosis in the entire examined colon. Five 2 to 5 mm polyps in the descending colon, in the ascending colon and in the cecum.  Resected and retrieved. Pathology found them to be Benign but precancerous One 2 mm polyp in the ascending colon, removed with a cold biopsy forceps. Resected and retrieved. Non- bleeding internal hemorrhoids. Repeat in 3 years.    Labs:  CBC w/ Differential Lab Results  Component Value Date   WBC 4.5 09/30/2024   RBC 4.52 09/30/2024   HGB 14.7 09/30/2024   HCT 42.4 09/30/2024   PLT 168 09/30/2024   MCV 93.8 09/30/2024   MCH 32.5 09/30/2024   MCHC 34.7 09/30/2024   RDW 13.0 09/30/2024   MPV 8.9 09/30/2024   LYMPHSABS 1,874 07/17/2023   MONOABS 495 04/14/2017   BASOSABS 32 09/30/2024    Comprehensive Metabolic Panel Lab Results  Component Value Date   NA 139 09/30/2024   K 3.6 09/30/2024   CL 102 09/30/2024   CO2 28 09/30/2024   GLUCOSE 105 (H) 09/30/2024   BUN 15 09/30/2024   CREATININE 1.05 09/30/2024   CALCIUM  9.4 09/30/2024   PROT 7.0 09/30/2024   ALBUMIN 4.2 08/16/2016   AST 24 09/30/2024   ALT 27 09/30/2024   ALKPHOS 44 08/16/2016   BILITOT 0.7 09/30/2024   EGFR 76 09/30/2024   GFRNONAA 89 05/17/2021   Lipid Panel  Lab Results  Component Value Date   CHOL 132 09/30/2024   HDL 53 09/30/2024   LDLCALC 58 09/30/2024   TRIG 133 09/30/2024   A1c Lab Results  Component Value Date   HGBA1C 5.6 09/30/2024     PSA  09/30/2024 1.69.  Assessment & Plan:  Diabetes Mellitus, Type II: treated with Semaglutide  2 mg injected weekly. 09/30/2024 HgbA1c 5.6%  Hypertension: treated with Hydrochlorothiazide  25 mg, Olmesartan  20 mg daily. Blood pressure today was hypertensive at 142/80 and 130/86 on recheck.    Hyperlipidemia: treated with Rosuvastatin  5 mg daily. 09/30/2024 Lipid panel normal.   GE Reflux: treated with Protonix  40 mg daily.    Irritable bowel syndrome: treated with Xanax  0.5 mg three times daily as needed.   Left AC joint inflammation: treated with Celebrex  100 mg daily.  Mild depression: treated with Wellbutrin  150 mg daily.   Nonobstructive CAD: followed by Dr. Delford.  Had TTE February 2022 showing mild aortic stenosis with mean gradient of 10 mmHg.  [name] [name] was started on Imdur  and Toprol .  [name] [name] also had coronary catheterization by Dr. Claudene in April 2022 showing right dominance of the coronary arteries.  LAD had luminal irregularities.  Overall widely patent coronary arteries without obstructive disease. Repeat echocardiogram 01/2023 showed LVEF 55-60%, grade 1 diastolic dysfunction, trivial mitral valve regurgitation, mild aortic valve stenosis.  03/25/2021 Coronary Calcium  score: 587.   02/07/2022 Colonoscopy The examined portion of the ileum was normal. Diverticulosis in the entire examined colon. Five 2 to 5 mm polyps in the descending colon, in the ascending colon and in the cecum.  Resected and retrieved. Pathology found them to be Benign but precancerous One 2 mm polyp in the ascending colon, removed with a cold biopsy forceps. Resected and retrieved. Non- bleeding internal hemorrhoids. Repeat in 3 years.    Vaccine counseling: UTD on Influenza vaccine.   Return in one year or as needed.    Annual Wellness Visit done today including the all of the following: Reviewed patient's Family Medical History Reviewed patient's SDOH and reviewed tobacco, alcohol, and drug use.  Reviewed and  updated list of patient's medical providers Assessment of cognitive impairment was done Assessed patient's functional ability Established a written schedule for health screening services Health Risk Assessent Completed and Reviewed  Discussed health benefits of physical activity, and encouraged him to engage in regular exercise appropriate for his age and condition.   I,Makayla C Reid,acting as a scribe for [name] JINNY Hailstone, MD.,have documented all relevant documentation on the behalf of [name] JINNY Hailstone, MD,as directed by  [name] JINNY Hailstone, MD while in the presence of [name] JINNY Hailstone, MD.   I, [name] JINNY Hailstone, MD, have reviewed all documentation for and agree with the above Annual Wellness Visit documentation.  [name] JINNY Hailstone, MD Internal Medicine 10/21/2024

## 2024-10-21 ENCOUNTER — Ambulatory Visit (INDEPENDENT_AMBULATORY_CARE_PROVIDER_SITE_OTHER): Admitting: Internal Medicine

## 2024-10-21 VITALS — BP 142/80 | HR 67 | Temp 98.8°F | Ht 71.0 in | Wt 213.1 lb

## 2024-10-21 DIAGNOSIS — I10 Essential (primary) hypertension: Secondary | ICD-10-CM | POA: Diagnosis not present

## 2024-10-21 DIAGNOSIS — K580 Irritable bowel syndrome with diarrhea: Secondary | ICD-10-CM

## 2024-10-21 DIAGNOSIS — K2100 Gastro-esophageal reflux disease with esophagitis, without bleeding: Secondary | ICD-10-CM

## 2024-10-21 DIAGNOSIS — E785 Hyperlipidemia, unspecified: Secondary | ICD-10-CM | POA: Diagnosis not present

## 2024-10-21 DIAGNOSIS — M25512 Pain in left shoulder: Secondary | ICD-10-CM

## 2024-10-21 DIAGNOSIS — Z8659 Personal history of other mental and behavioral disorders: Secondary | ICD-10-CM

## 2024-10-21 DIAGNOSIS — E119 Type 2 diabetes mellitus without complications: Secondary | ICD-10-CM | POA: Diagnosis not present

## 2024-10-21 DIAGNOSIS — Z125 Encounter for screening for malignant neoplasm of prostate: Secondary | ICD-10-CM

## 2024-10-21 DIAGNOSIS — Z8719 Personal history of other diseases of the digestive system: Secondary | ICD-10-CM

## 2024-10-21 DIAGNOSIS — K589 Irritable bowel syndrome without diarrhea: Secondary | ICD-10-CM | POA: Diagnosis not present

## 2024-10-21 DIAGNOSIS — Z0000 Encounter for general adult medical examination without abnormal findings: Secondary | ICD-10-CM

## 2024-10-21 DIAGNOSIS — E1169 Type 2 diabetes mellitus with other specified complication: Secondary | ICD-10-CM

## 2024-10-21 DIAGNOSIS — Z7985 Long-term (current) use of injectable non-insulin antidiabetic drugs: Secondary | ICD-10-CM

## 2024-10-21 DIAGNOSIS — Z96641 Presence of right artificial hip joint: Secondary | ICD-10-CM

## 2024-10-21 DIAGNOSIS — I350 Nonrheumatic aortic (valve) stenosis: Secondary | ICD-10-CM

## 2024-10-21 DIAGNOSIS — Z860101 Personal history of adenomatous and serrated colon polyps: Secondary | ICD-10-CM

## 2024-10-21 DIAGNOSIS — I2510 Atherosclerotic heart disease of native coronary artery without angina pectoris: Secondary | ICD-10-CM

## 2024-10-21 LAB — HEMOCCULT GUIAC POC 1CARD (OFFICE): Fecal Occult Blood, POC: NEGATIVE

## 2024-10-22 NOTE — Progress Notes (Unsigned)
 [name] [address] [place] [address] Phone: [phone] Subjective:    I'm seeing this patient by the request  of:  Perri Ronal PARAS, MD  CC: Neck pain  YEP:Dlagzrupcz  08/13/2024  Seems to be potentially vascular in nature, seems to be worse with different types of movement.  Patient seems to be more left-sided than right-sided.  It can be associated with some neck pain.  Do feel MRI is necessary with patient seeing other providers without any findings at the moment.  Affecting daily activities, making patient as well as wife more nervous.  Attempted osteopathic manipulation but do not think that this is going to make a significant difference in this individual and think it is more vascular in nature.  Follow-up with me again after imaging to discuss further treatment options.  Has had allergy to contrast previously but has pretreated with Benadryl  in the past and done fine.   We discussed different medications but once again with the positional changes in the intensity I do feel that there is something else that could be potentially contributing including vascular, neurogenic, lesion.     Update 10/23/2024 [name] [name] is a 68 y.o. male coming in with complaint of ringing in ears and MRI results   MRI Brain 08/27/2024  IMPRESSION: 1. No acute intracranial abnormality. 2. No abnormal enhancement. 3. No structural lesion to suggest etiology of tinnitus. 4. Few scattered foci of white matter signal abnormality, favor mild chronicmicrovascular ischemic changes.  MRI Cervical spine 08/27/2024 IMPRESSION: 1. Multilevel cervical spondylosis with resultant diffuse spinal stenosis at C3-4 through C6-7, moderate to severe in nature at C3-4 and C4-5. 2. Multifactorial degenerative changes with resultant multilevel foraminal narrowing as above. Notable findings include severe right with moderate left C4 foraminal stenosis, severe left C5 foraminal  narrowing, severe left with moderate right C6 foraminal stenosis, with moderate bilateral C7 foraminal narrowing. 3. Mild reactive marrow edema about the right C5-6 facet due to facet arthritis. Finding could serve as a source for neck pain.   We discussed the possibility of an epidural which patient has not responded to.  Past Medical History:  Diagnosis Date   Anxiety    mild- past  hx   Arthritis    Rt hip   Cancer (HCC) 04/2019   Skin Rt hand. Dr. Joshua   Diabetes mellitus without complication (HCC)    GERD (gastroesophageal reflux disease)    Tums or omeprozole   Heart murmur    History of IBS    History of kidney stones    30 years ago   Hypertension 2009   Meds controlled   Past Surgical History:  Procedure Laterality Date   COLONOSCOPY  2005   has IBS   LEFT HEART CATH AND CORONARY ANGIOGRAPHY N/A 04/12/2021   Procedure: LEFT HEART CATH AND CORONARY ANGIOGRAPHY;  Surgeon: Claudene Victory ORN, MD;  Location: [facility];  Service: Cardiovascular;  Laterality: N/A;   renal stone     TOTAL HIP ARTHROPLASTY Right 05/10/2019   Procedure: RIGHT TOTAL HIP ARTHROPLASTY ANTERIOR APPROACH;  Surgeon: Vernetta Lonni GRADE, MD;  Location: [facility];  Service: Orthopedics;  Laterality: Right;   Social History   Socioeconomic History   Marital status: Married    Spouse name: Not on file   Number of children: Not on file   Years of education: Not on file   Highest education level: 12th grade  Occupational History   Not on  file  Tobacco Use   Smoking status: Never    Passive exposure: Never   Smokeless tobacco: Never  Vaping Use   Vaping status: Never Used  Substance and Sexual Activity   Alcohol use: Yes    Alcohol/week: 2.0 - 3.0 standard drinks of alcohol    Types: 2 - 3 Shots of liquor per week    Comment: occ   Drug use: No   Sexual activity: Yes  Other Topics Concern   Not on file  Social History Narrative   Not on file   Social Drivers of Health    Financial Resource Strain: Patient Declined (10/17/2024)   Overall Financial Resource Strain (CARDIA)    Difficulty of Paying Living Expenses: Patient declined  Food Insecurity: Patient Declined (10/17/2024)   Hunger Vital Sign    Worried About Running Out of Food in the Last Year: Patient declined    Ran Out of Food in the Last Year: Patient declined  Transportation Needs: Patient Declined (10/17/2024)   PRAPARE - Administrator, Civil Service (Medical): Patient declined    Lack of Transportation (Non-Medical): Patient declined  Physical Activity: Unknown (10/17/2024)   Exercise Vital Sign    Days of Exercise per Week: Patient declined    Minutes of Exercise per Session: Not on file  Stress: Patient Declined (10/17/2024)   [employer] of Occupational Health - Occupational Stress Questionnaire    Feeling of Stress: Patient declined  Social Connections: Unknown (10/17/2024)   Social Connection and Isolation Panel    Frequency of Communication with Friends and Family: Patient declined    Frequency of Social Gatherings with Friends and Family: Patient declined    Attends Religious Services: Patient declined    Database Administrator or Organizations: Patient declined    Attends Engineer, Structural: Not on file    Marital Status: Patient declined   Allergies  Allergen Reactions   Iohexol       Code: RASH, Desc: PER MARY SILER (TECH @ [facility])STATES PT HAD REACTION TO CONTRAST MANY YRS PRIOR. 01/05/09/RM, Onset Date: 88867992    Miralax  [Polyethylene Glycol] Other (See Comments)    Caused severe abdominal pain with bowel prep ( 238 grams )    Shellfish Allergy Nausea And Vomiting   Family History  Problem Relation Age of Onset   Breast cancer Mother    Prostate cancer Father    Cancer Father    Colon cancer Neg Hx    Colon polyps Neg Hx    Esophageal cancer Neg Hx    Stomach cancer Neg Hx    Rectal cancer Neg Hx     Current Outpatient Medications  (Endocrine & Metabolic):    predniSONE  (DELTASONE ) 50 MG tablet, Pt to take 50 mg of prednisone  on 09/02/2024 at 8:10 pm, 50 mg of prednisone  on 09/03/2024 at 2:10 am, and 50 mg of prednisone  on 09/03/2024 at 8:10 am. Pt is also to take 50 mg of benadryl  on 09/03/2024 at 8:10 am. Please call [phone] with any questions.   Semaglutide , 2 MG/DOSE, 8 MG/3ML SOPN, Inject 2 mg as directed once a week.  Current Outpatient Medications (Cardiovascular):    hydrochlorothiazide  (HYDRODIURIL ) 25 MG tablet, TAKE ONE TABLET BY MOUTH ONE TIME DAILY WITH LOSARTAN    olmesartan  (BENICAR ) 20 MG tablet, TAKE ONE TABLET BY MOUTH ONE TIME DAILY   rosuvastatin  (CRESTOR ) 5 MG tablet, TAKE ONE TABLET BY MOUTH ONE TIME DAILY   sildenafil  (VIAGRA ) 100 MG tablet, TAKE ONE-HALF TO  ONE TABLET BY MOUTH ONE TIME DAILY AS NEEDED ERECTILE DYSFUNCTION.   tadalafil  (CIALIS ) 20 MG tablet, TAKE ONE-HALF TO ONE TABLET BY MOUTH EVERY OTHER DAY AS NEEDED FOR ERECTILE DYSFUNCTION  Current Outpatient Medications (Respiratory):    diphenhydrAMINE  (BENADRYL ) 50 MG tablet, Pt is also to take 50 mg of benadryl  on 09/03/2024 at 8:10 am. Please call [phone] with any questions.   promethazine  (PHENERGAN ) 25 MG tablet, Take 1 tablet (25 mg total) by mouth every 8 (eight) hours as needed for nausea or vomiting.  Current Outpatient Medications (Analgesics):    aspirin  EC 81 MG tablet, Take 81 mg by mouth daily. Swallow whole.   celecoxib  (CELEBREX ) 200 MG capsule, TAKE ONE CAPSULE BY MOUTH TWICE A DAY   Current Outpatient Medications (Other):    Accu-Chek FastClix Lancets MISC, USE TO TEST BLOOD GLUCOSE BEFORE BREAKFAST AND SUPPER   ACCU-CHEK GUIDE test strip, USE TO TEST BLOOD GLUCOSE BEFORE BREAKFAST AND SUPPER   ALPRAZolam  (XANAX ) 0.5 MG tablet, TAKE ONE TABLET BY MOUTH THREE TIMES A DAY   buPROPion  (WELLBUTRIN  XL) 150 MG 24 hr tablet, TAKE ONE TABLET BY MOUTH ONE TIME DAILY   hydrocortisone  (PROCTOZONE -HC) 2.5 % rectal cream, Place 1  Application rectally 2 (two) times daily.   ondansetron  (ZOFRAN ) 4 MG tablet, Take 1 tablet (4 mg total) by mouth every 8 (eight) hours as needed for nausea or vomiting.   pantoprazole  (PROTONIX ) 40 MG tablet, TAKE ONE TABLET BY MOUTH ONE TIME DAILY   Reviewed prior external information including notes and imaging from  primary care provider As well as notes that were available from care everywhere and other healthcare systems.  Past medical history, social, surgical and family history all reviewed in electronic medical record.  No pertanent information unless stated regarding to the chief complaint.   Review of Systems:  No headache, visual changes, nausea, vomiting, diarrhea, constipation, dizziness, abdominal pain, skin rash, fevers, chills, night sweats, weight loss, swollen lymph nodes, body aches, joint swelling, chest pain, shortness of breath, mood changes. POSITIVE muscle aches  Objective  There were no vitals taken for this visit.   General: No apparent distress alert and oriented x3 mood and affect normal, dressed appropriately.  HEENT: Pupils equal, extraocular movements intact  Respiratory: Patient's speak in full sentences and does not appear short of breath  Cardiovascular: No lower extremity edema, non tender, no erythema  Severe loss of lordosis noted.  Patient does have difficulty with flexion and extension of the neck noted.    Impression and Recommendations:    The above documentation has been reviewed and is accurate and complete Lataunya Ruud M Ronalda Walpole, DO

## 2024-10-23 ENCOUNTER — Ambulatory Visit (INDEPENDENT_AMBULATORY_CARE_PROVIDER_SITE_OTHER): Admitting: Family Medicine

## 2024-10-23 ENCOUNTER — Ambulatory Visit

## 2024-10-23 VITALS — BP 118/84 | HR 83 | Ht 71.0 in | Wt 209.0 lb

## 2024-10-23 DIAGNOSIS — R0602 Shortness of breath: Secondary | ICD-10-CM

## 2024-10-23 DIAGNOSIS — H9313 Tinnitus, bilateral: Secondary | ICD-10-CM | POA: Diagnosis not present

## 2024-10-23 DIAGNOSIS — M4802 Spinal stenosis, cervical region: Secondary | ICD-10-CM | POA: Diagnosis not present

## 2024-10-23 NOTE — Patient Instructions (Addendum)
 Epidural C7-T1  Chest xray  2-3 month follow up

## 2024-10-23 NOTE — Assessment & Plan Note (Signed)
 Severe overall, patient may then have some weakness noted in the hands already with some constant numbness on the left side.  Do feel that an epidural would be helpful diagnostically as well as therapeutically.  I do think that there is a chance patient may need some surgical intervention which patient wants to avoid.  Medication I think that would be helpful could be potential gabapentin.  Patient would like to hold off on the medications at the moment though.  Will try the epidural.  Discussed with patient that icing regimen and home exercises, discussed which activities to do and which ones to avoid.  Increase activity slowly.  Discussed avoiding certain activities.  Follow-up again in 6 to 12 weeks.

## 2024-10-24 ENCOUNTER — Encounter: Payer: Self-pay | Admitting: Internal Medicine

## 2024-10-24 NOTE — Patient Instructions (Addendum)
 It was a pleasure to see you today.. Continue current medications. Continue diet and exercise efforts. Flu vaccine is up to date. Return in one year or as needed. Colonoscopy is up to date. PSA is normal.Continue to see cardiologist yearly.

## 2024-10-25 ENCOUNTER — Ambulatory Visit: Payer: Self-pay | Admitting: Family Medicine

## 2024-11-27 ENCOUNTER — Telehealth: Payer: Self-pay

## 2024-11-27 MED ORDER — PREDNISONE 50 MG PO TABS: ORAL_TABLET | ORAL | 0 refills | Status: AC

## 2024-11-27 NOTE — Progress Notes (Signed)
 Pt to take 50 mg of prednisone  on 11/27/24 at 8:30 pm, 50 mg of prednisone  on 11/28/2024 at 2:30 am, and 50 mg of prednisone  on 11/28/2024 at 8:30 am. Pt is also to take 50 mg of benadryl  on 11/28/2024 at 8:30 am.  Pt verbalized understanding. Note to pharmacy to fill this morning

## 2024-11-27 NOTE — Discharge Instructions (Signed)

## 2024-11-28 ENCOUNTER — Telehealth: Payer: Self-pay

## 2024-11-28 ENCOUNTER — Inpatient Hospital Stay
Admission: RE | Admit: 2024-11-28 | Discharge: 2024-11-28 | Disposition: A | Source: Ambulatory Visit | Attending: Family Medicine | Admitting: Family Medicine

## 2024-12-02 NOTE — Progress Notes (Signed)
 Scan moved to 12/9. 13 hour prep reordered and clarified with both pt an pharmacy. Pt aware of time 2030 (12/8), 12/9: 0230 and 0830.

## 2024-12-02 NOTE — Discharge Instructions (Signed)

## 2024-12-03 ENCOUNTER — Inpatient Hospital Stay
Admission: RE | Admit: 2024-12-03 | Discharge: 2024-12-03 | Disposition: A | Source: Ambulatory Visit | Attending: Family Medicine | Admitting: Family Medicine

## 2024-12-03 DIAGNOSIS — H9313 Tinnitus, bilateral: Secondary | ICD-10-CM

## 2024-12-03 MED ORDER — TRIAMCINOLONE ACETONIDE 40 MG/ML IJ SUSP (RADIOLOGY)
60.0000 mg | Freq: Once | INTRAMUSCULAR | Status: AC
  Administered 2024-12-03: 60 mg via EPIDURAL

## 2024-12-03 MED ORDER — IOPAMIDOL (ISOVUE-M 300) INJECTION 61%
1.0000 mL | Freq: Once | INTRAMUSCULAR | Status: AC | PRN
  Administered 2024-12-03: 1 mL via EPIDURAL

## 2024-12-06 ENCOUNTER — Other Ambulatory Visit: Payer: Self-pay | Admitting: Internal Medicine

## 2024-12-06 NOTE — Telephone Encounter (Signed)
 done

## 2024-12-18 ENCOUNTER — Other Ambulatory Visit: Payer: Self-pay | Admitting: Internal Medicine

## 2024-12-25 NOTE — Progress Notes (Signed)
 " [facility] [address] [place] [address] Phone: [phone] Subjective:   [name], am serving as a scribe for Dr. Arthea Claudene.  I'm seeing this patient by the request  of:  Baxley, Mary J, MD  CC: Neck pain follow-up  YEP:Dlagzrupcz  10/23/2024 Severe overall, patient may then have some weakness noted in the hands already with some constant numbness on the left side. Do feel that an epidural would be helpful diagnostically as well as therapeutically. I do think that there is a chance patient may need some surgical intervention which patient wants to avoid. Medication I think that would be helpful could be potential gabapentin . Patient would like to hold off on the medications at the moment though. Will try the epidural. Discussed with patient that icing regimen and home exercises, discussed which activities to do and which ones to avoid. Increase activity slowly. Discussed avoiding certain activities. Follow-up again in 6 to 12 weeks.   Update 01/01/2024 TYLER CUBIT is a 68 y.o. male coming in with complaint of cervical spine pain.  Known cervical spinal stenosis.  Epidural of the neck done December 9.  Patient states that he had relief for about 3 weeks. Tinnitus also reduced after epidural.       Past Medical History:  Diagnosis Date   Anxiety    mild- past  hx   Arthritis    Rt hip   Cancer (HCC) 04/2019   Skin Rt hand. Dr. Joshua   Diabetes mellitus without complication (HCC)    GERD (gastroesophageal reflux disease)    Tums or omeprozole   Heart murmur    History of IBS    History of kidney stones    30 years ago   Hypertension 2009   Meds controlled   Past Surgical History:  Procedure Laterality Date   COLONOSCOPY  2005   has IBS   LEFT HEART CATH AND CORONARY ANGIOGRAPHY N/A 04/12/2021   Procedure: LEFT HEART CATH AND CORONARY ANGIOGRAPHY;  Surgeon: Claudene Victory ORN, MD;  Location: [facility];  Service:  Cardiovascular;  Laterality: N/A;   renal stone     TOTAL HIP ARTHROPLASTY Right 05/10/2019   Procedure: RIGHT TOTAL HIP ARTHROPLASTY ANTERIOR APPROACH;  Surgeon: Vernetta Lonni GRADE, MD;  Location: [facility];  Service: Orthopedics;  Laterality: Right;   Social History   Socioeconomic History   Marital status: Married    Spouse name: Not on file   Number of children: Not on file   Years of education: Not on file   Highest education level: 12th grade  Occupational History   Not on file  Tobacco Use   Smoking status: Never    Passive exposure: Never   Smokeless tobacco: Never  Vaping Use   Vaping status: Never Used  Substance and Sexual Activity   Alcohol use: Yes    Alcohol/week: 2.0 - 3.0 standard drinks of alcohol    Types: 2 - 3 Shots of liquor per week    Comment: occ   Drug use: No   Sexual activity: Yes  Other Topics Concern   Not on file  Social History Narrative   Not on file   Social Drivers of Health   Tobacco Use: Low Risk (10/24/2024)   Patient History    Smoking Tobacco Use: Never    Smokeless Tobacco Use: Never    Passive Exposure: Never  Financial Resource Strain: Patient Declined (10/17/2024)   Overall Physicist, Medical  Strain (CARDIA)    Difficulty of Paying Living Expenses: Patient declined  Food Insecurity: Patient Declined (10/17/2024)   Epic    Worried About Programme Researcher, Broadcasting/film/video in the Last Year: Patient declined    Barista in the Last Year: Patient declined  Transportation Needs: Patient Declined (10/17/2024)   Epic    Lack of Transportation (Medical): Patient declined    Lack of Transportation (Non-Medical): Patient declined  Physical Activity: Unknown (10/17/2024)   Exercise Vital Sign    Days of Exercise per Week: Patient declined    Minutes of Exercise per Session: Not on file  Stress: Patient Declined (10/17/2024)   [employer] of Occupational Health - Occupational Stress Questionnaire    Feeling of Stress: Patient  declined  Social Connections: Unknown (10/17/2024)   Social Connection and Isolation Panel    Frequency of Communication with Friends and Family: Patient declined    Frequency of Social Gatherings with Friends and Family: Patient declined    Attends Religious Services: Patient declined    Active Member of Clubs or Organizations: Patient declined    Attends Banker Meetings: Not on file    Marital Status: Patient declined  Depression (PHQ2-9): Low Risk (10/21/2024)   Depression (PHQ2-9)    PHQ-2 Score: 0  Alcohol Screen: Low Risk (01/20/2024)   Alcohol Screen    Last Alcohol Screening Score (AUDIT): 2  Housing: Patient Declined (10/17/2024)   Epic    Unable to Pay for Housing in the Last Year: Patient declined    Number of Times Moved in the Last Year: Not on file    Homeless in the Last Year: Patient declined  Utilities: Not on file  Health Literacy: Not on file   Allergies[1] Family History  Problem Relation Age of Onset   Breast cancer Mother    Prostate cancer Father    Cancer Father    Colon cancer Neg Hx    Colon polyps Neg Hx    Esophageal cancer Neg Hx    Stomach cancer Neg Hx    Rectal cancer Neg Hx     Current Outpatient Medications (Endocrine & Metabolic):    predniSONE  (DELTASONE ) 50 MG tablet, Pt to take 50 mg of prednisone  on 11/27/24 at 8:30 pm, 50 mg of prednisone  on 11/28/2024 at 2:30 am, and 50 mg of prednisone  on 11/28/2024 at 8:30 am. Pt is also to take 50 mg of benadryl  on 11/28/2024 at 8:30 am. Please call [phone] with any questions.   Semaglutide , 2 MG/DOSE, 8 MG/3ML SOPN, Inject 2 mg as directed once a week.  Current Outpatient Medications (Cardiovascular):    hydrochlorothiazide  (HYDRODIURIL ) 25 MG tablet, TAKE ONE TABLET BY MOUTH ONE TIME DAILY WITH LOSARTAN    olmesartan  (BENICAR ) 20 MG tablet, TAKE ONE TABLET BY MOUTH ONE TIME DAILY   rosuvastatin  (CRESTOR ) 5 MG tablet, TAKE ONE TABLET BY MOUTH ONE TIME DAILY   sildenafil  (VIAGRA )  100 MG tablet, TAKE ONE-HALF TO ONE TABLET BY MOUTH ONE TIME DAILY AS NEEDED ERECTILE DYSFUNCTION.   tadalafil  (CIALIS ) 20 MG tablet, TAKE ONE-HALF TO ONE TABLET BY MOUTH EVERY OTHER DAY AS NEEDED FOR ERECTILE DYSFUNCTION  Current Outpatient Medications (Respiratory):    diphenhydrAMINE  (BENADRYL ) 50 MG tablet, Pt is also to take 50 mg of benadryl  on 09/03/2024 at 8:10 am. Please call [phone] with any questions.   promethazine  (PHENERGAN ) 25 MG tablet, Take 1 tablet (25 mg total) by mouth every 8 (eight) hours as needed for nausea or vomiting.  Current Outpatient Medications (Analgesics):    aspirin  EC 81 MG tablet, Take 81 mg by mouth daily. Swallow whole.   celecoxib  (CELEBREX ) 200 MG capsule, TAKE ONE CAPSULE BY MOUTH TWICE A DAY  Current Outpatient Medications (Other):    Accu-Chek FastClix Lancets MISC, USE TO TEST BLOOD GLUCOSE BEFORE BREAKFAST AND SUPPER   ACCU-CHEK GUIDE test strip, USE TO TEST BLOOD GLUCOSE BEFORE BREAKFAST AND SUPPER   ALPRAZolam  (XANAX ) 0.5 MG tablet, TAKE ONE TABLET BY MOUTH THREE TIMES A DAY   buPROPion  (WELLBUTRIN  XL) 150 MG 24 hr tablet, TAKE ONE TABLET BY MOUTH ONE TIME DAILY   gabapentin  (NEURONTIN ) 300 MG capsule, Take 1 capsule (300 mg total) by mouth at bedtime.   hydrocortisone  (PROCTOZONE -HC) 2.5 % rectal cream, Place 1 Application rectally 2 (two) times daily.   ondansetron  (ZOFRAN ) 4 MG tablet, Take 1 tablet (4 mg total) by mouth every 8 (eight) hours as needed for nausea or vomiting.   pantoprazole  (PROTONIX ) 40 MG tablet, TAKE ONE TABLET BY MOUTH ONE TIME DAILY   Reviewed prior external information including notes and imaging from  primary care provider As well as notes that were available from care everywhere and other healthcare systems.  Past medical history, social, surgical and family history all reviewed in electronic medical record.  No pertanent information unless stated regarding to the chief complaint.   Review of Systems:  No  headache, visual changes, nausea, vomiting, diarrhea, constipation, dizziness, abdominal pain, skin rash, fevers, chills, night sweats, weight loss, swollen lymph nodes, body aches, joint swelling, chest pain, shortness of breath, mood changes. POSITIVE muscle aches  Objective  Blood pressure 116/72, pulse 81, height 5' 11 (1.803 m), weight 208 lb (94.3 kg), SpO2 96%.   General: No apparent distress alert and oriented x3 mood and affect normal, dressed appropriately.  HEENT: Pupils equal, extraocular movements intact  Respiratory: Patient's speak in full sentences and does not appear short of breath  Cardiovascular: No lower extremity edema, non tender, no erythema  Neck exam shows loss of lordosis noted.  Patient does have some limited sidebending and extension of the neck noted. Strength in the upper extremities    Impression and Recommendations:     The above documentation has been reviewed and is accurate and complete Arthea CHRISTELLA Sharps, DO       [1]  Allergies Allergen Reactions   Miralax  [Polyethylene Glycol (Macrogol)] Other (See Comments)    Caused severe abdominal pain with bowel prep ( 238 grams )    Shellfish Allergy Nausea And Vomiting   Iohexol  Rash     Code: RASH, Desc: PER MARY SILER (TECH @ [facility])STATES PT HAD REACTION TO CONTRAST MANY YRS PRIOR. 01/05/09/RM, Onset Date: [id number]    "

## 2024-12-31 ENCOUNTER — Ambulatory Visit: Admitting: Family Medicine

## 2024-12-31 VITALS — BP 116/72 | HR 81 | Ht 71.0 in | Wt 208.0 lb

## 2024-12-31 DIAGNOSIS — M5412 Radiculopathy, cervical region: Secondary | ICD-10-CM | POA: Diagnosis not present

## 2024-12-31 DIAGNOSIS — M4802 Spinal stenosis, cervical region: Secondary | ICD-10-CM | POA: Diagnosis not present

## 2024-12-31 MED ORDER — GABAPENTIN 300 MG PO CAPS: 300.0000 mg | ORAL_CAPSULE | Freq: Every day | ORAL | 0 refills | Status: AC

## 2024-12-31 NOTE — Assessment & Plan Note (Addendum)
 Known spinal stenosis, had 3 weeks where he did improve his neck pain as well as his ringing in his ears.  Would like to consider another epidural to see how patient responds.  Concern with the amount of stenosis at the C3-C4.  May need surgical intervention.  Attempted some muscle energy techniques on the neck.  Discussed icing regimen.  Follow-up again in 6 to 12 weeks otherwise.  Gabapentin  300 mg prescribed at night

## 2024-12-31 NOTE — Patient Instructions (Addendum)
 Epidural C7-T1 [phone] Gabapentin  300mg  at night sEe me in 2 months

## 2025-01-09 ENCOUNTER — Telehealth: Payer: Self-pay

## 2025-01-09 MED ORDER — PREDNISONE 50 MG PO TABS: ORAL_TABLET | ORAL | 0 refills | Status: AC

## 2025-01-09 NOTE — Telephone Encounter (Signed)
 13 hr prep for appt on 1/22 @ 1230. Med times 2330, 0530, 1030. Pt verbalizes understanding.

## 2025-01-13 ENCOUNTER — Other Ambulatory Visit: Payer: Self-pay | Admitting: Internal Medicine

## 2025-01-13 ENCOUNTER — Encounter: Payer: Self-pay | Admitting: Internal Medicine

## 2025-01-14 ENCOUNTER — Telehealth: Payer: Self-pay

## 2025-01-14 ENCOUNTER — Other Ambulatory Visit: Payer: Self-pay

## 2025-01-14 ENCOUNTER — Encounter: Payer: Self-pay | Admitting: Internal Medicine

## 2025-01-14 ENCOUNTER — Ambulatory Visit: Admitting: Internal Medicine

## 2025-01-14 VITALS — BP 102/70 | HR 76 | Temp 98.1°F | Ht 71.0 in | Wt 215.0 lb

## 2025-01-14 DIAGNOSIS — M4802 Spinal stenosis, cervical region: Secondary | ICD-10-CM

## 2025-01-14 DIAGNOSIS — I10 Essential (primary) hypertension: Secondary | ICD-10-CM

## 2025-01-14 DIAGNOSIS — R202 Paresthesia of skin: Secondary | ICD-10-CM

## 2025-01-14 DIAGNOSIS — E119 Type 2 diabetes mellitus without complications: Secondary | ICD-10-CM

## 2025-01-14 DIAGNOSIS — R200 Anesthesia of skin: Secondary | ICD-10-CM | POA: Diagnosis not present

## 2025-01-14 MED ORDER — SEMAGLUTIDE (2 MG/DOSE) 8 MG/3ML ~~LOC~~ SOPN: 2.0000 mg | PEN_INJECTOR | SUBCUTANEOUS | 3 refills | Status: AC

## 2025-01-14 NOTE — Progress Notes (Addendum)
 "   Patient Care Team: Perri Ronal PARAS, MD as PCP - General (Internal Medicine) Delford Maude BROCKS, MD as PCP - Cardiology (Cardiology)  Visit Date: 01/14/25  Subjective:    Patient ID: [name] [name] , Male   DOB: 14-Apr-1956, 69 y.o.    MRN: [id number]   69 y.o. Male presents today for  Tingling and numbness on left side of face. Patient has a  history of  Type 2 Diabetes mellitus,  Hypertension,  and Hyperlipidemia.  He has been having left sided facial tingling and numbness for ten days. He said that he feels it for less than an hour. Denies any facial drooping. Denied numbness and tingling in the hands. Discussed Neurology referral.   Cervical Radiculopathy treated with epidural steroid injections and Gabapentin  300 mg at bedtime. Followed by Dr. Arthea Sharps at [facility].  08/27/2024 MR cervical spine Multilevel cervical spondylosis with resultant diffuse spinal stenosis at C3-4 through C6-7, moderate to severe in nature at C3-4 and C4-5. 2. Multifactorial degenerative changes with resultant multilevel foraminal narrowing as above. Notable findings include severe right with moderate left C4 foraminal stenosis, severe left C5 foraminal narrowing, severe left with moderate right C6 foraminal stenosis, with moderate bilateral C7 foraminal narrowing. 3. Mild reactive marrow edema about the right C5-6 facet due to facet arthritis. Finding could serve as a source for neck pain.    Past Medical History:  Diagnosis Date   Anxiety    mild- past  hx   Arthritis    Rt hip   Cancer (HCC) 04/2019   Skin Rt hand. Dr. Joshua   Diabetes mellitus without complication (HCC)    GERD (gastroesophageal reflux disease)    Tums or omeprozole   Heart murmur    History of IBS    History of kidney stones    30 years ago   Hypertension 2009   Meds controlled     Family History  Problem Relation Age of Onset   Breast cancer Mother    Prostate cancer Father    Cancer Father    Stroke  Father    Colon cancer Neg Hx    Colon polyps Neg Hx    Esophageal cancer Neg Hx    Stomach cancer Neg Hx    Rectal cancer Neg Hx    Family History Narrative: Father died with complications of prostate cancer. Mother died with complications of dementia. 1 brother.      Social History Narrative:   Non-smoker.  Social alcohol consumption.  Married.  Wife retired from  a chartered loss adjuster.  He has 1 adult son from previous marriage.  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 He continues to enjoy horseback riding  and exhibiting Cutting horses in competitions.    ROS: no headache, nausea, visual disturbance.        Objective:   Vitals: BP 102/70   Pulse 76   Temp 98.1 F (36.7 C)   Ht 5' 11 (1.803 m)   Wt 215 lb (97.5 kg)   SpO2 96%   BMI 29.99 kg/m    Physical Exam Vitals and nursing note reviewed.  Constitutional:      General: He is not in acute distress.    Appearance: Normal appearance. He is not ill-appearing.  HENT:     Head: Normocephalic and atraumatic.  Eyes:     Pupils: Pupils are equal, round, and reactive to light.  Neck:     Thyroid: No thyroid  mass, thyromegaly or thyroid tenderness.  Cardiovascular:     Rate and Rhythm: Normal rate and regular rhythm.     Pulses: Normal pulses.     Heart sounds: Normal heart sounds. No murmur heard.    No friction rub. No gallop.  Pulmonary:     Effort: Pulmonary effort is normal. No respiratory distress.     Breath sounds: Normal breath sounds. No wheezing or rales.  Lymphadenopathy:     Cervical: No cervical adenopathy.  Skin:    General: Skin is warm and dry.  Neurological:     Mental Status: He is alert and oriented to person, place, and time. Mental status is at baseline.     Deep Tendon Reflexes:     Reflex Scores:      Bicep reflexes are 2+ on the right side and 2+ on the left side.      Patellar reflexes are 2+ on the right side and 2+ on the left side. Psychiatric:        Mood  and Affect: Mood normal.        Behavior: Behavior normal.        Thought Content: Thought content normal.        Judgment: Judgment normal.       Results:   Studies obtained and personally reviewed by me:  08/27/2024 MR cervical spine Multilevel cervical spondylosis with resultant diffuse spinal stenosis at C3-4 through C6-7, moderate to severe in nature at C3-4 and C4-5. 2. Multifactorial degenerative changes with resultant multilevel foraminal narrowing as above. Notable findings include severe right with moderate left C4 foraminal stenosis, severe left C5 foraminal narrowing, severe left with moderate right C6 foraminal stenosis, with moderate bilateral C7 foraminal narrowing. 3. Mild reactive marrow edema about the right C5-6 facet due to facet arthritis. Finding could serve as a source for neck pain.  Labs:       Component Value Date/Time   NA 139 09/30/2024 1002   NA 139 04/08/2021 1401   K 3.6 09/30/2024 1002   CL 102 09/30/2024 1002   CO2 28 09/30/2024 1002   GLUCOSE 105 (H) 09/30/2024 1002   BUN 15 09/30/2024 1002   BUN 16 04/08/2021 1401   CREATININE 1.05 09/30/2024 1002   CALCIUM  9.4 09/30/2024 1002   PROT 7.0 09/30/2024 1002   ALBUMIN 4.2 08/16/2016 1147   AST 24 09/30/2024 1002   ALT 27 09/30/2024 1002   ALKPHOS 44 08/16/2016 1147   BILITOT 0.7 09/30/2024 1002   GFRNONAA 89 05/17/2021 1143   GFRAA 103 05/17/2021 1143     Lab Results  Component Value Date   WBC 4.5 09/30/2024   HGB 14.7 09/30/2024   HCT 42.4 09/30/2024   MCV 93.8 09/30/2024   PLT 168 09/30/2024    Lab Results  Component Value Date   CHOL 132 09/30/2024   HDL 53 09/30/2024   LDLCALC 58 09/30/2024   TRIG 133 09/30/2024   CHOLHDL 2.5 09/30/2024    Lab Results  Component Value Date   HGBA1C 5.6 09/30/2024      Lab Results  Component Value Date   PSA 1.69 09/30/2024   PSA 1.37 07/17/2023   PSA 1.81 07/11/2022     Assessment & Plan:   Paresthesias left face :He has been  having left sided facial tingling and numbing for ten days. He said that he feels it for less than an hour. Denies any facial drooping. Denied numbness and tingling in the hands. Discussed Neurology referral.  Advised  patient to call Dr.  Arthea Sharps and speak with him since he has been seeing pt for cervical radiculopathy.  Cervical Radiculopathy: treated with epidural steroid injections and Gabapentin  300 mg at bedtime. Followed by Dr. Arthea Sharps at [facility].  08/27/2024 MR cervical spine Multilevel cervical spondylosis with resultant diffuse spinal stenosis at C3-4 through C6-7, moderate to severe in nature at C3-4 and C4-5. 2. Multifactorial degenerative changes with resultant multilevel foraminal narrowing as above. Notable findings include severe right with moderate left C4 foraminal stenosis, severe left C5 foraminal narrowing, severe left with moderate right C6 foraminal stenosis, with moderate bilateral C7 foraminal narrowing. 3. Mild reactive marrow edema about the right C5-6 facet due to facet arthritis. Findings could serve as a source for neck pain.  Have suggested patient speak with Dr. Zach Smith about this new issue as patient has been getting cervical epidural injections. I am concerned this may be a different issue. Have spoken with patient about Neurology consult but I think he should speak with Dr. Sharps first.   [name] [name],acting as a scribe for Ronal JINNY Hailstone, MD.,have documented all relevant documentation on the behalf of Ronal JINNY Hailstone, MD,as directed by  Ronal JINNY Hailstone, MD while in the presence of Ronal JINNY Hailstone, MD.  I, Ronal JINNY Hailstone, MD, have reviewed all documentation for this visit. The documentation on 01/14/2025 for the exam, diagnosis, procedures, and orders are all accurate and complete.   Addendum: Patient had MRI of brain o January 23 showing no abnormality to explain facial tingling. I have spoken with patient on Jnuary 29th by phone. He hopes to  see Dr. Delford in the near future. Says  facial tingling symptoms have not completely resolved but less frequent. I have suggested Neurology consult and patient agrees. Will put in referral.   "

## 2025-01-14 NOTE — Telephone Encounter (Signed)
 Per a verbal from Dr. Claudene patient can get STAT MRI Brain or go to ED for a CT. Patient would like to do MRI. Order placed. Recommended that patient go into ED if symptoms worsen. Patient voices understanding.

## 2025-01-14 NOTE — Telephone Encounter (Signed)
 Patient and wife called. Onset 10 days ago of tingling in L side of face from cheek to lips. No injury to area. No hx of similar symptoms. No pattern to increase and decrease of symptoms but tingling does seem to wax and wane. Continues to have B tinnitus. Contacted PCP who recommended contacting us . She recommended MRI and neurology referral. Please advise.

## 2025-01-15 ENCOUNTER — Telehealth: Payer: Self-pay

## 2025-01-15 NOTE — Discharge Instructions (Signed)

## 2025-01-16 ENCOUNTER — Ambulatory Visit
Admission: RE | Admit: 2025-01-16 | Discharge: 2025-01-16 | Disposition: A | Discharge status: Home / Self Care | Source: Ambulatory Visit | Attending: Family Medicine | Admitting: Family Medicine

## 2025-01-16 DIAGNOSIS — M5412 Radiculopathy, cervical region: Secondary | ICD-10-CM

## 2025-01-16 MED ORDER — TRIAMCINOLONE ACETONIDE 40 MG/ML IJ SUSP (RADIOLOGY)
60.0000 mg | Freq: Once | INTRAMUSCULAR | Status: AC
  Administered 2025-01-16: 60 mg via EPIDURAL

## 2025-01-16 MED ORDER — IOPAMIDOL (ISOVUE-M 300) INJECTION 61%
1.0000 mL | Freq: Once | INTRAMUSCULAR | Status: AC | PRN
  Administered 2025-01-16: 1 mL via EPIDURAL

## 2025-01-16 MED ORDER — IOPAMIDOL (ISOVUE-M 300) INJECTION 61%: 15.0000 mL | Freq: Once | INTRAMUSCULAR | Status: DC | PRN

## 2025-01-17 ENCOUNTER — Ambulatory Visit: Payer: Self-pay | Admitting: Family Medicine

## 2025-01-17 ENCOUNTER — Ambulatory Visit
Admission: RE | Admit: 2025-01-17 | Discharge: 2025-01-17 | Disposition: A | Source: Ambulatory Visit | Attending: Family Medicine | Admitting: Family Medicine

## 2025-01-17 DIAGNOSIS — R202 Paresthesia of skin: Secondary | ICD-10-CM

## 2025-01-23 DIAGNOSIS — R200 Anesthesia of skin: Secondary | ICD-10-CM | POA: Insufficient documentation

## 2025-01-23 NOTE — Patient Instructions (Addendum)
 Patient will speak with Dr. Darlyn Sharps about his symptoms. Consider neurology consult if not improving within a few days.  Spoke with patient by phone Jan 29th. Still having some paresthesias of face- maybe not as frequent. He is agreeable to neurology consult. Referral placed. 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

## 2025-02-25 ENCOUNTER — Ambulatory Visit: Admitting: Internal Medicine

## 2025-03-11 ENCOUNTER — Ambulatory Visit: Admitting: Family Medicine

## 2025-05-06 ENCOUNTER — Ambulatory Visit: Admitting: Cardiovascular Disease
